# Patient Record
Sex: Male | Born: 1975 | Race: Black or African American | Hispanic: No | Marital: Single | Smoking: Never smoker
Health system: Southern US, Community
[De-identification: ages and names within clinical notes are randomized; demographics above are authoritative.]

## PROBLEM LIST (undated history)

## (undated) DIAGNOSIS — B20 Human immunodeficiency virus [HIV] disease: Secondary | ICD-10-CM

## (undated) DIAGNOSIS — F1910 Other psychoactive substance abuse, uncomplicated: Secondary | ICD-10-CM

## (undated) DIAGNOSIS — F32A Depression, unspecified: Secondary | ICD-10-CM

## (undated) DIAGNOSIS — F329 Major depressive disorder, single episode, unspecified: Secondary | ICD-10-CM

## (undated) HISTORY — DX: Other psychoactive substance abuse, uncomplicated: F19.10

## (undated) HISTORY — DX: Major depressive disorder, single episode, unspecified: F32.9

## (undated) HISTORY — DX: Depression, unspecified: F32.A

---

## 1997-12-03 ENCOUNTER — Encounter: Admission: RE | Admit: 1997-12-03 | Discharge: 1997-12-03 | Payer: Self-pay | Admitting: Internal Medicine

## 1997-12-23 ENCOUNTER — Encounter: Admission: RE | Admit: 1997-12-23 | Discharge: 1997-12-23 | Payer: Self-pay | Admitting: Internal Medicine

## 1998-01-25 ENCOUNTER — Emergency Department (HOSPITAL_COMMUNITY): Admission: EM | Admit: 1998-01-25 | Discharge: 1998-01-25 | Payer: Self-pay | Admitting: Emergency Medicine

## 1998-02-05 ENCOUNTER — Encounter: Admission: RE | Admit: 1998-02-05 | Discharge: 1998-02-05 | Payer: Self-pay | Admitting: Internal Medicine

## 1998-02-27 ENCOUNTER — Encounter (INDEPENDENT_AMBULATORY_CARE_PROVIDER_SITE_OTHER): Payer: Self-pay | Admitting: *Deleted

## 1998-02-27 LAB — CONVERTED CEMR LAB: CD4 Count: 20 microliters

## 1998-03-10 ENCOUNTER — Encounter: Admission: RE | Admit: 1998-03-10 | Discharge: 1998-03-10 | Payer: Self-pay | Admitting: Internal Medicine

## 1998-05-01 ENCOUNTER — Emergency Department (HOSPITAL_COMMUNITY): Admission: EM | Admit: 1998-05-01 | Discharge: 1998-05-01 | Payer: Self-pay | Admitting: Emergency Medicine

## 1998-06-03 ENCOUNTER — Ambulatory Visit (HOSPITAL_COMMUNITY): Admission: RE | Admit: 1998-06-03 | Discharge: 1998-06-03 | Payer: Self-pay | Admitting: Internal Medicine

## 1998-07-14 ENCOUNTER — Encounter: Admission: RE | Admit: 1998-07-14 | Discharge: 1998-07-14 | Payer: Self-pay | Admitting: Internal Medicine

## 1998-09-29 ENCOUNTER — Ambulatory Visit (HOSPITAL_COMMUNITY): Admission: RE | Admit: 1998-09-29 | Discharge: 1998-09-29 | Payer: Self-pay | Admitting: Internal Medicine

## 1998-09-29 ENCOUNTER — Encounter: Admission: RE | Admit: 1998-09-29 | Discharge: 1998-09-29 | Payer: Self-pay | Admitting: Internal Medicine

## 1998-10-21 ENCOUNTER — Encounter: Admission: RE | Admit: 1998-10-21 | Discharge: 1998-10-21 | Payer: Self-pay | Admitting: Internal Medicine

## 1998-12-29 ENCOUNTER — Ambulatory Visit (HOSPITAL_COMMUNITY): Admission: RE | Admit: 1998-12-29 | Discharge: 1998-12-29 | Payer: Self-pay | Admitting: Internal Medicine

## 1999-01-12 ENCOUNTER — Encounter: Admission: RE | Admit: 1999-01-12 | Discharge: 1999-01-12 | Payer: Self-pay | Admitting: Internal Medicine

## 1999-03-24 ENCOUNTER — Ambulatory Visit (HOSPITAL_COMMUNITY): Admission: RE | Admit: 1999-03-24 | Discharge: 1999-03-24 | Payer: Self-pay | Admitting: Internal Medicine

## 1999-03-24 ENCOUNTER — Encounter: Admission: RE | Admit: 1999-03-24 | Discharge: 1999-03-24 | Payer: Self-pay | Admitting: Internal Medicine

## 1999-04-07 ENCOUNTER — Encounter: Admission: RE | Admit: 1999-04-07 | Discharge: 1999-04-07 | Payer: Self-pay | Admitting: Internal Medicine

## 1999-08-22 ENCOUNTER — Emergency Department (HOSPITAL_COMMUNITY): Admission: EM | Admit: 1999-08-22 | Discharge: 1999-08-22 | Payer: Self-pay | Admitting: Emergency Medicine

## 1999-08-25 ENCOUNTER — Encounter: Admission: RE | Admit: 1999-08-25 | Discharge: 1999-08-25 | Payer: Self-pay | Admitting: Internal Medicine

## 1999-08-25 ENCOUNTER — Ambulatory Visit (HOSPITAL_COMMUNITY): Admission: RE | Admit: 1999-08-25 | Discharge: 1999-08-25 | Payer: Self-pay | Admitting: Internal Medicine

## 1999-09-08 ENCOUNTER — Encounter: Admission: RE | Admit: 1999-09-08 | Discharge: 1999-09-08 | Payer: Self-pay | Admitting: Internal Medicine

## 2000-03-01 ENCOUNTER — Encounter: Admission: RE | Admit: 2000-03-01 | Discharge: 2000-03-01 | Payer: Self-pay | Admitting: Internal Medicine

## 2000-03-01 ENCOUNTER — Ambulatory Visit (HOSPITAL_COMMUNITY): Admission: RE | Admit: 2000-03-01 | Discharge: 2000-03-01 | Payer: Self-pay | Admitting: Internal Medicine

## 2000-03-03 ENCOUNTER — Ambulatory Visit (HOSPITAL_COMMUNITY): Admission: RE | Admit: 2000-03-03 | Discharge: 2000-03-03 | Payer: Self-pay | Admitting: Internal Medicine

## 2000-03-03 ENCOUNTER — Encounter: Admission: RE | Admit: 2000-03-03 | Discharge: 2000-03-03 | Payer: Self-pay | Admitting: Internal Medicine

## 2000-03-21 ENCOUNTER — Encounter: Admission: RE | Admit: 2000-03-21 | Discharge: 2000-03-21 | Payer: Self-pay | Admitting: Internal Medicine

## 2001-03-06 ENCOUNTER — Ambulatory Visit (HOSPITAL_COMMUNITY): Admission: RE | Admit: 2001-03-06 | Discharge: 2001-03-06 | Payer: Self-pay | Admitting: Internal Medicine

## 2001-03-06 ENCOUNTER — Encounter: Admission: RE | Admit: 2001-03-06 | Discharge: 2001-03-06 | Payer: Self-pay | Admitting: Internal Medicine

## 2001-03-28 ENCOUNTER — Encounter: Admission: RE | Admit: 2001-03-28 | Discharge: 2001-03-28 | Payer: Self-pay | Admitting: Internal Medicine

## 2001-09-04 ENCOUNTER — Ambulatory Visit (HOSPITAL_COMMUNITY): Admission: RE | Admit: 2001-09-04 | Discharge: 2001-09-04 | Payer: Self-pay | Admitting: Internal Medicine

## 2001-09-04 ENCOUNTER — Encounter: Admission: RE | Admit: 2001-09-04 | Discharge: 2001-09-04 | Payer: Self-pay | Admitting: Internal Medicine

## 2001-10-16 ENCOUNTER — Emergency Department (HOSPITAL_COMMUNITY): Admission: EM | Admit: 2001-10-16 | Discharge: 2001-10-16 | Payer: Self-pay

## 2002-03-10 ENCOUNTER — Encounter: Payer: Self-pay | Admitting: Emergency Medicine

## 2002-03-10 ENCOUNTER — Emergency Department (HOSPITAL_COMMUNITY): Admission: EM | Admit: 2002-03-10 | Discharge: 2002-03-10 | Payer: Self-pay | Admitting: Emergency Medicine

## 2002-08-07 ENCOUNTER — Encounter: Admission: RE | Admit: 2002-08-07 | Discharge: 2002-08-07 | Payer: Self-pay | Admitting: Internal Medicine

## 2002-08-07 ENCOUNTER — Ambulatory Visit (HOSPITAL_COMMUNITY): Admission: RE | Admit: 2002-08-07 | Discharge: 2002-08-07 | Payer: Self-pay | Admitting: Internal Medicine

## 2002-08-30 ENCOUNTER — Encounter: Payer: Self-pay | Admitting: Emergency Medicine

## 2002-08-30 ENCOUNTER — Emergency Department (HOSPITAL_COMMUNITY): Admission: AC | Admit: 2002-08-30 | Discharge: 2002-08-30 | Payer: Self-pay

## 2002-12-29 ENCOUNTER — Encounter: Payer: Self-pay | Admitting: Emergency Medicine

## 2002-12-29 ENCOUNTER — Emergency Department (HOSPITAL_COMMUNITY): Admission: EM | Admit: 2002-12-29 | Discharge: 2002-12-29 | Payer: Self-pay | Admitting: Emergency Medicine

## 2003-09-12 ENCOUNTER — Encounter: Admission: RE | Admit: 2003-09-12 | Discharge: 2003-09-12 | Payer: Self-pay | Admitting: Internal Medicine

## 2003-09-24 ENCOUNTER — Encounter: Admission: RE | Admit: 2003-09-24 | Discharge: 2003-09-24 | Payer: Self-pay | Admitting: Internal Medicine

## 2003-10-22 ENCOUNTER — Encounter: Admission: RE | Admit: 2003-10-22 | Discharge: 2003-10-22 | Payer: Self-pay | Admitting: Internal Medicine

## 2004-01-07 ENCOUNTER — Encounter: Admission: RE | Admit: 2004-01-07 | Discharge: 2004-01-07 | Payer: Self-pay | Admitting: Internal Medicine

## 2004-01-21 ENCOUNTER — Encounter: Admission: RE | Admit: 2004-01-21 | Discharge: 2004-01-21 | Payer: Self-pay | Admitting: Internal Medicine

## 2004-09-14 ENCOUNTER — Ambulatory Visit (HOSPITAL_COMMUNITY): Admission: RE | Admit: 2004-09-14 | Discharge: 2004-09-14 | Payer: Self-pay | Admitting: Internal Medicine

## 2004-09-14 ENCOUNTER — Encounter (INDEPENDENT_AMBULATORY_CARE_PROVIDER_SITE_OTHER): Payer: Self-pay | Admitting: *Deleted

## 2004-09-14 ENCOUNTER — Ambulatory Visit: Payer: Self-pay | Admitting: Internal Medicine

## 2004-09-14 LAB — CONVERTED CEMR LAB: HIV 1 RNA Quant: 399 copies/mL

## 2004-09-28 ENCOUNTER — Ambulatory Visit: Payer: Self-pay | Admitting: Internal Medicine

## 2004-11-11 ENCOUNTER — Emergency Department (HOSPITAL_COMMUNITY): Admission: EM | Admit: 2004-11-11 | Discharge: 2004-11-11 | Payer: Self-pay | Admitting: General Surgery

## 2005-03-30 ENCOUNTER — Ambulatory Visit: Payer: Self-pay | Admitting: Internal Medicine

## 2005-03-30 ENCOUNTER — Ambulatory Visit (HOSPITAL_COMMUNITY): Admission: RE | Admit: 2005-03-30 | Discharge: 2005-03-30 | Payer: Self-pay | Admitting: Internal Medicine

## 2005-03-30 ENCOUNTER — Encounter (INDEPENDENT_AMBULATORY_CARE_PROVIDER_SITE_OTHER): Payer: Self-pay | Admitting: *Deleted

## 2005-04-13 ENCOUNTER — Ambulatory Visit: Payer: Self-pay | Admitting: Internal Medicine

## 2006-09-16 ENCOUNTER — Encounter (INDEPENDENT_AMBULATORY_CARE_PROVIDER_SITE_OTHER): Payer: Self-pay | Admitting: *Deleted

## 2006-09-16 ENCOUNTER — Encounter: Admission: RE | Admit: 2006-09-16 | Discharge: 2006-09-16 | Payer: Self-pay | Admitting: Internal Medicine

## 2006-09-16 ENCOUNTER — Ambulatory Visit: Payer: Self-pay | Admitting: Internal Medicine

## 2006-09-16 LAB — CONVERTED CEMR LAB
AST: 24 units/L (ref 0–37)
Albumin: 4.5 g/dL (ref 3.5–5.2)
Alkaline Phosphatase: 50 units/L (ref 39–117)
BUN: 14 mg/dL (ref 6–23)
Basophils Relative: 0 % (ref 0–1)
CD4 Count: 160 microliters
Creatinine, Ser: 0.9 mg/dL (ref 0.40–1.50)
Eosinophils Relative: 1 % (ref 0–5)
Glucose, Bld: 92 mg/dL (ref 70–99)
HCT: 42.1 % (ref 39.0–52.0)
HIV 1 RNA Quant: 135 copies/mL — ABNORMAL HIGH (ref <50)
HIV-1 RNA Quant, Log: 2.13 — ABNORMAL HIGH (ref <1.70)
Hemoglobin: 14.3 g/dL (ref 13.0–17.0)
Lymphocytes Relative: 65 % — ABNORMAL HIGH (ref 12–46)
Lymphs Abs: 2.2 10*3/uL (ref 0.7–3.3)
MCV: 107.9 fL — ABNORMAL HIGH (ref 78.0–100.0)
Monocytes Absolute: 0.4 10*3/uL (ref 0.2–0.7)
Monocytes Relative: 13 % — ABNORMAL HIGH (ref 3–11)
Platelets: 364 10*3/uL (ref 150–400)
Potassium: 4.6 meq/L (ref 3.5–5.3)
RBC: 3.9 M/uL — ABNORMAL LOW (ref 4.22–5.81)
WBC: 3.4 10*3/uL — ABNORMAL LOW (ref 4.0–10.5)

## 2006-10-17 DIAGNOSIS — F17200 Nicotine dependence, unspecified, uncomplicated: Secondary | ICD-10-CM | POA: Insufficient documentation

## 2006-10-17 DIAGNOSIS — F1010 Alcohol abuse, uncomplicated: Secondary | ICD-10-CM | POA: Insufficient documentation

## 2006-10-17 DIAGNOSIS — A150 Tuberculosis of lung: Secondary | ICD-10-CM | POA: Insufficient documentation

## 2006-10-17 DIAGNOSIS — A318 Other mycobacterial infections: Secondary | ICD-10-CM | POA: Insufficient documentation

## 2006-10-17 DIAGNOSIS — F411 Generalized anxiety disorder: Secondary | ICD-10-CM | POA: Insufficient documentation

## 2006-10-17 DIAGNOSIS — B20 Human immunodeficiency virus [HIV] disease: Secondary | ICD-10-CM | POA: Insufficient documentation

## 2006-10-17 DIAGNOSIS — F329 Major depressive disorder, single episode, unspecified: Secondary | ICD-10-CM

## 2006-10-24 ENCOUNTER — Encounter (INDEPENDENT_AMBULATORY_CARE_PROVIDER_SITE_OTHER): Payer: Self-pay | Admitting: *Deleted

## 2006-10-24 LAB — CONVERTED CEMR LAB

## 2006-10-25 ENCOUNTER — Ambulatory Visit: Payer: Self-pay | Admitting: Internal Medicine

## 2006-11-06 ENCOUNTER — Encounter (INDEPENDENT_AMBULATORY_CARE_PROVIDER_SITE_OTHER): Payer: Self-pay | Admitting: *Deleted

## 2006-11-07 ENCOUNTER — Telehealth (INDEPENDENT_AMBULATORY_CARE_PROVIDER_SITE_OTHER): Payer: Self-pay | Admitting: Infectious Diseases

## 2007-02-01 ENCOUNTER — Telehealth: Payer: Self-pay | Admitting: Internal Medicine

## 2007-03-23 ENCOUNTER — Telehealth: Payer: Self-pay | Admitting: Internal Medicine

## 2007-04-04 ENCOUNTER — Telehealth: Payer: Self-pay | Admitting: Internal Medicine

## 2007-05-08 ENCOUNTER — Encounter: Admission: RE | Admit: 2007-05-08 | Discharge: 2007-05-08 | Payer: Self-pay | Admitting: Internal Medicine

## 2007-05-08 ENCOUNTER — Ambulatory Visit: Payer: Self-pay | Admitting: Internal Medicine

## 2007-05-08 LAB — CONVERTED CEMR LAB
Cholesterol: 191 mg/dL (ref 0–200)
HDL: 91 mg/dL (ref >39)
HIV 1 RNA Quant: 313 copies/mL — ABNORMAL HIGH (ref <50)
Total CHOL/HDL Ratio: 2.1
VLDL: 24 mg/dL (ref 0–40)

## 2007-05-22 ENCOUNTER — Ambulatory Visit: Payer: Self-pay | Admitting: Internal Medicine

## 2007-08-29 ENCOUNTER — Encounter: Payer: Self-pay | Admitting: Internal Medicine

## 2007-10-24 ENCOUNTER — Ambulatory Visit (HOSPITAL_COMMUNITY): Admission: RE | Admit: 2007-10-24 | Discharge: 2007-10-24 | Payer: Self-pay | Admitting: *Deleted

## 2008-04-05 ENCOUNTER — Ambulatory Visit: Payer: Self-pay | Admitting: Internal Medicine

## 2008-04-05 ENCOUNTER — Encounter: Admission: RE | Admit: 2008-04-05 | Discharge: 2008-04-05 | Payer: Self-pay | Admitting: Internal Medicine

## 2008-04-05 LAB — CONVERTED CEMR LAB
AST: 26 units/L (ref 0–37)
Alkaline Phosphatase: 51 units/L (ref 39–117)
BUN: 15 mg/dL (ref 6–23)
Creatinine, Ser: 1.01 mg/dL (ref 0.40–1.50)
HCT: 42.6 % (ref 39.0–52.0)
HDL: 80 mg/dL (ref >39)
Hemoglobin: 14.2 g/dL (ref 13.0–17.0)
LDL Cholesterol: 79 mg/dL (ref 0–99)
MCHC: 33.3 g/dL (ref 30.0–36.0)
MCV: 112.7 fL — ABNORMAL HIGH (ref 78.0–100.0)
Potassium: 4.1 meq/L (ref 3.5–5.3)
RDW: 14.2 % (ref 11.5–15.5)
Total Bilirubin: 0.5 mg/dL (ref 0.3–1.2)
Total CHOL/HDL Ratio: 2.3
Triglycerides: 104 mg/dL (ref <150)
VLDL: 21 mg/dL (ref 0–40)

## 2008-04-23 ENCOUNTER — Ambulatory Visit: Payer: Self-pay | Admitting: Internal Medicine

## 2008-05-13 ENCOUNTER — Telehealth: Payer: Self-pay | Admitting: Internal Medicine

## 2009-06-20 ENCOUNTER — Ambulatory Visit: Payer: Self-pay | Admitting: Internal Medicine

## 2009-06-20 LAB — CONVERTED CEMR LAB
ALT: 15 units/L (ref 0–53)
BUN: 16 mg/dL (ref 6–23)
Basophils Relative: 1 % (ref 0–1)
CO2: 22 meq/L (ref 19–32)
Calcium: 9.5 mg/dL (ref 8.4–10.5)
Chloride: 102 meq/L (ref 96–112)
Creatinine, Ser: 1.1 mg/dL (ref 0.40–1.50)
Eosinophils Absolute: 0 10*3/uL (ref 0.0–0.7)
Eosinophils Relative: 1 % (ref 0–5)
GC Probe Amp, Urine: NEGATIVE
HCT: 40.4 % (ref 39.0–52.0)
HIV 1 RNA Quant: 314 copies/mL — ABNORMAL HIGH (ref <48)
Hemoglobin, Urine: NEGATIVE
Leukocytes, UA: NEGATIVE
MCHC: 33.4 g/dL (ref 30.0–36.0)
MCV: 112.8 fL — ABNORMAL HIGH (ref >=78.0)
Neutrophils Relative %: 27 % — ABNORMAL LOW (ref 43–77)
Nitrite: NEGATIVE
Platelets: 291 10*3/uL (ref 150–400)
Protein, ur: NEGATIVE mg/dL
Total Bilirubin: 0.4 mg/dL (ref 0.3–1.2)
Urine Glucose: NEGATIVE mg/dL
Urobilinogen, UA: 0.2 (ref 0.0–1.0)

## 2009-07-01 ENCOUNTER — Ambulatory Visit: Payer: Self-pay | Admitting: Internal Medicine

## 2009-07-01 DIAGNOSIS — I10 Essential (primary) hypertension: Secondary | ICD-10-CM

## 2009-07-01 LAB — CONVERTED CEMR LAB
LDL Cholesterol: 100 mg/dL — ABNORMAL HIGH (ref 0–99)
Triglycerides: 124 mg/dL (ref <150)
VLDL: 25 mg/dL (ref 0–40)

## 2009-12-24 ENCOUNTER — Telehealth (INDEPENDENT_AMBULATORY_CARE_PROVIDER_SITE_OTHER): Payer: Self-pay | Admitting: *Deleted

## 2010-01-06 ENCOUNTER — Telehealth: Payer: Self-pay | Admitting: Internal Medicine

## 2010-01-16 ENCOUNTER — Encounter (INDEPENDENT_AMBULATORY_CARE_PROVIDER_SITE_OTHER): Payer: Self-pay | Admitting: *Deleted

## 2010-03-10 ENCOUNTER — Telehealth (INDEPENDENT_AMBULATORY_CARE_PROVIDER_SITE_OTHER): Payer: Self-pay | Admitting: *Deleted

## 2010-04-24 ENCOUNTER — Ambulatory Visit: Payer: Self-pay | Admitting: Internal Medicine

## 2010-04-24 LAB — CONVERTED CEMR LAB
AST: 45 units/L — ABNORMAL HIGH (ref 0–37)
Albumin: 4.1 g/dL (ref 3.5–5.2)
BUN: 10 mg/dL (ref 6–23)
Basophils Relative: 0 % (ref 0–1)
CO2: 25 meq/L (ref 19–32)
Calcium: 9.1 mg/dL (ref 8.4–10.5)
Chloride: 99 meq/L (ref 96–112)
Cholesterol: 192 mg/dL (ref 0–200)
Creatinine, Ser: 0.98 mg/dL (ref 0.40–1.50)
HDL: 80 mg/dL (ref >39)
HIV 1 RNA Quant: 12800 copies/mL — ABNORMAL HIGH (ref <48)
Hemoglobin: 14.4 g/dL (ref 13.0–17.0)
Lymphocytes Relative: 54 % — ABNORMAL HIGH (ref 12–46)
Lymphs Abs: 1.9 10*3/uL (ref 0.7–4.0)
MCHC: 34.6 g/dL (ref 30.0–36.0)
Monocytes Absolute: 0.6 10*3/uL (ref 0.1–1.0)
Monocytes Relative: 16 % — ABNORMAL HIGH (ref 3–12)
Neutro Abs: 1 10*3/uL — ABNORMAL LOW (ref 1.7–7.7)
Neutrophils Relative %: 29 % — ABNORMAL LOW (ref 43–77)
Potassium: 3.8 meq/L (ref 3.5–5.3)
RBC: 3.91 M/uL — ABNORMAL LOW (ref 4.22–5.81)
WBC: 3.5 10*3/uL — ABNORMAL LOW (ref 4.0–10.5)

## 2010-05-06 ENCOUNTER — Ambulatory Visit: Payer: Self-pay | Admitting: Internal Medicine

## 2010-05-06 LAB — CONVERTED CEMR LAB

## 2010-05-19 ENCOUNTER — Encounter: Payer: Self-pay | Admitting: Internal Medicine

## 2010-05-25 ENCOUNTER — Ambulatory Visit: Payer: Self-pay | Admitting: Internal Medicine

## 2010-07-20 ENCOUNTER — Encounter (INDEPENDENT_AMBULATORY_CARE_PROVIDER_SITE_OTHER): Payer: Self-pay | Admitting: *Deleted

## 2010-07-31 ENCOUNTER — Ambulatory Visit: Payer: Self-pay | Admitting: Internal Medicine

## 2010-08-13 ENCOUNTER — Ambulatory Visit: Payer: Self-pay | Admitting: Internal Medicine

## 2010-09-29 NOTE — Progress Notes (Signed)
Summary: Harriette Ohara to call pt.  Phone Note Call from Patient   Caller: Patient Summary of Call: Patient feeling sick and wants Dr. Orvan Falconer to call him. Patient asked for samples of his medications until he can come in to complete ADAP application. patien was told that [facility] does not have any Viracept and that it is not good to take one without the other.  Patient wants to know what to do in the meantime.  He has been out-of-meds for 2 months.  He was told what documents to bring in order to reenroll into the program.  Waiting on documentation. Initial call taken by: Paulo Fruit  BS,CPht II,MPH,  Jan 06, 2010 4:45 PM  Follow-up for Phone Call        I went into the stock room and did find samples of the Viracept.  If you want him to continue taking the Viracept and Combivir; I do have samples to give him. Follow-up by: Paulo Fruit  BS,CPht II,MPH,  Jan 06, 2010 4:48 PM  Additional Follow-up for Phone Call Additional follow up Details #1::        Please ask if he wants to be seen before his 5/24 appointment with me. If so, try to work him in with Dr. Philipp Deputy. Additional Follow-up by: Cliffton Asters MD,  Jan 08, 2010 9:13 AM     Appended Document: Harriette Ohara to call pt. tried to contact patient. Had to leave a message for patient to call me back at [phone]

## 2010-09-29 NOTE — Miscellaneous (Signed)
Summary: clinical update/ryan white [facility] app completed  Clinical Lists Changes  Observations: Added new observation of RWTITLE: B (01/16/2010 16:17) Added new observation of PAYOR: No Insurance (01/16/2010 16:17) Added new observation of AIDSDAP: Pending (01/16/2010 16:17) Added new observation of INCOMESOURCE: NONE (01/16/2010 16:17) Added new observation of HOUSEINCOME: 0  (01/16/2010 16:17) Added new observation of FINASSESSDT: 01/16/2010  (01/16/2010 16:17) Added new observation of RW VITAL STA: Active  (01/16/2010 16:17)

## 2010-09-29 NOTE — Miscellaneous (Signed)
  Clinical Lists Changes  Observations: Added new observation of YEARAIDSPOS: 2003  (07/20/2010 11:08)

## 2010-09-29 NOTE — Miscellaneous (Signed)
Summary: RW Form Update  Clinical Lists Changes  Observations: Added new observation of LATINO/HISP: Yes (05/19/2010 9:54)

## 2010-09-29 NOTE — Assessment & Plan Note (Signed)
Summary: 3WKS F/U [MKJ]   CC:  follow-up visit.  History of Present Illness: Darryl Berg is in for his follow-up visit.  He says that he has been taking his Combivir, Viracept and hydrochlorothiazide since his last visit.  He denies missing any doses.  He tells me that his girlfriend wants to get pregnant.  He says that she has tested negative for HIV repeatedly when she has donated blood.  He says that he has had unprotected sex with her but would feel "like I committed murder" if she became infected.  Preventive Screening-Counseling & Management  Alcohol-Tobacco     Alcohol drinks/day: <1     Alcohol type: beer and hard liquer     Smoking Status: never     Passive Smoke Exposure: yes  Caffeine-Diet-Exercise     Caffeine use/day: yes     Does Patient Exercise: no     Type of exercise: tennis     Exercise (avg: min/session): >60     Times/week: 3  Hep-HIV-STD-Contraception     HIV Risk: no risk noted  Safety-Violence-Falls     Seat Belt Use: 100  Comments: declined condoms      Sexual History:  currently monogamous.        Drug Use:  never.     Updated Prior Medication List: has not received Septra yet Current Allergies (reviewed today): No known allergies  Vital Signs:  Patient profile:   35 year old male Height:      66 inches (167.64 cm) Weight:      159.75 pounds (72.61 kg) BMI:     25.88 Temp:     98.4 degrees F (36.89 degrees C) oral Pulse rate:   96 / minute BP sitting:   146 / 101  (left arm) Cuff size:   regular  Vitals Entered By: Jennet Maduro RN (May 25, 2010 3:08 PM) CC: follow-up visit Is Patient Diabetic? No Pain Assessment Patient in pain? no      Nutritional Status BMI of 19 -24 = normal Nutritional Status Detail appetite "too good"  Have you ever been in a relationship where you felt threatened, hurt or afraid?No   Does patient need assistance? Functional Status Self care Ambulation Normal Comments no missed doses of HIV  rxes   Physical Exam  General:  alert and well-nourished.   Mouth:  good dentition and pharynx pink and moist.   Lungs:  normal breath sounds.  no crackles and no wheezes.   Heart:  normal rate, regular rhythm, and no murmur.   Skin:  no rashes.   Axillary Nodes:  no R axillary adenopathy and no L axillary adenopathy.   Psych:  normally interactive, good eye contact, and not depressed appearing but slightly anxious and worried.          Medication Adherence: 05/25/2010   Adherence to medications reviewed with patient. Counseling to provide adequate adherence provided   Prevention For Positives: 05/25/2010   Safe sex practices discussed with patient. Condoms offered.                              Impression & Recommendations:  Problem # 1:  HIV DISEASE (ICD-042) I have reviewed with him again the need to switch regimens but due to his evolving resistance.  It is possible that he has NNRTI resistance that is not currently been manifested by his virus but I will try a regimen of Atripla and Retrovir. I  discussed the risk of HIV transmission to his girlfriend unprotected sex.  That is a particular concern now that his viral load has increased.  If she does become infected it will be with his resistant virus.  I have suggested that he not have unprotected sex now and talk to his girlfriend about this.  Have also suggested that she be tested regularly at a doctor's office and not to donate blood. His updated medication list for this problem includes:    Septra Ds 800-160 Mg Tabs (Sulfamethoxazole-trimethoprim) .Marland Kitchen... 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  I will add diltiazem. His updated medication list for this problem includes:    Hydrochlorothiazide 50 Mg Tabs (Hydrochlorothiazide) .Marland Kitchen... Take 1 tablet by mouth once a day    Diltiazem Hcl 60 Mg Tabs (Diltiazem hcl) .Marland Kitchen... Take 1 tablet by mouth two times a day  Orders: Est. Patient Level IV (45409)  Medications Added to Medication List This Visit: 1)  Atripla 600-200-300 Mg Tabs (Efavirenz-emtricitab-tenofovir) .... Take 1 tablet by mouth once a day 2)  Retrovir 300 Mg Tabs (Zidovudine) .... Take 1 tablet by mouth twice a day 3)  Diltiazem Hcl 60 Mg Tabs (Diltiazem hcl) .... Take 1 tablet by mouth two times a day  Other Orders: Future Orders: T-CD4SP ([facility]) (CD4SP) ... 07/06/2010 T-HIV Viral Load [phone]) ... 07/06/2010  Patient Instructions: 1)  Please schedule a follow-up appointment in 2 months. Prescriptions: DILTIAZEM HCL 60 MG TABS (DILTIAZEM HCL) Take 1 tablet by mouth two times a day  #60 x 11   Entered and Authorized by:   Cliffton Asters MD   Signed by:   Cliffton Asters MD on 05/25/2010   Method used:   Print then Give to Patient   RxID:   [id number] RETROVIR 300 MG TABS (ZIDOVUDINE) Take 1 tablet by mouth twice a day  #60 x 11   Entered and Authorized by:   Cliffton Asters MD   Signed by:   Cliffton Asters MD on 05/25/2010   Method used:   Print then Give to Patient   RxID:   [id number] ATRIPLA 600-200-300 MG TABS (EFAVIRENZ-EMTRICITAB-TENOFOVIR) Take 1 tablet by mouth once a day  #30 x 11   Entered and Authorized by:   Cliffton Asters MD   Signed by:   Cliffton Asters MD on 05/25/2010   Method used:   Print then Give to Patient   RxID:   [id number]

## 2010-09-29 NOTE — Miscellaneous (Signed)
Summary: Orders Update  Clinical Lists Changes  Orders: Added new Test order of T-Comprehensive Metabolic Panel [phone]) - Signed Added new Test order of T-CD4 [phone]) - Signed Added new Test order of T-CBC w/Diff [phone]) - Signed Added new Test order of T-Lipid Profile ([id number]) - Signed Added new Test order of T-HIV Viral Load [phone]) - Signed Added new Test order of T-Syphilis Test (RPR) ([id number]) - Signed

## 2010-09-29 NOTE — Progress Notes (Signed)
Summary: new scripts--[facility]  Phone Note From Pharmacy   Caller: [facility] [address] [facility] Details for Reason: Need new scripts Summary of Call: Received a phone call from [facility] requesting new prescriptions for patients 042 meds be called into the [facility] store.  His prescriptions were not transfered from [facility]. Initial call taken by: Paulo Fruit  BS,CPht II,MPH,  March 10, 2010 4:13 PM    Prescriptions: VIRACEPT 625 MG TABS (NELFINAVIR MESYLATE) Take 2 tablets by mouth two times a day  #120 Tablet x 2   Entered by:   Paulo Fruit  BS,CPht II,MPH   Authorized by:   Cliffton Asters MD   Signed by:   Paulo Fruit  BS,CPht II,MPH on 03/10/2010   Method used:   Telephoned to ...       [facility] [phone]* (retail)       [address]       [place], [place]  [address]       Ph: [phone]       Fax:    RxID:   [id number] COMBIVIR 150-300 MG TABS (LAMIVUDINE-ZIDOVUDINE) Take 1 tablet by mouth two times a day  #60 Tablet x 2   Entered by:   Paulo Fruit  BS,CPht II,MPH   Authorized by:   Cliffton Asters MD   Signed by:   Paulo Fruit  BS,CPht II,MPH on 03/10/2010   Method used:   Telephoned to ...       [facility] [phone]* (retail)       [address]       [place], [place]  [address]       Ph: [phone]       Fax:    RxID:   [id number]  Left message on physician pharmacy line. Paulo Fruit  BS,CPht II,MPH  March 10, 2010 4:17 PM

## 2010-09-29 NOTE — Progress Notes (Signed)
Summary: Assistance w/medication  Phone Note Call from Patient   Caller: Patient Details for Reason: lost insurance Summary of Call: Patient c/o lost insurance December 06, 2009.  Need to come in and reapply for NCADAP.  An appt has been scheduled for Tuesday, Dec 30, 2009 at 2pm. Initial call taken by: Paulo Fruit  BS,CPht II,MPH,  December 24, 2009 4:44 PM

## 2010-09-29 NOTE — Assessment & Plan Note (Signed)
Summary: reshfrom 7/12 f/u [mkj]   CC:  follow-up visit.  History of Present Illness: Darryl Berg is in for his first visit since last November.  He lost his job and her medical insurance in April and recalls being off of his HIV medicines for at least one month.  It appears he was able to obtain some samples from [facility] in May and new ADAP prescriptions in July. He  initially told me the he did not recall missing any doses but then says that yes on weekends he will frequently missed when he is out playing or gets up late.  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  He is sexually active with monogamous partner but says that he always uses condoms.  He is given more condoms today.  Preventive Screening-Counseling & Management  Alcohol-Tobacco     Alcohol drinks/day: <1     Alcohol type: beer and hard liquer     Smoking Status: never     Passive Smoke Exposure: yes  Caffeine-Diet-Exercise     Caffeine use/day: yes     Does Patient Exercise: no     Type of exercise: tennis     Exercise (avg: min/session): >60     Times/week: 3  Hep-HIV-STD-Contraception     HIV Risk: no risk noted  Safety-Violence-Falls     Seat Belt Use: 100      Sexual History:  currently monogamous.        Drug Use:  never.     Prior Medication List:  COMBIVIR 150-300 MG TABS (LAMIVUDINE-ZIDOVUDINE) Take 1 tablet by mouth two times a day VIRACEPT 625 MG TABS (NELFINAVIR MESYLATE) Take 2 tablets by mouth two times a day   Current Allergies (reviewed today): No known allergies  Social History: Sexual History:  currently monogamous  Vital Signs:  Patient profile:   35 year old male Height:      66 inches (167.64 cm) Weight:      160.5 pounds (72.95 kg) BMI:     26.00 Temp:     98.7 degrees F (37.06 degrees C) oral Pulse rate:   92 / minute BP sitting:   154 / 104  (left arm) Cuff  size:   regular  Vitals Entered By: Jennet Maduro RN (May 06, 2010 2:41 PM) CC: follow-up visit Is Patient Diabetic? No Pain Assessment Patient in pain? no      Nutritional Status BMI of 19 -24 = normal Nutritional Status Detail appetite "GOOD"  Have you ever been in a relationship where you felt threatened, hurt or afraid?No   Does patient need assistance? Functional Status Self care Ambulation Normal Comments MAY HAVE MISSED A FEW DOSES, PROBLEM AFTER BEIGN LAID OFF FROM WORK AND LOOSING INSURANCE   Physical Exam  General:  alert and well-nourished.   Mouth:  good dentition and pharynx pink and moist.   Lungs:  normal breath sounds.  no crackles and no wheezes.   Heart:  normal rate, regular rhythm, and no murmur.   Abdomen:  soft, non-tender, normal bowel sounds, and no masses.   Skin:  no rashes.   Axillary Nodes:  no R axillary adenopathy and no L axillary adenopathy.   Psych:  normally interactive, good eye contact, not anxious appearing, and not depressed appearing.      Impression & Recommendations:  Problem #  1:  HIV DISEASE (ICD-042) His load is now elevated and his CD4 count dropped below 200.  I suspect that he has been missing doses and has developed resistance.  I have ordered a genotype and will have him return soon to see if he needs a new salvage regimen.  He is to continue his current regimen with no misses until that time. I will restart PCP prophylaxis and he will get influenza vaccination today. 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 Take 1 tablet by mouth once a day  Problem # 2:  HYPERTENSION (ICD-401.9) His blood pressure  continues to rise.  I talked to him about trying to cut sodium out of his diet.  I will start him on hydrochlorothiazide today. His updated medication list for this problem includes:    Hydrochlorothiazide 50 Mg Tabs (Hydrochlorothiazide) .Marland Kitchen... Take 1 tablet by mouth once a day  Orders: T-HIV Genotype ([id number])  BP today: 154/104 Prior BP: 152/94 (07/01/2009)  Labs Reviewed: K+: 3.8 (04/24/2010) Creat: : 0.98 (04/24/2010)   Chol: 192 (04/24/2010)   HDL: 80 (04/24/2010)   LDL: 84 (04/24/2010)   TG: 141 (04/24/2010)  Medications Added to Medication List This Visit: 1)  Septra Ds 800-160 Mg Tabs (Sulfamethoxazole-trimethoprim) .... Take 1 tablet by mouth once a day 2)  Hydrochlorothiazide 50 Mg Tabs (Hydrochlorothiazide) .... Take 1 tablet by mouth once a day  Other Orders: Est. Patient Level IV (14782)  Patient Instructions: 1)  Please schedule a follow-up appointment in 3 weeks.  Prescriptions: HYDROCHLOROTHIAZIDE 50 MG TABS (HYDROCHLOROTHIAZIDE) Take 1 tablet by mouth once a day  #30 x 11   Entered and Authorized by:   Cliffton Asters MD   Signed by:   Cliffton Asters MD on 05/06/2010   Method used:   Print then Give to Patient   RxID:   [id number] SEPTRA DS 800-160 MG TABS (SULFAMETHOXAZOLE-TRIMETHOPRIM) Take 1 tablet by mouth once a day  #30 x 11   Entered and Authorized by:   Cliffton Asters MD   Signed by:   Cliffton Asters MD on 05/06/2010   Method used:   Print then Give to Patient   RxID:   [id number]   Appended Document: reshfrom 7/12 f/u - Flu vaccine   Influenza Vaccine    Vaccine Type: Fluvax Non-MCR    Site: left deltoid    Mfr: novartis    Dose: 0.5 ml    Route: IM    Given by: Jennet Maduro RN    Exp. Date: 11/29/2010    Lot #: [id number]  Flu Vaccine Consent Questions    Do you have a history of severe allergic reactions to this vaccine? no    Any prior history of allergic reactions to egg and/or gelatin? no    Do you have a sensitivity to the  preservative Thimersol? no    Do you have a past history of Guillan-Barre Syndrome? no    Do you currently have an acute febrile illness? no    Have you ever had a severe reaction to latex? no    Vaccine information given and explained to patient? yes

## 2010-10-23 ENCOUNTER — Other Ambulatory Visit (INDEPENDENT_AMBULATORY_CARE_PROVIDER_SITE_OTHER): Payer: Self-pay

## 2010-10-23 ENCOUNTER — Encounter: Payer: Self-pay | Admitting: Internal Medicine

## 2010-10-23 ENCOUNTER — Other Ambulatory Visit: Payer: Self-pay | Admitting: Internal Medicine

## 2010-10-23 DIAGNOSIS — B20 Human immunodeficiency virus [HIV] disease: Secondary | ICD-10-CM

## 2010-10-23 LAB — T-HELPER CELL (CD4) - (RCID CLINIC ONLY)
CD4 % Helper T Cell: 9 % — ABNORMAL LOW (ref 33–55)
CD4 T Cell Abs: 180 uL — ABNORMAL LOW (ref 400–2700)

## 2010-10-23 LAB — CONVERTED CEMR LAB
HIV 1 RNA Quant: 153 copies/mL — ABNORMAL HIGH (ref <20)
HIV-1 RNA Quant, Log: 2.18 — ABNORMAL HIGH (ref <1.30)

## 2010-11-10 ENCOUNTER — Ambulatory Visit (INDEPENDENT_AMBULATORY_CARE_PROVIDER_SITE_OTHER): Payer: Self-pay | Admitting: Internal Medicine

## 2010-11-10 ENCOUNTER — Encounter: Payer: Self-pay | Admitting: Internal Medicine

## 2010-11-10 DIAGNOSIS — B20 Human immunodeficiency virus [HIV] disease: Secondary | ICD-10-CM

## 2010-11-10 DIAGNOSIS — I10 Essential (primary) hypertension: Secondary | ICD-10-CM

## 2010-11-17 NOTE — Assessment & Plan Note (Addendum)
Summary: F/U   Vital Signs:  Patient profile:   35 year old male Height:      66 inches (167.64 cm) Weight:      151.75 pounds (68.98 kg) BMI:     24.58 Temp:     98.0 degrees F (36.67 degrees C) oral Resp:     90 per minute BP sitting:   143 / 86  (right arm) Cuff size:   regular  Vitals Entered By: Jennet Maduro RN (November 10, 2010 9:07 AM) CC: "cold" last week, no fever.  Has not taken his B/P rx this morning.  Admits to eatting a lot of "hot sauce."  Has lost 8 pounds since his last visit Is Patient Diabetic? No Pain Assessment Patient in pain? no      Nutritional Status BMI of 19 -24 = normal Nutritional Status Detail appetite better this week than last when he had a cold  Have you ever been in a relationship where you felt threatened, hurt or afraid?No   Does patient need assistance? Functional Status Self care Ambulation Normal Comments no missed doses of HIV rxes   CC:  "cold" last week and no fever.  Has not taken his B/P rx this morning.  Admits to eatting a lot of "hot sauce."  Has lost 8 pounds since his last visit.  History of Present Illness: Darryl Berg is in for his routine visit.  He states that he has been missing his Atripla frequently. For some reason he believed that he needed to take about 5 hours after eating so he would try to remember to wake himself up about 5 a.m. to take it.  He has not been using a pill box.  He and his girlfriend have still been sexually active without consistent use of condoms and she is now pregnant.  He thinks she was tested for HIV recently and was negative but is not certain.  Preventive Screening-Counseling & Management  Alcohol-Tobacco     Alcohol drinks/day: <1     Alcohol type: beer and hard liquer     Smoking Status: never     Passive Smoke Exposure: yes  Caffeine-Diet-Exercise     Caffeine use/day: yes     Does Patient Exercise: no     Type of exercise: tennis     Exercise (avg: min/session): >60  Times/week: 3  Hep-HIV-STD-Contraception     HIV Risk: no risk noted     HIV Risk Counseling: not indicated-no HIV risk noted  Safety-Violence-Falls     Seat Belt Use: 100  Comments: given condoms      Sexual History:  currently monogamous.        Drug Use:  never.    Current Medications (verified): 1)  Atripla 600-200-300 Mg Tabs (Efavirenz-Emtricitab-Tenofovir) .... Take 1 Tablet By Mouth Once A Day 2)  Retrovir 300 Mg Tabs (Zidovudine) .... Take 1 Tablet By Mouth Twice A Day 3)  Septra Ds 800-160 Mg Tabs (Sulfamethoxazole-Trimethoprim) .... Take 1 Tablet By Mouth Once A Day 4)  Hydrochlorothiazide 50 Mg Tabs (Hydrochlorothiazide) .... Take 1 Tablet By Mouth Once A Day 5)  Diltiazem Hcl 60 Mg Tabs (Diltiazem Hcl) .... Take 1 Tablet By Mouth Two Times A Day  Allergies: No Known Drug Allergies  Physical Exam  General:  alert and well-nourished.   Mouth:  good dentition and pharynx pink and moist.   Lungs:  normal breath sounds.  no crackles and no wheezes.   Heart:  normal rate, regular rhythm, and  no murmur.          Medication Adherence: 11/10/2010   Adherence to medications reviewed with patient. Counseling to provide adequate adherence provided   Prevention For Positives: 11/10/2010   Safe sex practices discussed with patient. Condoms offered.                             Impression & Recommendations:  Problem # 1:  HIV DISEASE (ICD-042) His infection is under better control but his adherence to his poor.  I will set him up to see the medication assistant coordinator.  I strongly encouraged him to use condoms consistently and to make sure that his girlfriend is tested for HIV regularly. His updated medication list for this problem includes:    Septra Ds 800-160 Mg Tabs (Sulfamethoxazole-trimethoprim) .Marland Kitchen... Take 1 tablet by mouth once a day  Other Orders: Est. Patient Level III ([address]) Future Orders: T-CD4SP ([facility]) (CD4SP) ... 02/08/2011 T-HIV Viral  Load [phone]) ... 02/08/2011 T-CBC w/Diff ([id number]) ... 02/08/2011 T-RPR (Syphilis) ([phone]) ... 02/08/2011 T-Lipid Profile [phone]) ... 02/08/2011  Patient Instructions: 1)  Please schedule a follow-up appointment in 3 months.    Orders Added: 1)  T-CD4SP [facility]) [CD4SP] 2)  T-HIV Viral Load [phone] 3)  T-CBC w/Diff [[id number]] 4)  T-RPR (Syphilis) [[id number]] 5)  T-Lipid Profile [[id number]] 6)  Est. Patient Level III [43329]          Medication Adherence: 11/10/2010   Adherence to medications reviewed with patient. Counseling to provide adequate adherence provided    Prevention For Positives: 11/10/2010   Safe sex practices discussed with patient. Condoms offered.                            Appended Document: F/U, [facility] referral

## 2010-12-03 LAB — T-HELPER CELL (CD4) - (RCID CLINIC ONLY): CD4 % Helper T Cell: 10 % — ABNORMAL LOW (ref 33–55)

## 2010-12-14 ENCOUNTER — Ambulatory Visit: Payer: Self-pay

## 2010-12-14 ENCOUNTER — Telehealth: Payer: Self-pay | Admitting: Internal Medicine

## 2010-12-14 NOTE — Telephone Encounter (Signed)
See my visit note from August of 2009. Unfortunately, Darryl Berg has not always been forthcoming about his insurance and financial status. Several years ago he was getting medications through ADAP while he had [employer]. If he has unable or unwilling to consciously disclose his financial status to [place] we will not be able to help him with his medications.

## 2010-12-14 NOTE — Telephone Encounter (Signed)
Message copied by Cliffton Asters on Mon Dec 14, 2010  4:26 PM ------      Message from: POFF, KIM      Created: Mon Dec 14, 2010  2:20 PM      Regarding: Patient       We are having issues getting Darryl Berg's ADAP renewed. He met with Darryl Berg and told her he is not working and when asked for his wife's income, he told her he was separated and he could not get that info. Then he stated that he was moving out next week anyway?? He told me he is working 2nd shift. And isn't his girlfriend pregnant? Just wondering if you had any insight on this patient. Is there a Hx of manipulation? Anything that would cause him to tell different stories? He is almost out of meds and we cannot get him back on ADAP without honest financial information. Suggestions?            Thanks,      [employer]

## 2011-01-27 ENCOUNTER — Other Ambulatory Visit: Payer: Self-pay | Admitting: *Deleted

## 2011-01-27 DIAGNOSIS — B20 Human immunodeficiency virus [HIV] disease: Secondary | ICD-10-CM

## 2011-01-27 MED ORDER — SULFAMETHOXAZOLE-TRIMETHOPRIM 800-160 MG PO TABS: 1.0000 | ORAL_TABLET | Freq: Every day | ORAL | Status: DC

## 2011-01-27 MED ORDER — ZIDOVUDINE 300 MG PO TABS: 300.0000 mg | ORAL_TABLET | Freq: Two times a day (BID) | ORAL | Status: DC

## 2011-01-27 MED ORDER — EFAVIRENZ-EMTRICITAB-TENOFOVIR 600-200-300 MG PO TABS: 1.0000 | ORAL_TABLET | Freq: Every day | ORAL | Status: DC

## 2011-02-01 ENCOUNTER — Emergency Department (HOSPITAL_COMMUNITY)
Admission: EM | Admit: 2011-02-01 | Discharge: 2011-02-02 | Discharge status: Against Medical Advice | Payer: Self-pay | Attending: Emergency Medicine | Admitting: Emergency Medicine

## 2011-02-01 DIAGNOSIS — R112 Nausea with vomiting, unspecified: Secondary | ICD-10-CM | POA: Insufficient documentation

## 2011-02-09 ENCOUNTER — Other Ambulatory Visit: Payer: Self-pay

## 2011-02-11 ENCOUNTER — Other Ambulatory Visit (INDEPENDENT_AMBULATORY_CARE_PROVIDER_SITE_OTHER): Payer: Self-pay

## 2011-02-11 DIAGNOSIS — Z79899 Other long term (current) drug therapy: Secondary | ICD-10-CM

## 2011-02-11 DIAGNOSIS — B20 Human immunodeficiency virus [HIV] disease: Secondary | ICD-10-CM

## 2011-02-11 DIAGNOSIS — Z113 Encounter for screening for infections with a predominantly sexual mode of transmission: Secondary | ICD-10-CM

## 2011-02-11 LAB — RPR

## 2011-02-12 LAB — CBC WITH DIFFERENTIAL/PLATELET
Hemoglobin: 13.8 g/dL (ref 13.0–17.0)
Lymphocytes Relative: 56 % — ABNORMAL HIGH (ref 12–46)
Lymphs Abs: 2.1 10*3/uL (ref 0.7–4.0)
Neutro Abs: 1 10*3/uL — ABNORMAL LOW (ref 1.7–7.7)
Neutrophils Relative %: 27 % — ABNORMAL LOW (ref 43–77)
Platelets: 265 10*3/uL (ref 150–400)
RBC: 3.66 MIL/uL — ABNORMAL LOW (ref 4.22–5.81)
WBC: 3.7 10*3/uL — ABNORMAL LOW (ref 4.0–10.5)

## 2011-02-12 LAB — LIPID PANEL
Cholesterol: 193 mg/dL (ref 0–200)
HDL: 77 mg/dL (ref >39)
Triglycerides: 106 mg/dL (ref <150)

## 2011-02-12 LAB — T-HELPER CELL (CD4) - (RCID CLINIC ONLY): CD4 T Cell Abs: 230 uL — ABNORMAL LOW (ref 400–2700)

## 2011-02-13 LAB — HIV-1 RNA QUANT-NO REFLEX-BLD: HIV 1 RNA Quant: 77 copies/mL — ABNORMAL HIGH (ref <20)

## 2011-02-23 ENCOUNTER — Ambulatory Visit (INDEPENDENT_AMBULATORY_CARE_PROVIDER_SITE_OTHER): Payer: Self-pay | Admitting: Internal Medicine

## 2011-02-23 ENCOUNTER — Encounter: Payer: Self-pay | Admitting: Internal Medicine

## 2011-02-23 VITALS — BP 142/83 | HR 98 | Temp 98.1°F | Ht 66.0 in | Wt 144.8 lb

## 2011-02-23 DIAGNOSIS — B20 Human immunodeficiency virus [HIV] disease: Secondary | ICD-10-CM

## 2011-02-23 NOTE — Assessment & Plan Note (Signed)
His adherence is better since starting to work with the Paramedic. His infection is under better control. I will continue his current regimen and see him back after lab work in 4 months.

## 2011-02-23 NOTE — Progress Notes (Signed)
  Subjective:    Patient ID: Darryl Berg, male    DOB: 02-Sep-1975, 35 y.o.   MRN: [id number]  HPI JP is in for his routine visit. He lost his ADAP coverage several months ago and says that he was out of all his medications for the month of April. Fortunately it was reinstated and he is now back on everything. He has been working with the Paramedic. He is using a pillbox and says he has not missed any of his medications since they were refilled. He states that he is now officially divorced from his first wife and remarried to his pregnant girlfriend. He states that she has been tested and she is HIV negative. She is receiving regular obstetrical care. He states that they have decided to not be sexually active until after she delivers in October.    Review of Systems     Objective:   Physical Exam  Constitutional: He appears well-developed and well-nourished. No distress.  HENT:  Mouth/Throat: Oropharynx is clear and moist. No oropharyngeal exudate.  Cardiovascular: Normal rate, regular rhythm and normal heart sounds.   Pulmonary/Chest: Breath sounds normal. He has no wheezes. He has no rales.  Psychiatric: He has a normal mood and affect.          Assessment & Plan:

## 2011-03-08 ENCOUNTER — Encounter: Payer: Self-pay | Admitting: Internal Medicine

## 2011-03-08 NOTE — Progress Notes (Signed)
BC closed pt's file for bridge counseling today. Pt has contact information for [facility] should he require additional case management services.

## 2011-04-06 ENCOUNTER — Ambulatory Visit: Payer: Self-pay

## 2011-04-12 ENCOUNTER — Other Ambulatory Visit: Payer: Self-pay | Admitting: *Deleted

## 2011-04-12 DIAGNOSIS — B20 Human immunodeficiency virus [HIV] disease: Secondary | ICD-10-CM

## 2011-04-12 MED ORDER — ZIDOVUDINE 300 MG PO TABS: 300.0000 mg | ORAL_TABLET | Freq: Two times a day (BID) | ORAL | Status: DC

## 2011-04-12 MED ORDER — EFAVIRENZ-EMTRICITAB-TENOFOVIR 600-200-300 MG PO TABS: 1.0000 | ORAL_TABLET | Freq: Every day | ORAL | Status: DC

## 2011-04-12 MED ORDER — SULFAMETHOXAZOLE-TMP DS 800-160 MG PO TABS: 1.0000 | ORAL_TABLET | Freq: Every day | ORAL | Status: DC

## 2011-05-12 ENCOUNTER — Ambulatory Visit: Payer: Self-pay

## 2011-05-19 ENCOUNTER — Ambulatory Visit: Payer: Self-pay

## 2011-05-21 ENCOUNTER — Telehealth: Payer: Self-pay | Admitting: Internal Medicine

## 2011-05-24 ENCOUNTER — Telehealth: Payer: Self-pay | Admitting: Internal Medicine

## 2011-05-24 NOTE — Telephone Encounter (Signed)
Have tried to renew Darryl Berg's adap since 05/19/11 when he came in - cannot send his application as he has not sent a current paystub. He faxed over one today that was from May 2012 - same one he gave me before from the first ADAP go-around. 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 I explained I needed last paystub he received in August. This same runaround happened last time and he has been repeatedly told he needs to be straightforward and forthright with information, or his application will not be sent. Explained to him time was running out and there would be a gap in getting his medications. Deadline is September 28th and applications have to be mailed to ADAP to be received by that date. This should not have to go on every time he renews his ADAP.  Kandice Robinsons

## 2011-05-25 ENCOUNTER — Other Ambulatory Visit: Payer: Self-pay | Admitting: *Deleted

## 2011-05-25 DIAGNOSIS — B20 Human immunodeficiency virus [HIV] disease: Secondary | ICD-10-CM

## 2011-05-25 MED ORDER — SULFAMETHOXAZOLE-TMP DS 800-160 MG PO TABS: 1.0000 | ORAL_TABLET | Freq: Every day | ORAL | Status: DC

## 2011-05-25 MED ORDER — ZIDOVUDINE 300 MG PO TABS: 300.0000 mg | ORAL_TABLET | Freq: Two times a day (BID) | ORAL | Status: DC

## 2011-05-25 MED ORDER — EFAVIRENZ-EMTRICITAB-TENOFOVIR 600-200-300 MG PO TABS: 1.0000 | ORAL_TABLET | Freq: Every day | ORAL | Status: DC

## 2011-05-28 LAB — T-HELPER CELL (CD4) - (RCID CLINIC ONLY)
CD4 % Helper T Cell: 9 — ABNORMAL LOW
CD4 T Cell Abs: 210 — ABNORMAL LOW

## 2011-06-11 LAB — T-HELPER CELL (CD4) - (RCID CLINIC ONLY)
CD4 % Helper T Cell: 10 — ABNORMAL LOW
CD4 T Cell Abs: 250 — ABNORMAL LOW

## 2011-06-14 NOTE — Telephone Encounter (Signed)
PATIENT'S ADAP IS APPROVED - 06/14/11

## 2011-06-14 NOTE — Telephone Encounter (Signed)
PATIENT DID ADAP AND IS APPROVED - 06/14/11

## 2011-07-19 ENCOUNTER — Ambulatory Visit (INDEPENDENT_AMBULATORY_CARE_PROVIDER_SITE_OTHER): Payer: Self-pay | Admitting: Licensed Clinical Social Worker

## 2011-07-19 ENCOUNTER — Other Ambulatory Visit: Payer: Self-pay | Admitting: Internal Medicine

## 2011-07-19 ENCOUNTER — Other Ambulatory Visit (INDEPENDENT_AMBULATORY_CARE_PROVIDER_SITE_OTHER): Payer: Self-pay

## 2011-07-19 DIAGNOSIS — B20 Human immunodeficiency virus [HIV] disease: Secondary | ICD-10-CM

## 2011-07-19 DIAGNOSIS — Z23 Encounter for immunization: Secondary | ICD-10-CM

## 2011-07-20 LAB — T-HELPER CELL (CD4) - (RCID CLINIC ONLY): CD4 T Cell Abs: 200 uL — ABNORMAL LOW (ref 400–2700)

## 2011-07-27 ENCOUNTER — Telehealth: Payer: Self-pay | Admitting: *Deleted

## 2011-07-27 NOTE — Telephone Encounter (Signed)
Pharmacist called to get contact information for the patient.

## 2011-08-05 ENCOUNTER — Ambulatory Visit: Payer: Self-pay | Admitting: Internal Medicine

## 2011-08-19 ENCOUNTER — Telehealth: Payer: Self-pay | Admitting: *Deleted

## 2011-08-19 ENCOUNTER — Ambulatory Visit: Payer: Self-pay | Admitting: Internal Medicine

## 2011-08-19 NOTE — Telephone Encounter (Signed)
Left message for patient stating he had missed his appointment with Dr. Orvan Falconer and for him to call us back to reschedule.

## 2011-11-17 ENCOUNTER — Emergency Department (HOSPITAL_COMMUNITY)
Admission: EM | Admit: 2011-11-17 | Discharge: 2011-11-17 | Disposition: A | Discharge status: Home / Self Care | Payer: Self-pay | Attending: Emergency Medicine | Admitting: Emergency Medicine

## 2011-11-17 ENCOUNTER — Encounter (HOSPITAL_COMMUNITY): Payer: Self-pay | Admitting: Anesthesiology

## 2011-11-17 ENCOUNTER — Encounter (HOSPITAL_COMMUNITY): Payer: Self-pay

## 2011-11-17 ENCOUNTER — Emergency Department (HOSPITAL_COMMUNITY): Payer: Self-pay | Admitting: Anesthesiology

## 2011-11-17 ENCOUNTER — Encounter (HOSPITAL_COMMUNITY)
Admission: EM | Disposition: A | Discharge status: Home / Self Care | Payer: Self-pay | Source: Home / Self Care | Attending: Emergency Medicine

## 2011-11-17 DIAGNOSIS — R040 Epistaxis: Secondary | ICD-10-CM | POA: Insufficient documentation

## 2011-11-17 DIAGNOSIS — Z79899 Other long term (current) drug therapy: Secondary | ICD-10-CM | POA: Insufficient documentation

## 2011-11-17 DIAGNOSIS — Z21 Asymptomatic human immunodeficiency virus [HIV] infection status: Secondary | ICD-10-CM | POA: Insufficient documentation

## 2011-11-17 HISTORY — DX: Human immunodeficiency virus (HIV) disease: B20

## 2011-11-17 HISTORY — PX: NASAL HEMORRHAGE CONTROL: SHX287

## 2011-11-17 LAB — COMPREHENSIVE METABOLIC PANEL
ALT: 58 U/L — ABNORMAL HIGH (ref 0–53)
Albumin: 3.8 g/dL (ref 3.5–5.2)
Alkaline Phosphatase: 70 U/L (ref 39–117)
Calcium: 9.3 mg/dL (ref 8.4–10.5)
Potassium: 4.2 mEq/L (ref 3.5–5.1)
Sodium: 136 mEq/L (ref 135–145)
Total Protein: 8.7 g/dL — ABNORMAL HIGH (ref 6.0–8.3)

## 2011-11-17 LAB — DIFFERENTIAL
Basophils Relative: 0 % (ref 0–1)
Eosinophils Absolute: 0 10*3/uL (ref 0.0–0.7)
Eosinophils Relative: 1 % (ref 0–5)
Neutrophils Relative %: 44 % (ref 43–77)

## 2011-11-17 LAB — CBC
MCH: 35.7 pg — ABNORMAL HIGH (ref 26.0–34.0)
MCHC: 35.2 g/dL (ref 30.0–36.0)
Platelets: 256 10*3/uL (ref 150–400)
RDW: 14.2 % (ref 11.5–15.5)

## 2011-11-17 LAB — APTT: aPTT: 26 seconds (ref 24–37)

## 2011-11-17 SURGERY — CONTROL OF EPISTAXIS
Anesthesia: General | Site: Nose | Laterality: Bilateral | Wound class: Clean Contaminated

## 2011-11-17 MED ORDER — HEMOSTATIC AGENTS (NO CHARGE) OPTIME
TOPICAL | Status: DC | PRN
  Administered 2011-11-17: 1 via TOPICAL

## 2011-11-17 MED ORDER — SUCCINYLCHOLINE CHLORIDE 20 MG/ML IJ SOLN
INTRAMUSCULAR | Status: DC | PRN
  Administered 2011-11-17: 100 mg via INTRAVENOUS

## 2011-11-17 MED ORDER — PROPOFOL 10 MG/ML IV EMUL
INTRAVENOUS | Status: DC | PRN
  Administered 2011-11-17: 120 mg via INTRAVENOUS

## 2011-11-17 MED ORDER — OXYMETAZOLINE HCL 0.05 % NA SOLN
NASAL | Status: DC | PRN
  Administered 2011-11-17: 1 via NASAL

## 2011-11-17 MED ORDER — ONDANSETRON HCL 4 MG/2ML IJ SOLN
INTRAMUSCULAR | Status: DC | PRN
  Administered 2011-11-17: 4 mg via INTRAVENOUS

## 2011-11-17 MED ORDER — LIDOCAINE-EPINEPHRINE 1 %-1:100000 IJ SOLN
INTRAMUSCULAR | Status: DC | PRN
  Administered 2011-11-17: 30 mL

## 2011-11-17 MED ORDER — HYDROCODONE-ACETAMINOPHEN 5-500 MG PO TABS: 1.0000 | ORAL_TABLET | ORAL | Status: DC | PRN

## 2011-11-17 MED ORDER — HYDROMORPHONE HCL PF 1 MG/ML IJ SOLN: 0.2500 mg | INTRAMUSCULAR | Status: DC | PRN

## 2011-11-17 MED ORDER — 0.9 % SODIUM CHLORIDE (POUR BTL) OPTIME
TOPICAL | Status: DC | PRN
  Administered 2011-11-17: 1000 mL

## 2011-11-17 MED ORDER — LACTATED RINGERS IV SOLN
INTRAVENOUS | Status: DC | PRN
  Administered 2011-11-17 (×2): via INTRAVENOUS

## 2011-11-17 MED ORDER — SUFENTANIL CITRATE 50 MCG/ML IV SOLN
INTRAVENOUS | Status: DC | PRN
  Administered 2011-11-17 (×2): 10 ug via INTRAVENOUS

## 2011-11-17 MED ORDER — BACITRACIN ZINC 500 UNIT/GM EX OINT
TOPICAL_OINTMENT | CUTANEOUS | Status: DC | PRN
  Administered 2011-11-17: 1 via TOPICAL

## 2011-11-17 MED ORDER — LIDOCAINE HCL (CARDIAC) 20 MG/ML IV SOLN
INTRAVENOUS | Status: DC | PRN
  Administered 2011-11-17: 50 mg via INTRAVENOUS

## 2011-11-17 MED ORDER — OXYMETAZOLINE HCL 0.05 % NA SOLN
NASAL | Status: AC
  Administered 2011-11-17: 2 via NASAL
  Filled 2011-11-17: qty 15

## 2011-11-17 MED ORDER — OXYMETAZOLINE HCL 0.05 % NA SOLN
NASAL | Status: AC
  Filled 2011-11-17: qty 15

## 2011-11-17 MED ORDER — DEXAMETHASONE SODIUM PHOSPHATE 4 MG/ML IJ SOLN
INTRAMUSCULAR | Status: DC | PRN
  Administered 2011-11-17: 4 mg via INTRAVENOUS

## 2011-11-17 MED ORDER — CEPHALEXIN 500 MG PO CAPS: 500.0000 mg | ORAL_CAPSULE | Freq: Three times a day (TID) | ORAL | Status: AC

## 2011-11-17 SURGICAL SUPPLY — 32 items
CANISTER SUCTION 2500CC (MISCELLANEOUS) ×2 IMPLANT
CLOTH BEACON ORANGE TIMEOUT ST (SAFETY) ×2 IMPLANT
COAGULATOR SUCT SWTCH 10FR 6 (ELECTROSURGICAL) ×2 IMPLANT
COVER MAYO STAND STRL (DRAPES) ×2 IMPLANT
COVER TABLE BACK 60X90 (DRAPES) ×2 IMPLANT
DRAPE PROXIMA HALF (DRAPES) ×2 IMPLANT
DRESSING NASAL POPE 10X1.5X2.5 (GAUZE/BANDAGES/DRESSINGS) ×2 IMPLANT
DRSG NASAL POPE 10X1.5X2.5 (GAUZE/BANDAGES/DRESSINGS) ×4
ELECT REM PT RETURN 9FT ADLT (ELECTROSURGICAL) ×2
ELECTRODE REM PT RTRN 9FT ADLT (ELECTROSURGICAL) ×1 IMPLANT
FLOSEAL 10ML (HEMOSTASIS) ×2 IMPLANT
GAUZE SPONGE 2X2 8PLY STRL LF (GAUZE/BANDAGES/DRESSINGS) ×1 IMPLANT
GAUZE SPONGE 4X4 16PLY XRAY LF (GAUZE/BANDAGES/DRESSINGS) ×2 IMPLANT
GAUZE VASELINE FOILPK 1/2 X 72 (GAUZE/BANDAGES/DRESSINGS) IMPLANT
GLOVE BIO SURGEON STRL SZ 6.5 (GLOVE) ×2 IMPLANT
GLOVE BIOGEL PI IND STRL 6.5 (GLOVE) ×1 IMPLANT
GLOVE BIOGEL PI INDICATOR 6.5 (GLOVE) ×1
GLOVE ECLIPSE 7.5 STRL STRAW (GLOVE) ×2 IMPLANT
GOWN STRL NON-REIN LRG LVL3 (GOWN DISPOSABLE) ×4 IMPLANT
HEMOSTAT SURGICEL 2X14 (HEMOSTASIS) IMPLANT
KIT BASIN OR (CUSTOM PROCEDURE TRAY) ×2 IMPLANT
KIT ROOM TURNOVER OR (KITS) ×2 IMPLANT
MATRIX HEMOSTAT SURGIFLO (HEMOSTASIS) ×2 IMPLANT
NEEDLE HYPO 25GX1X1/2 BEV (NEEDLE) ×4 IMPLANT
NS IRRIG 1000ML POUR BTL (IV SOLUTION) ×2 IMPLANT
PAD ARMBOARD 7.5X6 YLW CONV (MISCELLANEOUS) ×2 IMPLANT
SPONGE GAUZE 2X2 STER 10/PKG (GAUZE/BANDAGES/DRESSINGS) ×1
SPONGE NEURO XRAY DETECT 1X3 (DISPOSABLE) ×2 IMPLANT
SYR CONTROL 10ML LL (SYRINGE) ×2 IMPLANT
TOWEL OR 17X24 6PK STRL BLUE (TOWEL DISPOSABLE) ×4 IMPLANT
TUBE CONNECTING 12X1/4 (SUCTIONS) ×2 IMPLANT
WATER STERILE IRR 1000ML POUR (IV SOLUTION) ×2 IMPLANT

## 2011-11-17 NOTE — Brief Op Note (Signed)
11/17/2011  7:40 AM  PATIENT:  Darryl Berg  36 y.o. male  PRE-OPERATIVE DIAGNOSIS:  Bilateral nose bleed  POST-OPERATIVE DIAGNOSIS:  Bilateral nose bleed  PROCEDURE:  Procedure(s) (LRB): Endoscopic EPISTAXIS CONTROL (Bilateral)  SURGEON:  Surgeon(s) and Role:    * Darletta Moll, MD - Primary  PHYSICIAN ASSISTANT:   ASSISTANTS: none   ANESTHESIA:   general  EBL:  Total I/O In: -  Out: 350 [Other:350]  BLOOD ADMINISTERED:none  DRAINS: none   LOCAL MEDICATIONS USED:  NONE  SPECIMEN:  No Specimen  DISPOSITION OF SPECIMEN:  N/A  COUNTS:  YES  TOURNIQUET:  * No tourniquets in log *  DICTATION: .Note written in EPIC  PLAN OF CARE: Discharge to home after PACU  PATIENT DISPOSITION:  PACU - hemodynamically stable.   Delay start of Pharmacological VTE agent (>24hrs) due to surgical blood loss or risk of bleeding: not applicable

## 2011-11-17 NOTE — ED Notes (Signed)
Dr.Delo placed another rhino to right nare.

## 2011-11-17 NOTE — Anesthesia Postprocedure Evaluation (Signed)
  Anesthesia Post-op Note  Patient: Darryl Berg  Procedure(s) Performed: Procedure(s) (LRB): EPISTAXIS CONTROL (Bilateral)  Patient Location: PACU  Anesthesia Type: General  Level of Consciousness: awake and alert   Airway and Oxygen Therapy: Patient Spontanous Breathing  Post-op Pain: none  Post-op Assessment: Post-op Vital signs reviewed, Patient's Cardiovascular Status Stable, Respiratory Function Stable, Patent Airway and No signs of Nausea or vomiting  Post-op Vital Signs: Reviewed and stable  Complications: No apparent anesthesia complications

## 2011-11-17 NOTE — Discharge Instructions (Signed)

## 2011-11-17 NOTE — Anesthesia Procedure Notes (Signed)
Procedure Name: Intubation Date/Time: 11/17/2011 7:13 AM Performed by: Glendora Score A Pre-anesthesia Checklist: Patient identified, Emergency Drugs available, Suction available and Patient being monitored Patient Re-evaluated:Patient Re-evaluated prior to inductionOxygen Delivery Method: Circle system utilized Preoxygenation: Pre-oxygenation with 100% oxygen Intubation Type: Rapid sequence and Cricoid Pressure applied Tube type: Oral Tube size: 7.5 mm Number of attempts: 1 Airway Equipment and Method: Stylet and Video-laryngoscopy Placement Confirmation: ETT inserted through vocal cords under direct vision,  positive ETCO2 and breath sounds checked- equal and bilateral Tube secured with: Tape Dental Injury: Teeth and Oropharynx as per pre-operative assessment

## 2011-11-17 NOTE — ED Provider Notes (Signed)
History     CSN: [id number]  Arrival date & time 11/17/11  0139   First MD Initiated Contact with Patient 11/17/11 0154      Chief Complaint  Patient presents with  . Epistaxis    active nosebleed from bilat nares - onset 2000 this PM; states has been taking ASA for last three days for diffuse h/a; estimated total ASA 1300 mg per day x3 days per pt      (Consider location/radiation/quality/duration/timing/severity/associated sxs/prior treatment) HPI Comments: History of HIV disease.  Patient is a 36 y.o. male presenting with nosebleeds. The history is provided by the patient.  Epistaxis  This is a new problem. The current episode started 1 to 2 hours ago. The problem occurs constantly. The problem has not changed since onset.Associated with: blowing nose. The bleeding has been from the right nare. He has tried applying pressure for the symptoms.    Past Medical History  Diagnosis Date  . HIV disease     History reviewed. No pertinent past surgical history.  History reviewed. No pertinent family history.  History  Substance Use Topics  . Smoking status: Never Smoker   . Smokeless tobacco: Never Used  . Alcohol Use: 2.0 oz/week    4 drink(s) per week      Review of Systems  HENT: Positive for nosebleeds.   All other systems reviewed and are negative.    Allergies  Review of patient's allergies indicates no known allergies.  Home Medications   Current Outpatient Rx  Name Route Sig Dispense Refill  . DILTIAZEM HCL 60 MG PO TABS Oral Take 60 mg by mouth 2 (two) times daily.      . EFAVIRENZ-EMTRICITAB-TENOFOVIR 600-200-300 MG PO TABS Oral Take 1 tablet by mouth at bedtime. 30 tablet 11  . EFAVIRENZ-EMTRICITAB-TENOFOVIR 600-200-300 MG PO TABS Oral Take 1 tablet by mouth at bedtime. 30 tablet 11  . HYDROCHLOROTHIAZIDE 50 MG PO TABS Oral Take 50 mg by mouth daily.      . SULFAMETHOXAZOLE-TMP DS 800-160 MG PO TABS Oral Take 1 tablet by mouth daily. 30 tablet 11  .  SULFAMETHOXAZOLE-TRIMETHOPRIM 800-160 MG PO TABS Oral Take 1 tablet by mouth daily. 30 tablet 11  . ZIDOVUDINE 300 MG PO TABS Oral Take 1 tablet (300 mg total) by mouth 2 (two) times daily. 60 tablet 11  . ZIDOVUDINE 300 MG PO TABS Oral Take 1 tablet (300 mg total) by mouth 2 (two) times daily. 60 tablet 11    BP 153/111  Temp(Src) 98 F (36.7 C) (Oral)  Resp 20  SpO2 100%  Physical Exam  Nursing note and vitals reviewed. Constitutional: He is oriented to person, place, and time. He appears well-developed and well-nourished.  HENT:  Head: Normocephalic and atraumatic.       The nose has bleeding from the right nare.    Neck: Normal range of motion.  Abdominal: Soft. Bowel sounds are normal.  Neurological: He is alert and oriented to person, place, and time.  Skin: Skin is warm and dry.    ED Course  Procedures (including critical care time)   Labs Reviewed  CBC  DIFFERENTIAL  COMPREHENSIVE METABOLIC PANEL  PROTIME-INR  APTT   No results found.   No diagnosis found.    MDM  I made multiple attempts at packing with rhinorockets and inflatable gauze packings.  All were unsuccessful.  His labs are unremarkable.  I spoke with Dr. Suszanne Conners from ENT who agrees to see the patient in the ED.  Geoffery Lyons, MD 11/17/11 [phone]

## 2011-11-17 NOTE — ED Notes (Signed)
Blood for labs drawn per phleb.

## 2011-11-17 NOTE — Progress Notes (Signed)
Dr. Suszanne Conners at bedside, talked to pt. Advised to put drip pad, still will have some bleeding.

## 2011-11-17 NOTE — ED Notes (Signed)
Instructed patient to not aggravate the nose with his finger. Patient voiced understanding

## 2011-11-17 NOTE — ED Notes (Signed)
Pt transported to surgery, transported by [employer]

## 2011-11-17 NOTE — ED Notes (Signed)
MD at bedside. Dr.Teoh ENT with patient

## 2011-11-17 NOTE — Progress Notes (Signed)
Report given to maria rn as caregiver 

## 2011-11-17 NOTE — Op Note (Signed)
DATE OF PROCEDURE:  11/17/2011                              OPERATIVE REPORT  SURGEON:  Newman Pies, MD  PREOPERATIVE DIAGNOSES: 1. Bilateral epistaxis  POSTOPERATIVE DIAGNOSES: 1. Bilateral epistaxis  PROCEDURE PERFORMED:  Bilateral endoscopic control of nasal hemorrhage.   ANESTHESIA:  General endotracheal tube anesthesia.  COMPLICATIONS:  None.  ESTIMATED BLOOD LOSS:  50 ml  INDICATION FOR PROCEDURE:  Darryl Berg is a 36 y.o. male who presented to the [name] emergency room last night, complaining of bilateral epistaxis. Multiple attempts in the emergency room to control the nosebleed were unsuccessful. He was packed with both Rhino Rocket and Merocel nasal packings. However, he continues to have bleeding bilaterally. It should be noted that the patient has been taking high-dose aspirin for the last 3 days due to his headache. As a result, the decision was made for patient to undergo control of bilateral nasal hemorrhage in the operating room. The risks, benefits, alternatives, and details of the procedures were discussed with the patient. Questions were invited and answered. Informed consent was obtained.  DESCRIPTION:  The patient was taken to the operating room and placed supine on the operating table.  General endotracheal tube anesthesia was administered by the anesthesiologist.  The patient was positioned and prepped and draped in a standard fashion for nasal surgery. Merocel packings were removed. Attention was first focused on the right side. A 0 endoscope was used to evaluate the right nasal cavity. A large amount of blood clots were noted within the right nasal cavity and the nasopharynx. The blood clots were carefully removed. Inspection of the nasal cavity revealed multiple diffuse oozing sites at the superior nasal septum as well as the middle turbinate. The bleeding sources were cauterized with a suction electrocautery device. Flowseal was applied to the nasal cavity. A  Merocel packing was placed. The same procedure was repeated on the left side. Similar findings will also noted on the left side. Multiple bleeding sources were cauterized at the superior nasal septum and the middle and inferior turbinates. Another  Merocel packing was placed.  OPERATIVE FINDINGS:  Bilateral diffuse epistaxis.  SPECIMEN:  None.  FOLLOWUP CARE:  The patient will be discharged home once awake and alert.  The patient will be placed on Keflex 500mg  po TID for 5 days.  Vicodin will be given for postop pain control.  The patient will follow up in my office in 5 days for packing removal.  [name],SUI W 11/17/2011 7:43 AM

## 2011-11-17 NOTE — Anesthesia Preprocedure Evaluation (Signed)
Anesthesia Evaluation  Patient identified by MRN, date of birth, ID band Patient awake    Reviewed: Allergy & Precautions, H&P , NPO status , Patient's Chart, lab work & pertinent test results  Airway Mallampati: III TM Distance: >3 FB Neck ROM: Full    Dental No notable dental hx. (+) Chipped and Teeth Intact   Pulmonary neg pulmonary ROS,  breath sounds clear to auscultation  Pulmonary exam normal       Cardiovascular hypertension, On Medications Rhythm:Regular Rate:Normal     Neuro/Psych PSYCHIATRIC DISORDERS negative neurological ROS     GI/Hepatic negative GI ROS, Neg liver ROS,   Endo/Other  negative endocrine ROS  Renal/GU negative Renal ROS  negative genitourinary   Musculoskeletal   Abdominal   Peds  Hematology  (+) HIV,   Anesthesia Other Findings   Reproductive/Obstetrics negative OB ROS                           Anesthesia Physical Anesthesia Plan  ASA: III and Emergent  Anesthesia Plan: General   Post-op Pain Management:    Induction: Intravenous, Rapid sequence and Cricoid pressure planned  Airway Management Planned: Oral ETT  Additional Equipment:   Intra-op Plan:   Post-operative Plan: Extubation in OR  Informed Consent: I have reviewed the patients History and Physical, chart, labs and discussed the procedure including the risks, benefits and alternatives for the proposed anesthesia with the patient or authorized representative who has indicated his/her understanding and acceptance.   Dental advisory given  Plan Discussed with: CRNA  Anesthesia Plan Comments:         Anesthesia Quick Evaluation

## 2011-11-17 NOTE — ED Notes (Addendum)
Unable to control the bleeding in ER. Decision to take patient to OR was done by doctor Teoh. Consent for procedure signed

## 2011-11-17 NOTE — ED Notes (Signed)
Active nosebleed from bilat nares; onset 2000 this PM; states has been taking ASA daily for last three days as he has had a diffuse h/a; reports taking approx 1300 mg ASA daily x3 days for same

## 2011-11-17 NOTE — ED Notes (Signed)
Pt actively bleeding again through Rhino Rocket to rgt nare; active bleeding also noted from left nare as well; Rhino Rocket partially removed on own; remaining amount pulled at this time; MD in to reassess

## 2011-11-17 NOTE — Transfer of Care (Signed)
Immediate Anesthesia Transfer of Care Note  Patient: Darryl Berg  Procedure(s) Performed: Procedure(s) (LRB): EPISTAXIS CONTROL (Bilateral)  Patient Location: PACU  Anesthesia Type: General  Level of Consciousness: awake, alert  and patient cooperative  Airway & Oxygen Therapy: Patient Spontanous Breathing and Patient connected to face mask oxygen  Post-op Assessment: Report given to PACU RN  Post vital signs: Reviewed and stable  Complications: No apparent anesthesia complications

## 2011-11-17 NOTE — Consult Note (Signed)
Reason for Consult: Bilateral epistaxis Referring Physician: Geoffery Lyons, MD  HPI:  Darryl Berg is an 36 y.o. male who presents to the [facility] emergency room last night complaining of bilateral epistaxis. The patient has no previous history of epistaxis. According to the patient, he had bouts of sneezing spell last night, resulting in significant right-sided nosebleeds. While he was in the emergency room, his right nasal cavity was packed with Rhino Rocket. He began experiencing significant left-sided bleeding. Attempts to pack both nasal cavities will unsuccessful. He continued to have bilateral epistaxis. The Rhino Rocket packing were replaced with Merocel bilaterally. However he continued to bleed through both Merocel packings. Pt states has been taking ASA for last three days for diffuse h/a; estimated total ASA 1300 mg per day x3 days per pt. He denies any previous history of nasal or facial trauma. No previous history of ENT surgery. No known coagulation disorder.   Past Medical History  Diagnosis Date  . HIV disease     History reviewed. No pertinent past surgical history.  History reviewed. No pertinent family history.  Social History:  reports that he has never smoked. He has never used smokeless tobacco. He reports that he drinks about 2 ounces of alcohol per week. He reports that he does not use illicit drugs.  Allergies: No Known Allergies  Medications:  I have reviewed the patient's current medications. Prior to Admission:  Prescriptions prior to admission  Medication Sig Dispense Refill  . aspirin 325 MG tablet Take 325 mg by mouth every 6 (six) hours as needed. For pain      . efavirenz-emtrictabine-tenofovir (ATRIPLA) 600-200-300 MG per tablet Take 1 tablet by mouth at bedtime.  30 tablet  11  . hydrochlorothiazide 50 MG tablet Take 50 mg by mouth daily.        Marland Kitchen sulfamethoxazole-trimethoprim (BACTRIM DS) 800-160 MG per tablet Take 1 tablet by mouth daily.  30  tablet  11    Results for orders placed during the hospital encounter of 11/17/11 (from the past 48 hour(s))  CBC     Status: Abnormal   Collection Time   11/17/11  2:04 AM      Component Value Range Comment   WBC 3.2 (*) 4.0 - 10.5 (K/uL)    RBC 3.84 (*) 4.22 - 5.81 (MIL/uL)    Hemoglobin 13.7  13.0 - 17.0 (g/dL)    HCT 09.8 (*) 11.9 - 52.0 (%)    MCV 101.3 (*) 78.0 - 100.0 (fL)    MCH 35.7 (*) 26.0 - 34.0 (pg)    MCHC 35.2  30.0 - 36.0 (g/dL)    RDW 14.7  82.9 - 56.2 (%)    Platelets 256  150 - 400 (K/uL)   DIFFERENTIAL     Status: Abnormal   Collection Time   11/17/11  2:04 AM      Component Value Range Comment   Neutrophils Relative 44  43 - 77 (%)    Neutro Abs 1.4 (*) 1.7 - 7.7 (K/uL)    Lymphocytes Relative 43  12 - 46 (%)    Lymphs Abs 1.4  0.7 - 4.0 (K/uL)    Monocytes Relative 12  3 - 12 (%)    Monocytes Absolute 0.4  0.1 - 1.0 (K/uL)    Eosinophils Relative 1  0 - 5 (%)    Eosinophils Absolute 0.0  0.0 - 0.7 (K/uL)    Basophils Relative 0  0 - 1 (%)    Basophils Absolute  0.0  0.0 - 0.1 (K/uL)   COMPREHENSIVE METABOLIC PANEL     Status: Abnormal   Collection Time   11/17/11  2:04 AM      Component Value Range Comment   Sodium 136  135 - 145 (mEq/L)    Potassium 4.2  3.5 - 5.1 (mEq/L) HEMOLYSIS AT THIS LEVEL MAY AFFECT RESULT   Chloride 95 (*) 96 - 112 (mEq/L)    CO2 29  19 - 32 (mEq/L)    Glucose, Bld 121 (*) 70 - 99 (mg/dL)    BUN 9  6 - 23 (mg/dL)    Creatinine, Ser 1.61  0.50 - 1.35 (mg/dL)    Calcium 9.3  8.4 - 10.5 (mg/dL)    Total Protein 8.7 (*) 6.0 - 8.3 (g/dL)    Albumin 3.8  3.5 - 5.2 (g/dL)    AST 096 (*) 0 - 37 (U/L) HEMOLYSIS AT THIS LEVEL MAY AFFECT RESULT   ALT 58 (*) 0 - 53 (U/L)    Alkaline Phosphatase 70  39 - 117 (U/L)    Total Bilirubin 0.7  0.3 - 1.2 (mg/dL)    GFR calc non Af Amer >90  >90 (mL/min)    GFR calc Af Amer >90  >90 (mL/min)   PROTIME-INR     Status: Normal   Collection Time   11/17/11  2:04 AM      Component Value Range  Comment   Prothrombin Time 12.5  11.6 - 15.2 (seconds)    INR 0.91  0.00 - 1.49    APTT     Status: Normal   Collection Time   11/17/11  2:04 AM      Component Value Range Comment   aPTT 26  24 - 37 (seconds)     No results found.  Review of Systems  HENT: Positive for nosebleeds.  All other systems reviewed and are negative.  Blood pressure 146/76, pulse 122, temperature 98 F (36.7 C), temperature source Oral, resp. rate 20, SpO2 100.00%.  Physical Exam  Nursing note and vitals reviewed.  Constitutional: He is oriented to person, place, and time. He appears well-developed and well-nourished.  HENT:  Head: Normocephalic and atraumatic.  The nose has bleeding from bilateral nasal cavity. OC: wnl AU: Auricle and EAC wnl. Neck: Normal range of motion. No LAD/mass Abdominal: Soft. Bowel sounds are normal.  Neurological: He is alert and oriented to person, place, and time.  Skin: Skin is warm and dry.    Assessment/Plan: Persistent bilateral epistaxis, not responding to bilateral Merocel packing. In light of the above findings, the decision was made for patient to undergo control of nasal hemorrhage in the operating room. The plan is to perform endoscopic nasal cautery of the bleeding sources. The risks, benefits, and details of the procedure were discussed with the patient. Informed consent was obtained.  Darryl Berg,SUI W 11/17/2011, 6:47 AM

## 2011-11-17 NOTE — ED Notes (Signed)
Patient pulled rhino out. He stated he sneezed and came out. Pressure applied to his nose. Dr.Delo notified and checked on the patient.

## 2011-11-19 ENCOUNTER — Encounter (HOSPITAL_COMMUNITY): Payer: Self-pay | Admitting: Otolaryngology

## 2011-11-30 ENCOUNTER — Telehealth: Payer: Self-pay

## 2011-11-30 ENCOUNTER — Ambulatory Visit: Payer: Self-pay

## 2011-11-30 NOTE — Telephone Encounter (Signed)
Patient did come in to renew adap/rw, but brought none of his financial info with him, stated his employer was going to fax to [place] - asked what number he had given them and he said, "I don't know". Gave him a copy of my bus card that has fax # on it and told him to have them fax to that number. Advised could not process his application without W2 and paystubs. Had him sign the documents, and told him to get these as soon as he could in order to complete application.

## 2011-12-02 ENCOUNTER — Telehealth: Payer: Self-pay

## 2011-12-02 NOTE — Telephone Encounter (Signed)
Called patient and left messages on home and cell phone to call - have not received fax yet from employer for W2 and paystubs - wanted to let him know needed in order to process application.

## 2011-12-03 ENCOUNTER — Telehealth: Payer: Self-pay

## 2011-12-03 NOTE — Telephone Encounter (Signed)
Called patient again about financial documentation from employer to complete ADAP application  - still not received a fax from them. His home phone is not taking calls at this time and the mobile phone belongs to a friend who has tried to contact him as well. Will try again next week if it does not come in. Called patient and left messages on home and cell phone to call - have not received fax yet from employer for W2 and paystubs - wanted to let him know needed in order to process application.

## 2011-12-06 ENCOUNTER — Telehealth: Payer: Self-pay

## 2011-12-06 NOTE — Telephone Encounter (Signed)
See previous phone notes. Patient came in to renew ADAP on 4/2 - I had initiated the contact as had not heard from him.  He said he would have his employer fax over his W2 and 2 current paystubs, so far they have not done so. This seems to happen every ADAP reauthorization period and not sure why that is. He was approved through 11/28/11, but it took a lot to get him to send anything then. This is just an [place]. I won't keep calling and leaving messages as he should know what to do at this point.

## 2011-12-09 ENCOUNTER — Telehealth: Payer: Self-pay

## 2011-12-09 NOTE — Telephone Encounter (Signed)
Called Maryellen Pile at [employer] who would be patient's processor - she remembered him from before - told her the issues with financial documentation - explained he worked for [employer] and income was pretty much the same amt each time he works, which is pretty sporadic - said to send the 2 paystubs from Feb 2013, advised she will review it - will mail today - hopefully will work for them.

## 2011-12-09 NOTE — Telephone Encounter (Signed)
FYI, Called patient again today and left message about needing W2 for ADAP or the last paystub from 2012 showing YTD income.Even if he worked every week, he is not going to be over income and stressed that to him, in case he is concerned about it.  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 If [employer]AP pends it, he will have to send in documentation to stay on program.

## 2012-06-20 ENCOUNTER — Ambulatory Visit: Payer: Self-pay

## 2012-06-27 ENCOUNTER — Ambulatory Visit: Payer: Self-pay

## 2012-06-27 ENCOUNTER — Ambulatory Visit (INDEPENDENT_AMBULATORY_CARE_PROVIDER_SITE_OTHER): Payer: Self-pay

## 2012-06-27 DIAGNOSIS — Z23 Encounter for immunization: Secondary | ICD-10-CM

## 2012-07-03 ENCOUNTER — Other Ambulatory Visit: Payer: Self-pay | Admitting: *Deleted

## 2012-07-03 DIAGNOSIS — B20 Human immunodeficiency virus [HIV] disease: Secondary | ICD-10-CM

## 2012-07-04 ENCOUNTER — Other Ambulatory Visit (INDEPENDENT_AMBULATORY_CARE_PROVIDER_SITE_OTHER): Payer: Self-pay

## 2012-07-04 ENCOUNTER — Other Ambulatory Visit: Payer: Self-pay | Admitting: Internal Medicine

## 2012-07-04 DIAGNOSIS — Z113 Encounter for screening for infections with a predominantly sexual mode of transmission: Secondary | ICD-10-CM

## 2012-07-04 DIAGNOSIS — B20 Human immunodeficiency virus [HIV] disease: Secondary | ICD-10-CM

## 2012-07-04 LAB — CBC WITH DIFFERENTIAL/PLATELET
Basophils Absolute: 0.1 10*3/uL (ref 0.0–0.1)
Basophils Relative: 2 % — ABNORMAL HIGH (ref 0–1)
MCHC: 33.8 g/dL (ref 30.0–36.0)
Neutro Abs: 1.2 10*3/uL — ABNORMAL LOW (ref 1.7–7.7)
Neutrophils Relative %: 26 % — ABNORMAL LOW (ref 43–77)
Platelets: 322 10*3/uL (ref 150–400)
RDW: 14.3 % (ref 11.5–15.5)

## 2012-07-04 LAB — COMPLETE METABOLIC PANEL WITH GFR
AST: 76 U/L — ABNORMAL HIGH (ref 0–37)
Albumin: 4.4 g/dL (ref 3.5–5.2)
Alkaline Phosphatase: 61 U/L (ref 39–117)
Potassium: 4.4 mEq/L (ref 3.5–5.3)
Sodium: 140 mEq/L (ref 135–145)
Total Protein: 8.9 g/dL — ABNORMAL HIGH (ref 6.0–8.3)

## 2012-07-04 LAB — RPR

## 2012-07-04 NOTE — Addendum Note (Signed)
Addended by: Emilio Aspen on: 07/04/2012 10:20 AM   Modules accepted: Orders

## 2012-07-05 LAB — HIV-1 RNA QUANT-NO REFLEX-BLD
HIV 1 RNA Quant: 46143 copies/mL — ABNORMAL HIGH (ref <20)
HIV-1 RNA Quant, Log: 4.66 {Log} — ABNORMAL HIGH (ref <1.30)

## 2012-07-05 LAB — T-HELPER CELL (CD4) - (RCID CLINIC ONLY): CD4 % Helper T Cell: 4 % — ABNORMAL LOW (ref 33–55)

## 2012-07-07 ENCOUNTER — Other Ambulatory Visit: Payer: Self-pay | Admitting: *Deleted

## 2012-07-07 DIAGNOSIS — B20 Human immunodeficiency virus [HIV] disease: Secondary | ICD-10-CM

## 2012-07-07 MED ORDER — EFAVIRENZ-EMTRICITAB-TENOFOVIR 600-200-300 MG PO TABS: 1.0000 | ORAL_TABLET | Freq: Every day | ORAL | Status: DC

## 2012-07-07 MED ORDER — SULFAMETHOXAZOLE-TMP DS 800-160 MG PO TABS: 1.0000 | ORAL_TABLET | Freq: Every day | ORAL | Status: DC

## 2012-07-11 ENCOUNTER — Other Ambulatory Visit: Payer: Self-pay | Admitting: *Deleted

## 2012-07-11 DIAGNOSIS — B20 Human immunodeficiency virus [HIV] disease: Secondary | ICD-10-CM

## 2012-07-11 MED ORDER — ZIDOVUDINE 300 MG PO TABS: 300.0000 mg | ORAL_TABLET | Freq: Two times a day (BID) | ORAL | Status: DC

## 2012-07-11 MED ORDER — EFAVIRENZ-EMTRICITAB-TENOFOVIR 600-200-300 MG PO TABS: 1.0000 | ORAL_TABLET | Freq: Every day | ORAL | Status: DC

## 2012-07-18 ENCOUNTER — Encounter: Payer: Self-pay | Admitting: Internal Medicine

## 2012-07-18 ENCOUNTER — Ambulatory Visit (INDEPENDENT_AMBULATORY_CARE_PROVIDER_SITE_OTHER): Payer: Self-pay | Admitting: Internal Medicine

## 2012-07-18 VITALS — BP 149/105 | HR 103 | Temp 98.3°F | Ht 66.0 in | Wt 142.5 lb

## 2012-07-18 DIAGNOSIS — B20 Human immunodeficiency virus [HIV] disease: Secondary | ICD-10-CM

## 2012-07-18 MED ORDER — HYDROCHLOROTHIAZIDE 50 MG PO TABS: 50.0000 mg | ORAL_TABLET | Freq: Every day | ORAL | Status: DC

## 2012-07-18 NOTE — Progress Notes (Signed)
Patient ID: Darryl Berg, male   DOB: Nov 21, 1975, 36 y.o.   MRN: [id number]     [facility] for Infectious Disease  Patient Active Problem List  Diagnosis  . TB, PULMONARY NOS, UNSPECIFIED  . DISEASE, MYCOBACTERIAL NEC  . HIV DISEASE  . ANXIETY  . ABUSE, ALCOHOL, CONTINUOUS  . DISORDER, TOBACCO USE  . DEPRESSION  . HYPERTENSION    Patient's Medications  New Prescriptions   No medications on file  Previous Medications   EFAVIRENZ-EMTRICITABINE-TENOFOVIR (ATRIPLA) 600-200-300 MG PER TABLET    Take 1 tablet by mouth at bedtime.   SULFAMETHOXAZOLE-TRIMETHOPRIM (BACTRIM DS) 800-160 MG PER TABLET    Take 1 tablet by mouth daily.   ZIDOVUDINE (RETROVIR) 300 MG TABLET    Take 1 tablet (300 mg total) by mouth 2 (two) times daily.  Modified Medications   Modified Medication Previous Medication   HYDROCHLOROTHIAZIDE (HYDRODIURIL) 50 MG TABLET hydrochlorothiazide 50 MG tablet      Take 1 tablet (50 mg total) by mouth daily.    Take 50 mg by mouth daily.    Discontinued Medications   HYDROCODONE-ACETAMINOPHEN (VICODIN) 5-500 MG PER TABLET    Take 1 tablet by mouth every 4 (four) hours as needed for pain.    Subjective: Darryl Berg is in for his first visit since June of last year. He lost his job and had been off of his medications but states that he has been taking them faithfully since September of this year. He states that there was a period of about 2 months where he was out of all medicines. He has not had his hydrochlorothiazide in a little over one year. His girlfriend did deliver a healthy baby girl a little over one year ago. He states that his girlfriend and daughter are HIV negative. He states that he has been worried about his HIV since that been so long since he has been in to see me.  Objective: Temp: 98.3 F (36.8 C) (11/19 1025) Temp src: Oral (11/19 1025) BP: 149/105 mmHg (11/19 1025) Pulse Rate: 103  (11/19 1025)  General: He is in good spirits Skin: No rash Lungs:  Clear Cor: Regular S1 and S2 and no murmurs Abdomen: Soft and nontender  Lab Results HIV 1 RNA Quant (copies/mL)  Date Value  07/04/2012 46143*  07/19/2011 63100*  02/11/2011 77*     CD4 T Cell Abs (cmm)  Date Value  07/04/2012 110*  07/19/2011 200*  02/11/2011 230*     Assessment: His HIV infection is not well controlled over the past year. I suspect that he has been off of this medication much more than he is willing to tell me. I am not sure that he actually is taking his medications currently. If he is he has probably developed a resistant virus.  Plan: 1. Continue current medications for now 2. Refill hydrochlorothiazide 3. Check genotype resistance test 4. Followup in 3-4 weeks   Cliffton Asters, MD [facility] for Infectious Disease [facility] [phone] pager   [phone] cell 07/18/2012, 10:56 AM

## 2012-07-19 ENCOUNTER — Other Ambulatory Visit (INDEPENDENT_AMBULATORY_CARE_PROVIDER_SITE_OTHER): Payer: Self-pay

## 2012-07-20 LAB — HIV-1 GENOTYPR PLUS

## 2012-07-31 ENCOUNTER — Other Ambulatory Visit: Payer: Self-pay | Admitting: Internal Medicine

## 2012-07-31 ENCOUNTER — Other Ambulatory Visit (INDEPENDENT_AMBULATORY_CARE_PROVIDER_SITE_OTHER): Payer: Self-pay

## 2012-07-31 DIAGNOSIS — Z79899 Other long term (current) drug therapy: Secondary | ICD-10-CM

## 2012-07-31 DIAGNOSIS — B20 Human immunodeficiency virus [HIV] disease: Secondary | ICD-10-CM

## 2012-08-01 LAB — LIPID PANEL
LDL Cholesterol: 86 mg/dL (ref 0–99)
Total CHOL/HDL Ratio: 2.3 Ratio

## 2012-08-01 LAB — T-HELPER CELL (CD4) - (RCID CLINIC ONLY)
CD4 % Helper T Cell: 4 % — ABNORMAL LOW (ref 33–55)
CD4 T Cell Abs: 90 uL — ABNORMAL LOW (ref 400–2700)

## 2012-08-02 LAB — HIV-1 RNA QUANT-NO REFLEX-BLD
HIV 1 RNA Quant: 12800 copies/mL — ABNORMAL HIGH (ref <20)
HIV-1 RNA Quant, Log: 4.11 {Log} — ABNORMAL HIGH (ref <1.30)

## 2012-08-03 ENCOUNTER — Other Ambulatory Visit: Payer: Self-pay | Admitting: *Deleted

## 2012-08-03 DIAGNOSIS — B20 Human immunodeficiency virus [HIV] disease: Secondary | ICD-10-CM

## 2012-08-03 MED ORDER — ZIDOVUDINE 300 MG PO TABS: 300.0000 mg | ORAL_TABLET | Freq: Two times a day (BID) | ORAL | Status: DC

## 2012-08-03 MED ORDER — SULFAMETHOXAZOLE-TMP DS 800-160 MG PO TABS: 1.0000 | ORAL_TABLET | Freq: Every day | ORAL | Status: DC

## 2012-08-17 ENCOUNTER — Ambulatory Visit (INDEPENDENT_AMBULATORY_CARE_PROVIDER_SITE_OTHER): Payer: Self-pay | Admitting: Internal Medicine

## 2012-08-17 ENCOUNTER — Encounter: Payer: Self-pay | Admitting: Internal Medicine

## 2012-08-17 VITALS — BP 134/99 | HR 137 | Temp 98.1°F | Ht 66.0 in | Wt 136.8 lb

## 2012-08-17 DIAGNOSIS — B20 Human immunodeficiency virus [HIV] disease: Secondary | ICD-10-CM

## 2012-08-17 LAB — COMPREHENSIVE METABOLIC PANEL
ALT: 34 U/L (ref 0–53)
BUN: 9 mg/dL (ref 6–23)
CO2: 28 mEq/L (ref 19–32)
Calcium: 9.2 mg/dL (ref 8.4–10.5)
Creat: 1.11 mg/dL (ref 0.50–1.35)
Glucose, Bld: 135 mg/dL — ABNORMAL HIGH (ref 70–99)
Total Bilirubin: 0.9 mg/dL (ref 0.3–1.2)

## 2012-08-17 NOTE — Progress Notes (Signed)
Patient ID: Darryl Berg, male   DOB: [birth year], 36 y.o.   MRN: [id number]     [facility] for Infectious Disease  Patient Active Problem List  Diagnosis  . TB, PULMONARY NOS, UNSPECIFIED  . DISEASE, MYCOBACTERIAL NEC  . HIV DISEASE  . ANXIETY  . ABUSE, ALCOHOL, CONTINUOUS  . DISORDER, TOBACCO USE  . DEPRESSION  . HYPERTENSION    Patient's Medications  New Prescriptions   No medications on file  Previous Medications   EFAVIRENZ-EMTRICITABINE-TENOFOVIR (ATRIPLA) 600-200-300 MG PER TABLET    Take 1 tablet by mouth at bedtime.   HYDROCHLOROTHIAZIDE (HYDRODIURIL) 50 MG TABLET    Take 1 tablet (50 mg total) by mouth daily.   SULFAMETHOXAZOLE-TRIMETHOPRIM (BACTRIM DS) 800-160 MG PER TABLET    Take 1 tablet by mouth daily.   ZIDOVUDINE (RETROVIR) 300 MG TABLET    Take 1 tablet (300 mg total) by mouth 2 (two) times daily.  Modified Medications   No medications on file  Discontinued Medications   No medications on file    Subjective: JP this in for his routine visit. He denies missing any doses of his Atripla or Retrovir since his last visit. He states that he is getting lots of help from his church recently. He has been drinking more alcohol on the weekends because he is bored. He states that he occasionally has problems with itching it keeps him awake at night. This is most noticeable when he stops drinking alcohol.  Objective: Temp: 98.1 F (36.7 C) (12/19 1006) Temp src: Oral (12/19 1006) BP: 134/99 mmHg (12/19 1006) Pulse Rate: 137  (12/19 1006)  General: His weight is down slightly Skin: No rash Lungs: Clear Cor: Regular S1-S2 no murmurs Abdomen: Soft and nontender  Lab Results HIV 1 RNA Quant (copies/mL)  Date Value  07/31/2012 12800*  07/04/2012 46143*  07/19/2011 63100*     CD4 T Cell Abs (cmm)  Date Value  07/31/2012 90*  07/04/2012 110*  07/19/2011 200*     Assessment: His HIV infection is poorly controlled and I suspect he may have developed resistance  to his current regimen.  Plan: 1. Continue current regimen for now 2. Check genotype resistance test, HLA [id number] and a complete metabolic panel 3. Followup in 3-4 weeks   Cliffton Asters, MD [facility] for Infectious Disease [facility] [phone] pager   [phone] cell 08/17/2012, 10:20 AM

## 2012-08-22 LAB — HIV-1 GENOTYPR PLUS

## 2012-08-25 LAB — HLA B*5701: HLA-B*5701: NEGATIVE

## 2012-09-11 ENCOUNTER — Other Ambulatory Visit: Payer: Self-pay | Admitting: *Deleted

## 2012-09-11 ENCOUNTER — Other Ambulatory Visit: Payer: Self-pay | Admitting: Internal Medicine

## 2012-09-11 DIAGNOSIS — B20 Human immunodeficiency virus [HIV] disease: Secondary | ICD-10-CM

## 2012-09-11 MED ORDER — SULFAMETHOXAZOLE-TMP DS 800-160 MG PO TABS: 1.0000 | ORAL_TABLET | Freq: Every day | ORAL | Status: DC

## 2012-09-11 MED ORDER — ZIDOVUDINE 300 MG PO TABS: 300.0000 mg | ORAL_TABLET | Freq: Two times a day (BID) | ORAL | Status: DC

## 2012-09-11 MED ORDER — EFAVIRENZ-EMTRICITAB-TENOFOVIR 600-200-300 MG PO TABS: 1.0000 | ORAL_TABLET | Freq: Every day | ORAL | Status: DC

## 2012-09-18 ENCOUNTER — Telehealth: Payer: Self-pay | Admitting: *Deleted

## 2012-09-18 ENCOUNTER — Ambulatory Visit: Payer: Self-pay | Admitting: Internal Medicine

## 2012-09-18 NOTE — Telephone Encounter (Signed)
Called and left patient a voice mail to call the clinic to reschedule, he no showed today. Darryl Berg

## 2012-10-10 ENCOUNTER — Ambulatory Visit: Payer: Self-pay | Admitting: Internal Medicine

## 2012-10-10 ENCOUNTER — Telehealth: Payer: Self-pay | Admitting: *Deleted

## 2012-10-10 NOTE — Telephone Encounter (Signed)
Made new appt for 11/01/12.

## 2012-11-01 ENCOUNTER — Encounter: Payer: Self-pay | Admitting: Internal Medicine

## 2012-11-01 ENCOUNTER — Ambulatory Visit (INDEPENDENT_AMBULATORY_CARE_PROVIDER_SITE_OTHER): Payer: Self-pay | Admitting: Internal Medicine

## 2012-11-01 VITALS — BP 131/89 | HR 93 | Temp 98.2°F | Ht 66.0 in | Wt 141.5 lb

## 2012-11-01 DIAGNOSIS — B20 Human immunodeficiency virus [HIV] disease: Secondary | ICD-10-CM

## 2012-11-01 NOTE — Progress Notes (Signed)
Patient ID: Darryl Berg, male   DOB: [birth year], 37 y.o.   MRN: [id number]          [facility] for Infectious Disease  Patient Active Problem List  Diagnosis  . TB, PULMONARY NOS, UNSPECIFIED  . DISEASE, MYCOBACTERIAL NEC  . HIV DISEASE  . ANXIETY  . ABUSE, ALCOHOL, CONTINUOUS  . DISORDER, TOBACCO USE  . DEPRESSION  . HYPERTENSION    Patient's Medications  New Prescriptions   No medications on file  Previous Medications   EFAVIRENZ-EMTRICITABINE-TENOFOVIR (ATRIPLA) 600-200-300 MG PER TABLET    Take 1 tablet by mouth at bedtime.   HYDROCHLOROTHIAZIDE (HYDRODIURIL) 50 MG TABLET    Take 1 tablet (50 mg total) by mouth daily.   SULFAMETHOXAZOLE-TRIMETHOPRIM (BACTRIM DS) 800-160 MG PER TABLET    Take 1 tablet by mouth daily.   ZIDOVUDINE (RETROVIR) 300 MG TABLET    Take 1 tablet (300 mg total) by mouth 2 (two) times daily.  Modified Medications   No medications on file  Discontinued Medications   ATRIPLA 600-200-300 MG PER TABLET    TAKE 1 TABLET BY MOUTH AT BEDTIME   SULFAMETHOXAZOLE-TRIMETHOPRIM (BACTRIM DS) 800-160 MG PER TABLET    TAKE 1 TABLET BY MOUTH EVERY DAY    Subjective: Darryl Berg is in for his routine visit. He states that his done much better taking his medications since his last visit. However, he states that he stopped using his pillbox. He is married and has a 58-month-old child.  Objective: Temp: 98.2 F (36.8 C) (03/05 1512) Temp src: Oral (03/05 1512) BP: 131/89 mmHg (03/05 1512) Pulse Rate: 93 (03/05 1512)  General: alert and in good spirits Skin: no rash Lungs: clear Cor: regular S1 and S2 no murmurs  Lab Results HIV 1 RNA Quant (copies/mL)  Date Value  07/31/2012 12800*  07/04/2012 46143*  07/19/2011 63100*     CD4 T Cell Abs (cmm)  Date Value  07/31/2012 90*  07/04/2012 110*  07/19/2011 200*     Assessment: Darryl Berg this had chronic problems with adherence to his medications and regular visits. He has developed more resistance and is now on a  suboptimal regimen. I've asked him to make a list of popsicles he has had over the years taking various HIV regimens and return next week with all of his bottles. I will have him meet with our pharmacist and help control a new salvage regimen.  Plan: 1. Return next Tuesday, March 11 2 construct a salvage regimen   Cliffton Asters, MD [facility] for Infectious Disease [facility] [phone] pager   [phone] cell 11/01/2012, 3:44 PM

## 2012-11-07 ENCOUNTER — Ambulatory Visit: Payer: Self-pay | Admitting: Internal Medicine

## 2012-11-07 ENCOUNTER — Encounter: Payer: Self-pay | Admitting: Internal Medicine

## 2012-11-07 ENCOUNTER — Ambulatory Visit (INDEPENDENT_AMBULATORY_CARE_PROVIDER_SITE_OTHER): Payer: Self-pay | Admitting: Internal Medicine

## 2012-11-07 VITALS — BP 131/89 | HR 98 | Temp 98.3°F | Ht 66.0 in | Wt 137.0 lb

## 2012-11-07 DIAGNOSIS — B20 Human immunodeficiency virus [HIV] disease: Secondary | ICD-10-CM

## 2012-11-07 MED ORDER — RITONAVIR 100 MG PO TABS: 100.0000 mg | ORAL_TABLET | Freq: Every day | ORAL | Status: DC

## 2012-11-07 MED ORDER — DARUNAVIR ETHANOLATE 800 MG PO TABS: 800.0000 mg | ORAL_TABLET | Freq: Every day | ORAL | Status: DC

## 2012-11-07 MED ORDER — TENOFOVIR DISOPROXIL FUMARATE 300 MG PO TABS: 300.0000 mg | ORAL_TABLET | Freq: Every day | ORAL | Status: DC

## 2012-11-07 MED ORDER — DOLUTEGRAVIR SODIUM 50 MG PO TABS: 50.0000 mg | ORAL_TABLET | Freq: Every day | ORAL | Status: DC

## 2012-11-07 NOTE — Progress Notes (Signed)
Patient ID: Darryl Berg, male   DOB: 1976-03-01, 37 y.o.   MRN: [id number]          [facility] for Infectious Disease  Patient Active Problem List  Diagnosis  . TB, PULMONARY NOS, UNSPECIFIED  . DISEASE, MYCOBACTERIAL NEC  . HIV DISEASE  . ANXIETY  . ABUSE, ALCOHOL, CONTINUOUS  . DISORDER, TOBACCO USE  . DEPRESSION  . HYPERTENSION    Patient's Medications  New Prescriptions   DARUNAVIR ETHANOLATE (PREZISTA) 800 MG TABLET    Take 1 tablet (800 mg total) by mouth daily with breakfast.   DOLUTEGRAVIR SODIUM (TIVICAY) 50 MG TABS    Take 1 tablet (50 mg total) by mouth daily.   RITONAVIR (NORVIR) 100 MG TABS    Take 1 tablet (100 mg total) by mouth daily.   TENOFOVIR (VIREAD) 300 MG TABLET    Take 1 tablet (300 mg total) by mouth daily.  Previous Medications   HYDROCHLOROTHIAZIDE (HYDRODIURIL) 50 MG TABLET    Take 1 tablet (50 mg total) by mouth daily.   SULFAMETHOXAZOLE-TRIMETHOPRIM (BACTRIM DS) 800-160 MG PER TABLET    Take 1 tablet by mouth daily.   ZIDOVUDINE (RETROVIR) 300 MG TABLET    Take 1 tablet (300 mg total) by mouth 2 (two) times daily.  Modified Medications   No medications on file  Discontinued Medications   EFAVIRENZ-EMTRICITABINE-TENOFOVIR (ATRIPLA) 600-200-300 MG PER TABLET    Take 1 tablet by mouth at bedtime.    Subjective: Darryl Berg is in for his followup visit. He now tells me that he has been having trouble tolerating Atripla for a long time.he says that it makes him excessively sleepy and gives him bad dreams. He has not been taking his medications correctly but says our discussion about his ability to care for his young son at the time of his last visit has made him really think his commitment to taking care of himself.  Objective: Temp: 98.3 F (36.8 C) (03/11 1147) Temp src: Oral (03/11 1147) BP: 131/89 mmHg (03/11 1147) Pulse Rate: 98 (03/11 1147)  General: he is healthy in good spirits Skin: no rash Lungs: clear Cor: regular S1 and S2 no  murmurs  Lab Results HIV 1 RNA Quant (copies/mL)  Date Value  07/31/2012 12800*  07/04/2012 46143*  07/19/2011 63100*     CD4 T Cell Abs (cmm)  Date Value  07/31/2012 90*  07/04/2012 110*  07/19/2011 200*     Assessment: Darryl Berg is ready to start a new once daily salvage regimen. He prefers to take medication in the morning, with food before going to work. I will start him on Viread, Prezista, Norvir, and Tivicay.  Plan: 1. Start new salvage regimen 2. Education and new pillbox provided 3. Return in one week with pillbox and medications   Cliffton Asters, MD [facility] for Infectious Disease [facility] [phone] pager   [phone] cell 11/07/2012, 12:31 PM

## 2012-11-07 NOTE — Progress Notes (Signed)
HPI: Darryl Berg is a 37 y.o. male for follow-up of lab results.  Allergies: No Known Allergies  Vitals: Temp: 98.3 F (36.8 C) (03/11 1147) Temp src: Oral (03/11 1147) BP: 131/89 mmHg (03/11 1147) Pulse Rate: 98 (03/11 1147)  Past Medical History: Past Medical History  Diagnosis Date  . HIV disease     Social History: History   Social History  . Marital Status: Single    Spouse Name: N/A    Number of Children: N/A  . Years of Education: N/A   Social History Main Topics  . Smoking status: Never Smoker   . Smokeless tobacco: Never Used  . Alcohol Use: 6.0 oz/week    12 drink(s) per week  . Drug Use: No  . Sexually Active: Yes     Comment: accepted condoms   Other Topics Concern  . None   Social History Narrative  . None    Current Regimen: Atripla + Zidovudine  Labs: HIV 1 RNA Quant (copies/mL)  Date Value  07/31/2012 12800*  07/04/2012 46143*  07/19/2011 63100*     CD4 T Cell Abs (cmm)  Date Value  07/31/2012 90*  07/04/2012 110*  07/19/2011 200*     Hep B S Ab (no units)  Date Value  10/24/2006 Yes      Hepatitis B Surface Ag (no units)  Date Value  10/24/2006 No      HCV Ab (no units)  Date Value  10/24/2006 No     CrCl: Estimated Creatinine Clearance: 80 ml/min (by C-G formula based on Cr of 1.11).  Lipids:    Component Value Date/Time   CHOL 182 07/31/2012 1531   TRIG 82 07/31/2012 1531   HDL 80 07/31/2012 1531   CHOLHDL 2.3 07/31/2012 1531   VLDL 16 07/31/2012 1531   LDLCALC 86 07/31/2012 1531    Assessment: His viral load remains uncontrolled, and his CD4 count is declining.  He reports intolerance to his current regimen including fatigue, extreme sleepiness, and subsequent weight loss.  He misses at least 2 doses per week of his Atripla.  Resistance testing reveals resistance to Efavirenz, 3TC/FTC, abacavir,   He would like a regimen that he can take with food.  He is OK with taking multiple tablets per  day.  Recommendations: Based on my discussion with him I think it is important to start a regimen that can be taken once-daily.  Continue Tenofovir Start Darunavir/ritonavir and Dolutegravir.  Madolyn Frieze, PharmD Clinical Infectious Disease Pharmacist [facility] 11/07/2012, 12:09 PM

## 2012-11-13 ENCOUNTER — Ambulatory Visit: Payer: Self-pay | Admitting: Internal Medicine

## 2012-11-20 ENCOUNTER — Telehealth: Payer: Self-pay

## 2012-11-20 ENCOUNTER — Ambulatory Visit (INDEPENDENT_AMBULATORY_CARE_PROVIDER_SITE_OTHER): Payer: Self-pay | Admitting: Internal Medicine

## 2012-11-20 ENCOUNTER — Other Ambulatory Visit: Payer: Self-pay | Admitting: *Deleted

## 2012-11-20 ENCOUNTER — Encounter: Payer: Self-pay | Admitting: Internal Medicine

## 2012-11-20 ENCOUNTER — Ambulatory Visit: Payer: Self-pay

## 2012-11-20 VITALS — BP 167/80 | HR 89 | Temp 98.4°F | Ht 66.0 in | Wt 139.8 lb

## 2012-11-20 DIAGNOSIS — B20 Human immunodeficiency virus [HIV] disease: Secondary | ICD-10-CM

## 2012-11-20 NOTE — Telephone Encounter (Signed)
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

## 2012-11-20 NOTE — Progress Notes (Signed)
Patient ID: Darryl Berg, male   DOB: 17-Dec-1975, 37 y.o.   MRN: [id number]          [facility] for Infectious Disease  Patient Active Problem List  Diagnosis  . TB, PULMONARY NOS, UNSPECIFIED  . DISEASE, MYCOBACTERIAL NEC  . HIV DISEASE  . ANXIETY  . ABUSE, ALCOHOL, CONTINUOUS  . DISORDER, TOBACCO USE  . DEPRESSION  . HYPERTENSION    Patient's Medications  New Prescriptions   No medications on file  Previous Medications   DARUNAVIR ETHANOLATE (PREZISTA) 800 MG TABLET    Take 1 tablet (800 mg total) by mouth daily with breakfast.   DOLUTEGRAVIR SODIUM (TIVICAY) 50 MG TABS    Take 1 tablet (50 mg total) by mouth daily.   HYDROCHLOROTHIAZIDE (HYDRODIURIL) 50 MG TABLET    Take 1 tablet (50 mg total) by mouth daily.   RITONAVIR (NORVIR) 100 MG TABS    Take 1 tablet (100 mg total) by mouth daily.   SULFAMETHOXAZOLE-TRIMETHOPRIM (BACTRIM DS) 800-160 MG PER TABLET    Take 1 tablet by mouth daily.   TENOFOVIR (VIREAD) 300 MG TABLET    Take 1 tablet (300 mg total) by mouth daily.  Modified Medications   No medications on file  Discontinued Medications   No medications on file    Subjective: JP is in for his routine visit. He has his pillbox with him today. He can list all of his new HIV medications and his pillboxes filled out correctly. Unfortunately he is not taking his hydrochlorothiazide or trimethoprim sulfamethoxazole. He initially says that the pharmacy did not send her medications but later in exam he tells me that he does have some of both at home.  Objective: Temp: 98.4 F (36.9 C) (03/24 1558) Temp src: Oral (03/24 1558) BP: 167/80 mmHg (03/24 1558) Pulse Rate: 89 (03/24 1558)  General: he is talkative and in good spirits Skin: no rash Lungs: clear Cor: regular S1 and S2 no murmurs  Lab Results HIV 1 RNA Quant (copies/mL)  Date Value  07/31/2012 12800*  07/04/2012 46143*  07/19/2011 63100*     CD4 T Cell Abs (cmm)  Date Value  07/31/2012 90*    07/04/2012 110*  07/19/2011 200*     Assessment: He seems to be off to a good start with his new antiretroviral salvage regimen but is still having difficulty understanding how to take his complete list of medications including his PCP prophylaxis and antihypertensive therapy. We will make sure that he has refills on those medications that have instructed him how to fill out his pillbox so he has 6 tablets daily.  Plan: 1. Continue current antiretroviral regimen 2. Restart hydrochlorothiazide and trimethoprim sulfamethoxazole 3. Followup after lab work in 6 weeks   Cliffton Asters, MD [facility] for Infectious Disease [facility] [phone] pager   [phone] cell 11/20/2012, 4:18 PM

## 2012-11-20 NOTE — Telephone Encounter (Signed)
Phone call to [facility] to discuss refills and delivery to the pt.  [phone])  Delivery set-up for 11/22/12 for the pt for all HIV and other medications.

## 2012-11-27 ENCOUNTER — Ambulatory Visit: Payer: Self-pay

## 2012-12-01 ENCOUNTER — Ambulatory Visit: Payer: Self-pay

## 2012-12-08 ENCOUNTER — Ambulatory Visit: Payer: Self-pay

## 2013-01-16 ENCOUNTER — Other Ambulatory Visit: Payer: Self-pay

## 2013-01-30 ENCOUNTER — Ambulatory Visit: Payer: Self-pay | Admitting: Internal Medicine

## 2013-02-15 ENCOUNTER — Other Ambulatory Visit: Payer: Self-pay

## 2013-03-01 ENCOUNTER — Other Ambulatory Visit (INDEPENDENT_AMBULATORY_CARE_PROVIDER_SITE_OTHER): Payer: Self-pay

## 2013-03-01 DIAGNOSIS — B20 Human immunodeficiency virus [HIV] disease: Secondary | ICD-10-CM

## 2013-03-01 LAB — CBC
HCT: 41.1 % (ref 39.0–52.0)
MCH: 32.7 pg (ref 26.0–34.0)
MCV: 96.7 fL (ref 78.0–100.0)
Platelets: 259 10*3/uL (ref 150–400)
RDW: 14.2 % (ref 11.5–15.5)

## 2013-03-01 LAB — COMPREHENSIVE METABOLIC PANEL
ALT: 22 U/L (ref 0–53)
AST: 34 U/L (ref 0–37)
BUN: 8 mg/dL (ref 6–23)
CO2: 27 mEq/L (ref 19–32)
Calcium: 8.5 mg/dL (ref 8.4–10.5)
Creat: 1.09 mg/dL (ref 0.50–1.35)
Total Bilirubin: 0.6 mg/dL (ref 0.3–1.2)

## 2013-03-05 LAB — HIV-1 RNA QUANT-NO REFLEX-BLD: HIV 1 RNA Quant: 30 copies/mL — ABNORMAL HIGH (ref <20)

## 2013-03-05 LAB — T-HELPER CELL (CD4) - (RCID CLINIC ONLY)
CD4 % Helper T Cell: 5 % — ABNORMAL LOW (ref 33–55)
CD4 T Cell Abs: 130 uL — ABNORMAL LOW (ref 400–2700)

## 2013-04-03 ENCOUNTER — Ambulatory Visit (INDEPENDENT_AMBULATORY_CARE_PROVIDER_SITE_OTHER): Payer: Self-pay | Admitting: Internal Medicine

## 2013-04-03 ENCOUNTER — Encounter: Payer: Self-pay | Admitting: Internal Medicine

## 2013-04-03 VITALS — BP 140/92 | HR 98 | Temp 98.4°F | Ht 66.0 in | Wt 148.0 lb

## 2013-04-03 DIAGNOSIS — F329 Major depressive disorder, single episode, unspecified: Secondary | ICD-10-CM

## 2013-04-03 MED ORDER — FLUOXETINE HCL 20 MG PO CAPS: 20.0000 mg | ORAL_CAPSULE | Freq: Every day | ORAL | Status: DC

## 2013-04-03 NOTE — Progress Notes (Signed)
Patient ID: Darryl Berg, male   DOB: 03/06/1976, 37 y.o.   MRN: [id number]          [facility] for Infectious Disease  Patient Active Problem List   Diagnosis Date Noted  . HYPERTENSION 07/01/2009    Priority: High  . HIV DISEASE 10/17/2006    Priority: High  . 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 ANXIETY 10/17/2006  . ABUSE, ALCOHOL, CONTINUOUS 10/17/2006  . DISORDER, TOBACCO USE 10/17/2006  . DEPRESSION 10/17/2006    Patient's Medications  New Prescriptions   FLUOXETINE (PROZAC) 20 MG CAPSULE    Take 1 capsule (20 mg total) by mouth daily.  Previous Medications   DARUNAVIR ETHANOLATE (PREZISTA) 800 MG TABLET    Take 1 tablet (800 mg total) by mouth daily with breakfast.   DOLUTEGRAVIR SODIUM (TIVICAY) 50 MG TABS    Take 1 tablet (50 mg total) by mouth daily.   HYDROCHLOROTHIAZIDE (HYDRODIURIL) 50 MG TABLET    Take 1 tablet (50 mg total) by mouth daily.   RITONAVIR (NORVIR) 100 MG TABS    Take 1 tablet (100 mg total) by mouth daily.   SULFAMETHOXAZOLE-TRIMETHOPRIM (BACTRIM DS) 800-160 MG PER TABLET    Take 1 tablet by mouth daily.   TENOFOVIR (VIREAD) 300 MG TABLET    Take 1 tablet (300 mg total) by mouth daily.  Modified Medications   No medications on file  Discontinued Medications   No medications on file    Subjective: Darryl Berg is in for his routine visit. He says that he is feeling much better. He does not feel as tired and run down as he did when he was on Atripla. He has been tolerating his new salvage regimen and Viread, Tivicay, Prezista and Norvir. He states that when [facility] she can his refills last week they sent him a zidovudine instead of Tivicay. Unfortunately, he did not call his pharmacy or [place]. He did not take the zidovudine. He's been out of Tivicay for the past week.  He recently changed to the day shift at work. He finds that he is drinking much less alcohol and feels much more clearheaded. However he  has found himself thinking more about his brother who died of liver problems last year. He has felt a little depressed like he did in the late 13s while living in [place]. He recalls taking Prozac for a few years at that time. He is requesting to restart Prozac now.   Review of Systems: Constitutional: negative Eyes: negative Ears, nose, mouth, throat, and face: negative Respiratory: negative Cardiovascular: negative Gastrointestinal: negative Genitourinary:negative  Past Medical History  Diagnosis Date  . HIV disease   . Depression   . Substance abuse     History  Substance Use Topics  . Smoking status: Never Smoker   . Smokeless tobacco: Never Used  . Alcohol Use: 6.0 oz/week    12 drink(s) per week    Family History  Problem Relation Age of Onset  . Hypertension Father     No Known Allergies  Objective: Temp: 98.4 F (36.9 C) (08/05 1538) Temp src: Oral (08/05 1538) BP: 140/92 mmHg (08/05 1538) Pulse Rate: 98 (08/05 1538)  General: He is in good spirits and in no distress. His adorable young daughter is with him. Oral: No oropharyngeal lesions Skin: No rash Lungs: Clear Cor: Regular S1 and S2 and no murmurs Abdomen: Soft and nontender Joints and extremities: No acute abnormality Neuro: Alert with normal  speech and conversation Mood and affect: Normal  Lab Results HIV 1 RNA Quant (copies/mL)  Date Value  03/01/2013 30*  07/31/2012 12800*  07/04/2012 54098*     CD4 T Cell Abs (cmm)  Date Value  03/01/2013 130*  07/31/2012 90*  07/04/2012 110*     Assessment: His HIV infection is under much better control with his salvage regimen. I asked him to always call us right away if there is any mixup or problems with his medications. Darryl Berg, our ID pharmacist, has spoken with him today and provided adherence counseling. We have called his pharmacy twice today to try to clarify the mixup and correct his medications.  He seems to have mild relapse of his  depression. I will restart Prozac and also will try to set him up to see our mental health counselor.  Plan: 1. We've requested that a new supply of Tivicay be shipped him overnight 2. Continue other antiretroviral medications 3. I asked him to bring in the bottle a zidovudine that was sent him 4. Start Prozac 20 mg daily 5. Mental health counseling 6. Followup in one month   Cliffton Asters, MD [facility] for Infectious Disease [facility] [phone] pager   [phone] cell 04/03/2013, 5:28 PM

## 2013-04-03 NOTE — Progress Notes (Signed)
HPI: Darryl Berg is a 38 y.o. male here for follow-up of a medication change.  He also has questions about receiving Zidovudine with his prescriptions.  Allergies: No Known Allergies  Vitals: Temp: 98.4 F (36.9 C) (08/05 1538) Temp src: Oral (08/05 1538) BP: 140/92 mmHg (08/05 1538) Pulse Rate: 98 (08/05 1538)  Past Medical History: Past Medical History  Diagnosis Date  . HIV disease     Social History: History   Social History  . Marital Status: Single    Spouse Name: N/A    Number of Children: N/A  . Years of Education: N/A   Social History Main Topics  . Smoking status: Never Smoker   . Smokeless tobacco: Never Used  . Alcohol Use: 6.0 oz/week    12 drink(s) per week  . Drug Use: No  . Sexually Active: Yes     Comment: accepted condoms   Other Topics Concern  . None   Social History Narrative  . None    Previous Regimen: Atripla  Current Regimen: Darunavir/ritonavir, Tenofovir, Dolutegravir  Labs: HIV 1 RNA Quant (copies/mL)  Date Value  03/01/2013 30*  07/31/2012 12800*  07/04/2012 16109*     CD4 T Cell Abs (cmm)  Date Value  03/01/2013 130*  07/31/2012 90*  07/04/2012 110*     Hep B S Ab (no units)  Date Value  10/24/2006 Yes      Hepatitis B Surface Ag (no units)  Date Value  10/24/2006 No      HCV Ab (no units)  Date Value  10/24/2006 No     CrCl: Estimated Creatinine Clearance: 83.7 ml/min (by C-G formula based on Cr of 1.09).  Lipids:    Component Value Date/Time   CHOL 182 07/31/2012 1531   TRIG 82 07/31/2012 1531   HDL 80 07/31/2012 1531   CHOLHDL 2.3 07/31/2012 1531   VLDL 16 07/31/2012 1531   LDLCALC 86 07/31/2012 1531    Assessment: HIV - his viral load and CD4 count have improved with his regimen change.  He identified 3 of his HIV medications correctly on a chart, with the exception of Viread (he selected another blue tablet) and endorses that he takes them daily with breakfast.  He does not report any missed doses in  the past 2 weeks.  He does sometimes take his medications late when he is at work as he does not want to be seen taking medications.    He also reports that he received a prescription for Zidovudine BID with his prescriptions this month.  He recognized this did not match his list from the [facility] nor did the directions match what we had told him. Darryl Berg called [facility] who reports they did not fill or send Zidovudine to Darryl Berg.  I instructed him to dispose of the Zidovudine.  Recommendations: Adherence counseling performed.   Continue Dolutegravir, Tenofovir, Darunavir/ritonavir Follow-up at next visit for adherence and virologic control.  Darryl Berg, PharmD Clinical Infectious Disease Pharmacist [facility] for Infectious Disease 04/03/2013, 3:57 PM

## 2013-05-01 ENCOUNTER — Ambulatory Visit: Payer: Self-pay | Admitting: Internal Medicine

## 2013-06-12 ENCOUNTER — Ambulatory Visit: Payer: Self-pay | Admitting: Internal Medicine

## 2013-06-13 ENCOUNTER — Telehealth: Payer: Self-pay | Admitting: *Deleted

## 2013-06-13 ENCOUNTER — Other Ambulatory Visit: Payer: Self-pay | Admitting: Licensed Clinical Social Worker

## 2013-06-13 DIAGNOSIS — B20 Human immunodeficiency virus [HIV] disease: Secondary | ICD-10-CM

## 2013-06-13 DIAGNOSIS — F329 Major depressive disorder, single episode, unspecified: Secondary | ICD-10-CM

## 2013-06-13 MED ORDER — FLUOXETINE HCL 20 MG PO CAPS: 20.0000 mg | ORAL_CAPSULE | Freq: Every day | ORAL | Status: DC

## 2013-06-13 MED ORDER — SULFAMETHOXAZOLE-TMP DS 800-160 MG PO TABS: 1.0000 | ORAL_TABLET | Freq: Every day | ORAL | Status: DC

## 2013-06-13 MED ORDER — HYDROCHLOROTHIAZIDE 50 MG PO TABS: 50.0000 mg | ORAL_TABLET | Freq: Every day | ORAL | Status: DC

## 2013-06-13 NOTE — Telephone Encounter (Signed)
Called and left a V/M for Darryl Berg to return my call concerning help with his medications.

## 2013-06-13 NOTE — Telephone Encounter (Signed)
I spoke with Mr. Atiyeh today.  He needs his HCTZ, Bactrim DS and Prozac filled.  He is waiting for ADAP approval.  I checked the Patient Assistance Programs for these medications.  It was going to cost him $20 for each of the prescriptions.  I checked with the Outpatient pharmacy to see what the cost would be.  Crystal told me it would be cheaper at [facility] or [facility].  I told Mr. Palka. He wanted them called into [facility] pharmacy. I transferred him back to Triage to get them called in.

## 2013-06-19 ENCOUNTER — Other Ambulatory Visit: Payer: Self-pay | Admitting: *Deleted

## 2013-06-19 DIAGNOSIS — B20 Human immunodeficiency virus [HIV] disease: Secondary | ICD-10-CM

## 2013-06-19 MED ORDER — TENOFOVIR DISOPROXIL FUMARATE 300 MG PO TABS: 300.0000 mg | ORAL_TABLET | Freq: Every day | ORAL | Status: DC

## 2013-06-19 MED ORDER — DARUNAVIR ETHANOLATE 800 MG PO TABS: 800.0000 mg | ORAL_TABLET | Freq: Every day | ORAL | Status: DC

## 2013-06-19 MED ORDER — DOLUTEGRAVIR SODIUM 50 MG PO TABS: 50.0000 mg | ORAL_TABLET | Freq: Every day | ORAL | Status: DC

## 2013-06-19 MED ORDER — RITONAVIR 100 MG PO TABS: 100.0000 mg | ORAL_TABLET | Freq: Every day | ORAL | Status: DC

## 2013-06-25 ENCOUNTER — Ambulatory Visit (INDEPENDENT_AMBULATORY_CARE_PROVIDER_SITE_OTHER): Payer: Self-pay | Admitting: Internal Medicine

## 2013-06-25 ENCOUNTER — Encounter: Payer: Self-pay | Admitting: Internal Medicine

## 2013-06-25 VITALS — BP 148/93 | HR 93 | Temp 97.9°F | Ht 66.0 in | Wt 148.5 lb

## 2013-06-25 DIAGNOSIS — Z23 Encounter for immunization: Secondary | ICD-10-CM

## 2013-06-25 DIAGNOSIS — B20 Human immunodeficiency virus [HIV] disease: Secondary | ICD-10-CM

## 2013-06-25 LAB — CBC
HCT: 40.5 % (ref 39.0–52.0)
Hemoglobin: 14.1 g/dL (ref 13.0–17.0)
MCH: 34.6 pg — ABNORMAL HIGH (ref 26.0–34.0)
MCV: 99.3 fL (ref 78.0–100.0)
RBC: 4.08 MIL/uL — ABNORMAL LOW (ref 4.22–5.81)
WBC: 5.2 10*3/uL (ref 4.0–10.5)

## 2013-06-25 NOTE — Progress Notes (Signed)
Patient ID: Darryl Berg, male   DOB: [birth year], 37 y.o.   MRN: [id number]          [facility] for Infectious Disease  Patient Active Problem List   Diagnosis Date Noted  . HYPERTENSION 07/01/2009    Priority: High  . HIV DISEASE 10/17/2006    Priority: High  . 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 ANXIETY 10/17/2006  . ABUSE, ALCOHOL, CONTINUOUS 10/17/2006  . DISORDER, TOBACCO USE 10/17/2006  . DEPRESSION 10/17/2006    Patient's Medications  New Prescriptions   No medications on file  Previous Medications   DARUNAVIR ETHANOLATE (PREZISTA) 800 MG TABLET    Take 1 tablet (800 mg total) by mouth daily with breakfast.   DOLUTEGRAVIR (TIVICAY) 50 MG TABLET    Take 1 tablet (50 mg total) by mouth daily.   FLUOXETINE (PROZAC) 20 MG CAPSULE    Take 1 capsule (20 mg total) by mouth daily.   HYDROCHLOROTHIAZIDE (HYDRODIURIL) 50 MG TABLET    Take 1 tablet (50 mg total) by mouth daily.   RITONAVIR (NORVIR) 100 MG TABS TABLET    Take 1 tablet (100 mg total) by mouth daily.   SULFAMETHOXAZOLE-TRIMETHOPRIM (BACTRIM DS) 800-160 MG PER TABLET    Take 1 tablet by mouth daily.   TENOFOVIR (VIREAD) 300 MG TABLET    Take 1 tablet (300 mg total) by mouth daily.  Modified Medications   No medications on file  Discontinued Medications   No medications on file    Subjective: Darryl Berg is in for his routine visit. His ADAP recertification was late but he was able to get a short supply of medication from the [facility] pharmacy so he states that he did not miss any doses of his HIV medications. He can name his medications and take them off of the chart. He says he is feeling much better since restarting his medications. Review of Systems: Pertinent items are noted in HPI.  Past Medical History  Diagnosis Date  . HIV disease   . Depression   . Substance abuse     History  Substance Use Topics  . Smoking status: Never Smoker   . Smokeless  tobacco: Never Used  . Alcohol Use: 6.0 oz/week    12 drink(s) per week    Family History  Problem Relation Age of Onset  . Hypertension Father     No Known Allergies  Objective: Temp: 97.9 F (36.6 C) (10/27 1622) Temp src: Oral (10/27 1622) BP: 148/93 mmHg (10/27 1622) Pulse Rate: 93 (10/27 1622)  General: He is in very good spirits. His daughter is with him today Oral: No oropharyngeal lesions Skin: No rash Lungs: Clear Cor: Regular S1 and S2 with no murmurs Mood and affect: Normal  Lab Results HIV 1 RNA Quant (copies/mL)  Date Value  03/01/2013 30*  07/31/2012 12800*  07/04/2012 40981*     CD4 T Cell Abs (cmm)  Date Value  03/01/2013 130*  07/31/2012 90*  07/04/2012 110*     Assessment: His adherence has improved significantly over the past several months and his HIV infection is coming under much better control.  Plan: 1. Continue current antiretroviral regimen 2. Repeat lab work today 3. Influenza vaccination today 4. Followup in 3 months   Cliffton Asters, MD [facility] for Infectious Disease [facility] [phone] pager   [phone] cell 06/25/2013, 4:43 PM

## 2013-06-26 LAB — COMPREHENSIVE METABOLIC PANEL
AST: 39 U/L — ABNORMAL HIGH (ref 0–37)
Albumin: 4.4 g/dL (ref 3.5–5.2)
CO2: 26 mEq/L (ref 19–32)
Glucose, Bld: 75 mg/dL (ref 70–99)
Sodium: 135 mEq/L (ref 135–145)
Total Bilirubin: 0.4 mg/dL (ref 0.3–1.2)
Total Protein: 8.3 g/dL (ref 6.0–8.3)

## 2013-06-26 LAB — HIV-1 RNA QUANT-NO REFLEX-BLD: HIV 1 RNA Quant: <20 copies/mL (ref <20)

## 2013-06-27 LAB — T-HELPER CELL (CD4) - (RCID CLINIC ONLY)
CD4 % Helper T Cell: 6 % — ABNORMAL LOW (ref 33–55)
CD4 T Cell Abs: 210 /uL — ABNORMAL LOW (ref 400–2700)

## 2013-09-26 ENCOUNTER — Ambulatory Visit: Payer: Self-pay | Admitting: Internal Medicine

## 2013-10-31 ENCOUNTER — Ambulatory Visit: Payer: Self-pay | Admitting: Internal Medicine

## 2013-11-12 ENCOUNTER — Ambulatory Visit: Payer: Self-pay

## 2013-11-12 ENCOUNTER — Encounter: Payer: Self-pay | Admitting: *Deleted

## 2013-11-13 ENCOUNTER — Other Ambulatory Visit: Payer: Self-pay | Admitting: *Deleted

## 2013-11-13 DIAGNOSIS — B20 Human immunodeficiency virus [HIV] disease: Secondary | ICD-10-CM

## 2013-11-13 MED ORDER — DARUNAVIR ETHANOLATE 800 MG PO TABS: 800.0000 mg | ORAL_TABLET | Freq: Every day | ORAL | Status: DC

## 2013-11-13 MED ORDER — TENOFOVIR DISOPROXIL FUMARATE 300 MG PO TABS
300.0000 mg | ORAL_TABLET | Freq: Every day | ORAL | Status: DC
Start: 2013-11-13 — End: 2013-11-26

## 2013-11-13 MED ORDER — DOLUTEGRAVIR SODIUM 50 MG PO TABS: 50.0000 mg | ORAL_TABLET | Freq: Every day | ORAL | Status: DC

## 2013-11-13 MED ORDER — RITONAVIR 100 MG PO TABS
100.0000 mg | ORAL_TABLET | Freq: Every day | ORAL | Status: DC
Start: 2013-11-13 — End: 2013-11-26

## 2013-11-26 ENCOUNTER — Other Ambulatory Visit: Payer: Self-pay | Admitting: *Deleted

## 2013-11-26 ENCOUNTER — Telehealth: Payer: Self-pay | Admitting: *Deleted

## 2013-11-26 DIAGNOSIS — B20 Human immunodeficiency virus [HIV] disease: Secondary | ICD-10-CM

## 2013-11-26 MED ORDER — DARUNAVIR ETHANOLATE 800 MG PO TABS: 800.0000 mg | ORAL_TABLET | Freq: Every day | ORAL | Status: DC

## 2013-11-26 MED ORDER — RITONAVIR 100 MG PO TABS: 100.0000 mg | ORAL_TABLET | Freq: Every day | ORAL | Status: DC

## 2013-11-26 MED ORDER — TENOFOVIR DISOPROXIL FUMARATE 300 MG PO TABS: 300.0000 mg | ORAL_TABLET | Freq: Every day | ORAL | Status: DC

## 2013-11-26 MED ORDER — DOLUTEGRAVIR SODIUM 50 MG PO TABS: 50.0000 mg | ORAL_TABLET | Freq: Every day | ORAL | Status: DC

## 2013-11-26 NOTE — Telephone Encounter (Signed)
Left message requesting pt call for appt w/ Dr. Orvan Falconerampbell.

## 2014-01-08 ENCOUNTER — Telehealth: Payer: Self-pay | Admitting: *Deleted

## 2014-01-08 NOTE — Telephone Encounter (Signed)
[facility], that they are unable to reach the patient to coordinate delivery, and will not attempt further deliveries until they hear from the patient.  Patient has missed the reapplication period for ADAP the last 2 times, has no-showed appointments - [facility]. Andree CossMichelle M Calen Posch, RN

## 2014-01-16 ENCOUNTER — Other Ambulatory Visit: Payer: Self-pay

## 2014-01-17 ENCOUNTER — Other Ambulatory Visit (INDEPENDENT_AMBULATORY_CARE_PROVIDER_SITE_OTHER): Payer: Self-pay

## 2014-01-17 DIAGNOSIS — Z79899 Other long term (current) drug therapy: Secondary | ICD-10-CM

## 2014-01-17 DIAGNOSIS — B20 Human immunodeficiency virus [HIV] disease: Secondary | ICD-10-CM

## 2014-01-17 DIAGNOSIS — Z113 Encounter for screening for infections with a predominantly sexual mode of transmission: Secondary | ICD-10-CM

## 2014-01-17 LAB — LIPID PANEL
CHOL/HDL RATIO: 2.3 ratio
CHOLESTEROL: 162 mg/dL (ref 0–200)
HDL: 72 mg/dL (ref >39)
LDL Cholesterol: 66 mg/dL (ref 0–99)
TRIGLYCERIDES: 119 mg/dL (ref <150)
VLDL: 24 mg/dL (ref 0–40)

## 2014-01-18 LAB — T-HELPER CELL (CD4) - (RCID CLINIC ONLY)
CD4 % Helper T Cell: 8 % — ABNORMAL LOW (ref 33–55)
CD4 T Cell Abs: 180 /uL — ABNORMAL LOW (ref 400–2700)

## 2014-01-18 LAB — RPR

## 2014-01-19 LAB — HIV-1 RNA QUANT-NO REFLEX-BLD: HIV 1 RNA Quant: <20 copies/mL (ref <20)

## 2014-01-31 ENCOUNTER — Ambulatory Visit (INDEPENDENT_AMBULATORY_CARE_PROVIDER_SITE_OTHER): Payer: Self-pay | Admitting: Internal Medicine

## 2014-01-31 ENCOUNTER — Encounter: Payer: Self-pay | Admitting: Internal Medicine

## 2014-01-31 VITALS — BP 116/88 | HR 80 | Temp 98.3°F | Ht 66.0 in | Wt 153.0 lb

## 2014-01-31 DIAGNOSIS — B20 Human immunodeficiency virus [HIV] disease: Secondary | ICD-10-CM

## 2014-01-31 NOTE — Progress Notes (Signed)
Patient ID: Darryl Berg, male   DOB: 06/24/1976, 38 y.o.   MRN: [id number]          Patient Active Problem List   Diagnosis Date Noted  . HYPERTENSION 07/01/2009    Priority: High  . HIV DISEASE 10/17/2006    Priority: High  . 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 ANXIETY 10/17/2006  . ABUSE, ALCOHOL, CONTINUOUS 10/17/2006  . DISORDER, TOBACCO USE 10/17/2006  . DEPRESSION 10/17/2006    Patient's Medications  New Prescriptions   No medications on file  Previous Medications   DARUNAVIR ETHANOLATE (PREZISTA) 800 MG TABLET    Take 1 tablet (800 mg total) by mouth daily with breakfast.   DOLUTEGRAVIR (TIVICAY) 50 MG TABLET    Take 1 tablet (50 mg total) by mouth daily.   HYDROCHLOROTHIAZIDE (HYDRODIURIL) 50 MG TABLET    Take 1 tablet (50 mg total) by mouth daily.   RITONAVIR (NORVIR) 100 MG TABS TABLET    Take 1 tablet (100 mg total) by mouth daily.   SULFAMETHOXAZOLE-TRIMETHOPRIM (BACTRIM DS) 800-160 MG PER TABLET    Take 1 tablet by mouth daily.   TENOFOVIR (VIREAD) 300 MG TABLET    Take 1 tablet (300 mg total) by mouth daily.  Modified Medications   No medications on file  Discontinued Medications   FLUOXETINE (PROZAC) 20 MG CAPSULE    Take 1 capsule (20 mg total) by mouth daily.    Subjective: Darryl Berg is in for his routine visit. He thinks his only missed one to 2 doses of his medications since his last visit. He is feeling well. He never started Prozac because he no longer felt depressed and did not feel like he needed it. Review of Systems: Pertinent items are noted in HPI.  Past Medical History  Diagnosis Date  . HIV disease   . Depression   . Substance abuse     History  Substance Use Topics  . Smoking status: Never Smoker   . Smokeless tobacco: Never Used  . Alcohol Use: 6.0 oz/week    12 drink(s) per week     Comment: weekends    Family History  Problem Relation Age of Onset  . Hypertension Father     No  Known Allergies  Objective: Temp: 98.3 F (36.8 C) (06/04 1608) Temp src: Oral (06/04 1608) BP: 116/88 mmHg (06/04 1608) Pulse Rate: 80 (06/04 1608) Body mass index is 24.71 kg/(m^2).  General: He is smiling and in good spirits Oral: No oropharyngeal lesions Skin: No rash Lungs: Clear Cor: Regular S1-S2 no murmurs   Lab Results Lab Results  Component Value Date   WBC 5.2 06/25/2013   HGB 14.1 06/25/2013   HCT 40.5 06/25/2013   MCV 99.3 06/25/2013   PLT 284 06/25/2013    Lab Results  Component Value Date   CREATININE 0.89 06/25/2013   BUN 12 06/25/2013   NA 135 06/25/2013   K 4.2 06/25/2013   CL 98 06/25/2013   CO2 26 06/25/2013    Lab Results  Component Value Date   ALT 25 06/25/2013   AST 39* 06/25/2013   ALKPHOS 54 06/25/2013   BILITOT 0.4 06/25/2013    Lab Results  Component Value Date   CHOL 162 01/17/2014   HDL 72 01/17/2014   LDLCALC 66 01/17/2014   TRIG 119 01/17/2014   CHOLHDL 2.3 01/17/2014    Lab Results HIV 1 RNA Quant (copies/mL)  Date Value  01/17/2014 <20  06/25/2013 <20   03/01/2013 30*     CD4 T Cell Abs (/uL)  Date Value  01/17/2014 180*  06/25/2013 210*  03/01/2013 130*     Assessment: His infection has come under much better control with better adherence over the past year.  Plan: 1. Continue current antiretroviral regimen 2. Followup after lab work in 6 months   Cliffton Asters, MD [facility] for Infectious Disease [facility] [phone] pager   [phone] cell 01/31/2014, 5:11 PM

## 2014-04-17 ENCOUNTER — Encounter: Payer: Self-pay | Admitting: *Deleted

## 2014-05-23 ENCOUNTER — Other Ambulatory Visit: Payer: Self-pay | Admitting: *Deleted

## 2014-05-23 DIAGNOSIS — B20 Human immunodeficiency virus [HIV] disease: Secondary | ICD-10-CM

## 2014-05-23 MED ORDER — RITONAVIR 100 MG PO TABS: 100.0000 mg | ORAL_TABLET | Freq: Every day | ORAL | Status: DC

## 2014-05-23 MED ORDER — TENOFOVIR DISOPROXIL FUMARATE 300 MG PO TABS: 300.0000 mg | ORAL_TABLET | Freq: Every day | ORAL | Status: DC

## 2014-05-23 MED ORDER — DARUNAVIR ETHANOLATE 800 MG PO TABS: 800.0000 mg | ORAL_TABLET | Freq: Every day | ORAL | Status: DC

## 2014-05-23 MED ORDER — DOLUTEGRAVIR SODIUM 50 MG PO TABS: 50.0000 mg | ORAL_TABLET | Freq: Every day | ORAL | Status: DC

## 2014-05-23 NOTE — Telephone Encounter (Signed)
ADAP Application 

## 2014-06-14 ENCOUNTER — Other Ambulatory Visit: Payer: Self-pay | Admitting: Internal Medicine

## 2014-06-14 DIAGNOSIS — F329 Major depressive disorder, single episode, unspecified: Secondary | ICD-10-CM

## 2014-06-14 DIAGNOSIS — F32A Depression, unspecified: Secondary | ICD-10-CM

## 2014-06-14 DIAGNOSIS — B20 Human immunodeficiency virus [HIV] disease: Secondary | ICD-10-CM

## 2014-07-31 ENCOUNTER — Other Ambulatory Visit (INDEPENDENT_AMBULATORY_CARE_PROVIDER_SITE_OTHER): Payer: Self-pay

## 2014-07-31 DIAGNOSIS — B20 Human immunodeficiency virus [HIV] disease: Secondary | ICD-10-CM

## 2014-08-01 ENCOUNTER — Other Ambulatory Visit: Payer: Self-pay

## 2014-08-01 LAB — T-HELPER CELL (CD4) - (RCID CLINIC ONLY)
CD4 % Helper T Cell: 10 % — ABNORMAL LOW (ref 33–55)
CD4 T CELL ABS: 250 /uL — AB (ref 400–2700)

## 2014-08-02 LAB — HIV-1 RNA QUANT-NO REFLEX-BLD

## 2014-08-15 ENCOUNTER — Ambulatory Visit (INDEPENDENT_AMBULATORY_CARE_PROVIDER_SITE_OTHER): Payer: Self-pay | Admitting: Internal Medicine

## 2014-08-15 ENCOUNTER — Ambulatory Visit: Payer: Self-pay | Admitting: Internal Medicine

## 2014-08-15 ENCOUNTER — Encounter: Payer: Self-pay | Admitting: Internal Medicine

## 2014-08-15 VITALS — BP 148/97 | HR 80 | Temp 98.2°F | Wt 169.2 lb

## 2014-08-15 DIAGNOSIS — B20 Human immunodeficiency virus [HIV] disease: Secondary | ICD-10-CM

## 2014-08-15 DIAGNOSIS — Z23 Encounter for immunization: Secondary | ICD-10-CM

## 2014-08-15 MED ORDER — DARUNAVIR-COBICISTAT 800-150 MG PO TABS: 1.0000 | ORAL_TABLET | Freq: Every day | ORAL | Status: DC

## 2014-08-15 NOTE — Progress Notes (Signed)
Patient ID: Darryl Berg, male   DOB: 04/09/1976, 38 y.o.   MRN: [id number]          Patient Active Problem List   Diagnosis Date Noted  . HYPERTENSION 07/01/2009    Priority: High  . Human immunodeficiency virus (HIV) disease 10/17/2006    Priority: High  . 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 ANXIETY 10/17/2006  . ABUSE, ALCOHOL, CONTINUOUS 10/17/2006  . DISORDER, TOBACCO USE 10/17/2006  . DEPRESSION 10/17/2006    Patient's Medications  New Prescriptions   DARUNAVIR-COBICISTAT (PREZCOBIX) 800-150 MG PER TABLET    Take 1 tablet by mouth daily. Swallow whole. Do NOT crush, break or chew tablets. Take with food.  Previous Medications   DOLUTEGRAVIR (TIVICAY) 50 MG TABLET    Take 1 tablet (50 mg total) by mouth daily.   HYDROCHLOROTHIAZIDE (HYDRODIURIL) 50 MG TABLET    TAKE 1 TABLET BY MOUTH EVERY DAY   TENOFOVIR (VIREAD) 300 MG TABLET    Take 1 tablet (300 mg total) by mouth daily.  Modified Medications   No medications on file  Discontinued Medications   DARUNAVIR ETHANOLATE (PREZISTA) 800 MG TABLET    Take 1 tablet (800 mg total) by mouth daily with breakfast.   FLUOXETINE (PROZAC) 20 MG CAPSULE    TAKE ONE CAPSULE BY MOUTH EVERY DAY   RITONAVIR (NORVIR) 100 MG TABS TABLET    Take 1 tablet (100 mg total) by mouth daily.   SULFAMETHOXAZOLE-TRIMETHOPRIM (BACTRIM DS) 800-160 MG PER TABLET    TAKE 1 TABLET BY MOUTH DAILY    Subjective: JP is in for his routine visit. He states that he is feeling much better over the past year. He has stopped drinking alcohol and he is eating better. His family life is wonderful. He states he has not missed doses of his medications. He pulls his pillbox out of his pocket to proudly showed me how he stays organized. He can name all of his medications and described taking them correctly. He has not been feeling depressed. Review of Systems: Constitutional: negative Eyes: negative Ears, nose, mouth,  throat, and face: negative Respiratory: negative Cardiovascular: negative Gastrointestinal: negative Genitourinary:negative  Past Medical History  Diagnosis Date  . HIV disease   . Depression   . Substance abuse     History  Substance Use Topics  . Smoking status: Never Smoker   . Smokeless tobacco: Never Used  . Alcohol Use: 6.0 oz/week    12 drink(s) per week     Comment: weekends    Family History  Problem Relation Age of Onset  . Hypertension Father     No Known Allergies  Objective: Temp: 98.2 F (36.8 C) (12/17 1618) Temp Source: Oral (12/17 1618) BP: 148/97 mmHg (12/17 1621) Pulse Rate: 80 (12/17 1621) Body mass index is 27.33 kg/(m^2).  General: He is smiling and in very good spirits. He is more talkative than usual Oral: No oropharyngeal lesions Skin: No rash. He does have a 10 mm hyperpigmented nodule on his right forearm that has been there several months. It is not been changing in size. It is nontender Lungs: Clear Cor: Regular S1 and S2 with no murmurs Abdomen: Soft and nontender Joints and extremities: Normal Neuro: Alert with normal speech and conversation Mood: Much better eye contact and bright affect  Lab Results Lab Results  Component Value Date   WBC 5.2 06/25/2013   HGB 14.1 06/25/2013   HCT 40.5 06/25/2013  MCV 99.3 06/25/2013   PLT 284 06/25/2013    Lab Results  Component Value Date   CREATININE 0.89 06/25/2013   BUN 12 06/25/2013   NA 135 06/25/2013   K 4.2 06/25/2013   CL 98 06/25/2013   CO2 26 06/25/2013    Lab Results  Component Value Date   ALT 25 06/25/2013   AST 39* 06/25/2013   ALKPHOS 54 06/25/2013   BILITOT 0.4 06/25/2013    Lab Results  Component Value Date   CHOL 162 01/17/2014   HDL 72 01/17/2014   LDLCALC 66 01/17/2014   TRIG 119 01/17/2014   CHOLHDL 2.3 01/17/2014    Lab Results HIV 1 RNA QUANT (copies/mL)  Date Value  07/31/2014 <20  01/17/2014 <20  06/25/2013 <20   CD4 T CELL ABS (/uL)    Date Value  07/31/2014 250*  01/17/2014 180*  06/25/2013 210*     Assessment: JP is doing remarkably better over the past year and one half. His adherence has improved and his HIV infection is coming under much better control. He can stop pneumocystis prophylaxis now and I will simplify his antiretroviral regimen by consolidating Prezista and Norvir to Prezcobix. He will continue Viread and Tivicay.  I congratulated him on his ability to stop drinking alcohol. That is certainly helped him and he is no longer feeling depressed.  His blood pressure remained slightly up but I will continue hydrochlorothiazide for now and see him back in 3 months.   Plan: Continue Viread and Tivicay; start Prezcobix Discontinue trimethoprim sulfamethoxazole Continue hydrochlorothiazide Follow-up after lab work in 3 months  Cliffton AstersJohn Aquilla Shambley, MD [facility] [phone] pager   [phone] cell 08/15/2014, 5:14 PM

## 2014-08-15 NOTE — Progress Notes (Signed)
Patient ID: Darryl Berg, male   DOB: 01/15/1976, 38 y.o.   MRN: [id number] HPI: Darryl Berg is a 38 y.o. male who is here for his HIV f/u.   Allergies: No Known Allergies  Vitals: Temp: 98.2 F (36.8 C) (12/17 1618) Temp Source: Oral (12/17 1618) BP: 148/97 mmHg (12/17 1621) Pulse Rate: 80 (12/17 1621)  Past Medical History: Past Medical History  Diagnosis Date  . HIV disease   . Depression   . Substance abuse     Social History: History   Social History  . Marital Status: Single    Spouse Name: N/A    Number of Children: N/A  . Years of Education: N/A   Social History Main Topics  . Smoking status: Never Smoker   . Smokeless tobacco: Never Used  . Alcohol Use: 6.0 oz/week    12 drink(s) per week     Comment: weekends  . Drug Use: No  . Sexual Activity: Yes     Comment: accepted condoms   Other Topics Concern  . None   Social History Narrative    HIV Genotype Composite Data Genotype Dates:   Mutations in PinalBold impact drug susceptibility RT Mutations M184V, V90I, K103N, P225H  PI Mutations L90M, K20I, A71IT  Integrase Mutations    Interpretation of Genotype Data per [employer] HIV Database Nucleoside RTIs  lamivudine (3TC) High-level resistance abacavir (ABC) Low-level resistance zidovudine (AZT) Susceptible stavudine (D4T) Susceptible didanosine (DDI) Potential low-level resistance emtricitabine (FTC) High-level resistance tenofovir (TDF) Susceptible   Non-Nucleoside RTIs  efavirenz (EFV) High-level resistance etravirine (ETR) Susceptible nevirapine (NVP) High-level resistance rilpivirine (RPV) Susceptible   Protease Inhibitors  atazanavir/r (ATV/r) Intermediate resistance darunavir/r (DRV/r) Susceptible fosamprenavir/r (FPV/r) Low-level resistance indinavir/r (IDV/r) Intermediate resistance lopinavir/r (LPV/r) Low-level resistance nelfinavir (NFV) High-level resistance saquinavir/r (SQV/r) Intermediate resistance tipranavir/r  (TPV/r) Susceptible   Integrase Inhibitors     Current Regimen: DTG+TDF+DRV/r  Labs: HIV 1 RNA QUANT (copies/mL)  Date Value  07/31/2014 <20  01/17/2014 <20  06/25/2013 <20   CD4 T CELL ABS (/uL)  Date Value  07/31/2014 250*  01/17/2014 180*  06/25/2013 210*   HEP B S AB (no units)  Date Value  10/24/2006 Yes   HEPATITIS B SURFACE AG (no units)  Date Value  10/24/2006 No   HCV AB (no units)  Date Value  10/24/2006 No    CrCl: CrCl cannot be calculated (Unknown ideal weight.).  Lipids:    Component Value Date/Time   CHOL 162 01/17/2014 1632   TRIG 119 01/17/2014 1632   HDL 72 01/17/2014 1632   CHOLHDL 2.3 01/17/2014 1632   VLDL 24 01/17/2014 1632   LDLCALC 66 01/17/2014 1632    Assessment: Darryl Berg is her for his f/u of his HIV. After reviewing his profile, he has a tremendous amount of resistance. He has very few option left for this therapy. I really stressed this to him during our conversation. Since he is doing well on this regimen and we don't have a lot of options left, we are just going to simplify DRV/r to Prezcobix and keep the others the same.   Recommendations: Change DRV/r to Prezcobix Cont DTG and TDF  Darryl Berg, Darryl Berg, PharmD Clinical Infectious Disease Pharmacist [facility] 08/15/2014, 4:45 PM

## 2014-11-19 ENCOUNTER — Other Ambulatory Visit: Payer: Self-pay | Admitting: Internal Medicine

## 2014-12-31 ENCOUNTER — Other Ambulatory Visit: Payer: Self-pay | Admitting: *Deleted

## 2014-12-31 DIAGNOSIS — B20 Human immunodeficiency virus [HIV] disease: Secondary | ICD-10-CM

## 2014-12-31 MED ORDER — TENOFOVIR DISOPROXIL FUMARATE 300 MG PO TABS: 300.0000 mg | ORAL_TABLET | Freq: Every day | ORAL | Status: DC

## 2014-12-31 MED ORDER — DARUNAVIR-COBICISTAT 800-150 MG PO TABS: 1.0000 | ORAL_TABLET | Freq: Every day | ORAL | Status: DC

## 2014-12-31 MED ORDER — DOLUTEGRAVIR SODIUM 50 MG PO TABS: 50.0000 mg | ORAL_TABLET | Freq: Every day | ORAL | Status: DC

## 2015-02-10 ENCOUNTER — Other Ambulatory Visit: Payer: Self-pay | Admitting: Internal Medicine

## 2015-02-10 DIAGNOSIS — B20 Human immunodeficiency virus [HIV] disease: Secondary | ICD-10-CM

## 2015-02-11 MED ORDER — DARUNAVIR-COBICISTAT 800-150 MG PO TABS: 1.0000 | ORAL_TABLET | Freq: Every day | ORAL | Status: DC

## 2015-03-10 ENCOUNTER — Other Ambulatory Visit: Payer: Self-pay

## 2015-03-24 ENCOUNTER — Ambulatory Visit: Payer: Self-pay | Admitting: Internal Medicine

## 2015-03-27 ENCOUNTER — Other Ambulatory Visit (INDEPENDENT_AMBULATORY_CARE_PROVIDER_SITE_OTHER): Payer: Self-pay

## 2015-03-27 DIAGNOSIS — Z79899 Other long term (current) drug therapy: Secondary | ICD-10-CM

## 2015-03-27 DIAGNOSIS — Z113 Encounter for screening for infections with a predominantly sexual mode of transmission: Secondary | ICD-10-CM

## 2015-03-27 DIAGNOSIS — B20 Human immunodeficiency virus [HIV] disease: Secondary | ICD-10-CM

## 2015-03-27 LAB — COMPREHENSIVE METABOLIC PANEL
ALBUMIN: 4.3 g/dL (ref 3.6–5.1)
ALT: 26 U/L (ref 9–46)
AST: 29 U/L (ref 10–40)
Alkaline Phosphatase: 44 U/L (ref 40–115)
BILIRUBIN TOTAL: 0.4 mg/dL (ref 0.2–1.2)
BUN: 14 mg/dL (ref 7–25)
CHLORIDE: 97 meq/L — AB (ref 98–110)
CO2: 26 meq/L (ref 20–31)
Calcium: 9.1 mg/dL (ref 8.6–10.3)
Creat: 1.19 mg/dL (ref 0.60–1.35)
Glucose, Bld: 84 mg/dL (ref 65–99)
POTASSIUM: 4.1 meq/L (ref 3.5–5.3)
Sodium: 137 mEq/L (ref 135–146)
Total Protein: 7.6 g/dL (ref 6.1–8.1)

## 2015-03-27 LAB — CBC
HCT: 41.5 % (ref 39.0–52.0)
HEMOGLOBIN: 13.8 g/dL (ref 13.0–17.0)
MCH: 31.3 pg (ref 26.0–34.0)
MCHC: 33.3 g/dL (ref 30.0–36.0)
MCV: 94.1 fL (ref 78.0–100.0)
MPV: 8.8 fL (ref 8.6–12.4)
PLATELETS: 303 10*3/uL (ref 150–400)
RBC: 4.41 MIL/uL (ref 4.22–5.81)
RDW: 14.5 % (ref 11.5–15.5)
WBC: 4.8 10*3/uL (ref 4.0–10.5)

## 2015-03-27 LAB — LIPID PANEL
Cholesterol: 170 mg/dL (ref 125–200)
HDL: 50 mg/dL (ref >=40)
LDL Cholesterol: 68 mg/dL (ref <130)
Total CHOL/HDL Ratio: 3.4 Ratio (ref <=5.0)
Triglycerides: 261 mg/dL — ABNORMAL HIGH (ref <150)
VLDL: 52 mg/dL — ABNORMAL HIGH (ref <30)

## 2015-03-28 LAB — RPR

## 2015-03-28 LAB — T-HELPER CELL (CD4) - (RCID CLINIC ONLY)
CD4 T CELL HELPER: 10 % — AB (ref 33–55)
CD4 T Cell Abs: 300 /uL — ABNORMAL LOW (ref 400–2700)

## 2015-03-31 LAB — HIV-1 RNA QUANT-NO REFLEX-BLD: HIV 1 RNA Quant: <20 copies/mL (ref <20)

## 2015-04-07 ENCOUNTER — Encounter: Payer: Self-pay | Admitting: *Deleted

## 2015-04-09 ENCOUNTER — Ambulatory Visit: Payer: Self-pay

## 2015-04-10 ENCOUNTER — Encounter: Payer: Self-pay | Admitting: Internal Medicine

## 2015-04-10 ENCOUNTER — Ambulatory Visit: Payer: Self-pay

## 2015-04-10 ENCOUNTER — Ambulatory Visit (INDEPENDENT_AMBULATORY_CARE_PROVIDER_SITE_OTHER): Payer: Self-pay | Admitting: Internal Medicine

## 2015-04-10 VITALS — BP 139/94 | HR 75 | Temp 97.6°F | Ht 67.0 in | Wt 173.0 lb

## 2015-04-10 DIAGNOSIS — F329 Major depressive disorder, single episode, unspecified: Secondary | ICD-10-CM

## 2015-04-10 DIAGNOSIS — F1010 Alcohol abuse, uncomplicated: Secondary | ICD-10-CM

## 2015-04-10 DIAGNOSIS — I10 Essential (primary) hypertension: Secondary | ICD-10-CM

## 2015-04-10 DIAGNOSIS — F32A Depression, unspecified: Secondary | ICD-10-CM

## 2015-04-10 DIAGNOSIS — B20 Human immunodeficiency virus [HIV] disease: Secondary | ICD-10-CM

## 2015-04-10 NOTE — Progress Notes (Signed)
Patient Active Problem List   Diagnosis Date Noted  . Essential hypertension 07/01/2009    Priority: High  . Human immunodeficiency virus (HIV) disease 10/17/2006    Priority: High  . 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 ANXIETY 10/17/2006  . ABUSE, ALCOHOL, CONTINUOUS 10/17/2006  . DISORDER, TOBACCO USE 10/17/2006  . Depression 10/17/2006    Patient's Medications  New Prescriptions   No medications on file  Previous Medications   DARUNAVIR-COBICISTAT (PREZCOBIX) 800-150 MG PER TABLET    Take 1 tablet by mouth daily. Swallow whole. Do NOT crush, break or chew tablets. Take with food.   HYDROCHLOROTHIAZIDE (HYDRODIURIL) 50 MG TABLET    TAKE 1 TABLET BY MOUTH DAILY   TIVICAY 50 MG TABLET    TAKE 1 TABLET BY MOUTH EVERY DAY   VIREAD 300 MG TABLET    TAKE 1 TABLET BY MOUTH EVERY DAY  Modified Medications   No medications on file  Discontinued Medications   FLUOXETINE (PROZAC) 20 MG CAPSULE    TAKE ONE CAPSULE BY MOUTH DAILY   SULFAMETHOXAZOLE-TRIMETHOPRIM (BACTRIM DS,SEPTRA DS) 800-160 MG PER TABLET    TAKE 1 TABLET BY MOUTH DAILY    Subjective: Darryl Berg is in for his routine HIV follow-up visit. He has had no problems obtaining, tolerating or taking his HIV medications. He takes Viread, Tivicay and Prezcobix every morning along with his blood pressure medication and trimethoprim sulfamethoxazole. He has not missed any doses. He stopped taking his fluoxetine about 4 months ago because he was no longer depressed and did not feel like he needs it. He has stopped drinking excessively and feels much better. He now only drinks alcohol on weekends and never to excess. He states that he feels much better and his wife is much happier with him.  Review of Systems: Constitutional: positive for weight gain Eyes: negative Ears, nose, mouth, throat, and face: negative Respiratory: negative Cardiovascular: negative Gastrointestinal:  negative Genitourinary:negative  Past Medical History  Diagnosis Date  . HIV disease   . Depression   . Substance abuse     Social History  Substance Use Topics  . Smoking status: Never Smoker   . Smokeless tobacco: Never Used  . Alcohol Use: 7.2 oz/week    12 Standard drinks or equivalent per week     Comment: weekends    Family History  Problem Relation Age of Onset  . Hypertension Father     No Known Allergies  Objective:  Filed Vitals:   04/10/15 1618  BP: 139/94  Pulse: 75  Temp: 97.6 F (36.4 C)  TempSrc: Oral  Height:  (1.702 m)  Weight: 173 lb (78.472 kg)   Body mass index is 27.09 kg/(m^2).  General: His weight is up 43 pounds over the last 2 years Oral: No oropharyngeal lesions Skin: No rash Lungs: Clear Cor: Regular S1 and S2 with no murmurs Abdomen: Soft and nontender with no palpable masses Joints and extremities: Normal Neuro: Alert with normal speech and conversation Mood: Normal. He does not appear anxious or depressed  Lab Results Lab Results  Component Value Date   WBC 4.8 03/27/2015   HGB 13.8 03/27/2015   HCT 41.5 03/27/2015   MCV 94.1 03/27/2015   PLT 303 03/27/2015    Lab Results  Component Value Date   CREATININE 1.19 03/27/2015   BUN 14 03/27/2015   NA 137 03/27/2015   K 4.1 03/27/2015  CL 97* 03/27/2015   CO2 26 03/27/2015    Lab Results  Component Value Date   ALT 26 03/27/2015   AST 29 03/27/2015   ALKPHOS 44 03/27/2015   BILITOT 0.4 03/27/2015    Lab Results  Component Value Date   CHOL 170 03/27/2015   HDL 50 03/27/2015   LDLCALC 68 03/27/2015   TRIG 261* 03/27/2015   CHOLHDL 3.4 03/27/2015    Lab Results HIV 1 RNA QUANT (copies/mL)  Date Value  03/27/2015 <20  07/31/2014 <20  01/17/2014 <20   CD4 T CELL ABS (/uL)  Date Value  03/27/2015 300*  07/31/2014 250*  01/17/2014 180*     Problem List Items Addressed This Visit      High   Essential hypertension - Primary    His blood  pressure remained slightly above goal but his adherence with his medication is very good. I encouraged him to get regular exercise and consider some weight loss as his BMI is up to 27.      Human immunodeficiency virus (HIV) disease    His HIV infection has come under excellent control in the past few years since his adherence has improved. He is having slow but steady CD4 reconstitution. I will continue his current anti-retroviral regimen and have him stop prophylactic trimethoprim sulfamethoxazole. He will follow-up after lab work in 6 months.      Relevant Orders   T-helper cell (CD4)- ([facility] clinic only)   HIV 1 RNA quant-no reflex-bld   CBC   Comprehensive metabolic panel   Lipid panel   RPR     Unprioritized   ABUSE, ALCOHOL, CONTINUOUS    He is drinking much less alcohol and as a result he is feeling better and managing his chronic medical conditions much more effectively. I congratulated him on his success      Depression    He is no longer depressed since he stopped drinking alcohol excessively.           Cliffton Asters, MD [facility] for Infectious Disease [facility] [phone] pager   [phone] cell 04/10/2015, 4:59 PM

## 2015-04-10 NOTE — Assessment & Plan Note (Signed)
His blood pressure remained slightly above goal but his adherence with his medication is very good. I encouraged him to get regular exercise and consider some weight loss as his BMI is up to 27.

## 2015-04-10 NOTE — Assessment & Plan Note (Signed)
He is no longer depressed since he stopped drinking alcohol excessively.

## 2015-04-10 NOTE — Assessment & Plan Note (Signed)
He is drinking much less alcohol and as a result he is feeling better and managing his chronic medical conditions much more effectively. I congratulated him on his success

## 2015-04-10 NOTE — Assessment & Plan Note (Signed)
His HIV infection has come under excellent control in the past few years since his adherence has improved. He is having slow but steady CD4 reconstitution. I will continue his current anti-retroviral regimen and have him stop prophylactic trimethoprim sulfamethoxazole. He will follow-up after lab work in 6 months.

## 2015-04-18 ENCOUNTER — Encounter: Payer: Self-pay | Admitting: *Deleted

## 2015-06-12 ENCOUNTER — Other Ambulatory Visit: Payer: Self-pay | Admitting: *Deleted

## 2015-06-12 DIAGNOSIS — I10 Essential (primary) hypertension: Secondary | ICD-10-CM

## 2015-06-12 MED ORDER — HYDROCHLOROTHIAZIDE 50 MG PO TABS: 50.0000 mg | ORAL_TABLET | Freq: Every day | ORAL | Status: DC

## 2015-10-06 ENCOUNTER — Other Ambulatory Visit: Payer: Self-pay | Admitting: Infectious Disease

## 2015-11-06 ENCOUNTER — Other Ambulatory Visit (INDEPENDENT_AMBULATORY_CARE_PROVIDER_SITE_OTHER): Payer: Self-pay

## 2015-11-06 ENCOUNTER — Ambulatory Visit: Payer: Self-pay

## 2015-11-06 DIAGNOSIS — Z79899 Other long term (current) drug therapy: Secondary | ICD-10-CM

## 2015-11-06 DIAGNOSIS — B20 Human immunodeficiency virus [HIV] disease: Secondary | ICD-10-CM

## 2015-11-06 DIAGNOSIS — Z113 Encounter for screening for infections with a predominantly sexual mode of transmission: Secondary | ICD-10-CM

## 2015-11-06 LAB — COMPREHENSIVE METABOLIC PANEL
ALBUMIN: 3.9 g/dL (ref 3.6–5.1)
ALT: 20 U/L (ref 9–46)
AST: 24 U/L (ref 10–40)
Alkaline Phosphatase: 43 U/L (ref 40–115)
BUN: 10 mg/dL (ref 7–25)
CALCIUM: 8.4 mg/dL — AB (ref 8.6–10.3)
CHLORIDE: 100 mmol/L (ref 98–110)
CO2: 24 mmol/L (ref 20–31)
CREATININE: 1.22 mg/dL (ref 0.60–1.35)
Glucose, Bld: 77 mg/dL (ref 65–99)
POTASSIUM: 3.8 mmol/L (ref 3.5–5.3)
SODIUM: 137 mmol/L (ref 135–146)
TOTAL PROTEIN: 7.3 g/dL (ref 6.1–8.1)
Total Bilirubin: 0.3 mg/dL (ref 0.2–1.2)

## 2015-11-06 LAB — LIPID PANEL
CHOLESTEROL: 158 mg/dL (ref 125–200)
HDL: 50 mg/dL (ref >=40)
LDL CALC: 60 mg/dL (ref <130)
TRIGLYCERIDES: 241 mg/dL — AB (ref <150)
Total CHOL/HDL Ratio: 3.2 Ratio (ref <=5.0)
VLDL: 48 mg/dL — AB (ref <30)

## 2015-11-06 LAB — CBC
HEMATOCRIT: 41.7 % (ref 39.0–52.0)
HEMOGLOBIN: 13.6 g/dL (ref 13.0–17.0)
MCH: 30.3 pg (ref 26.0–34.0)
MCHC: 32.6 g/dL (ref 30.0–36.0)
MCV: 92.9 fL (ref 78.0–100.0)
MPV: 9.2 fL (ref 8.6–12.4)
Platelets: 266 10*3/uL (ref 150–400)
RBC: 4.49 MIL/uL (ref 4.22–5.81)
RDW: 14.9 % (ref 11.5–15.5)
WBC: 3.8 10*3/uL — ABNORMAL LOW (ref 4.0–10.5)

## 2015-11-07 LAB — RPR

## 2015-11-07 LAB — T-HELPER CELL (CD4) - (RCID CLINIC ONLY)
CD4 T CELL ABS: 270 /uL — AB (ref 400–2700)
CD4 T CELL HELPER: 15 % — AB (ref 33–55)

## 2015-11-09 LAB — HIV-1 RNA QUANT-NO REFLEX-BLD
HIV 1 RNA QUANT: 241 {copies}/mL — AB (ref <20)
HIV-1 RNA Quant, Log: 2.38 Log copies/mL — ABNORMAL HIGH (ref <1.30)

## 2015-11-27 ENCOUNTER — Ambulatory Visit: Payer: Self-pay | Admitting: Internal Medicine

## 2015-12-15 ENCOUNTER — Ambulatory Visit (INDEPENDENT_AMBULATORY_CARE_PROVIDER_SITE_OTHER): Payer: Self-pay | Admitting: Internal Medicine

## 2015-12-15 ENCOUNTER — Encounter: Payer: Self-pay | Admitting: Internal Medicine

## 2015-12-15 VITALS — BP 175/121 | HR 79 | Temp 98.6°F | Wt 171.0 lb

## 2015-12-15 DIAGNOSIS — F32A Depression, unspecified: Secondary | ICD-10-CM

## 2015-12-15 DIAGNOSIS — B20 Human immunodeficiency virus [HIV] disease: Secondary | ICD-10-CM

## 2015-12-15 DIAGNOSIS — I10 Essential (primary) hypertension: Secondary | ICD-10-CM

## 2015-12-15 DIAGNOSIS — F329 Major depressive disorder, single episode, unspecified: Secondary | ICD-10-CM

## 2015-12-15 DIAGNOSIS — F1010 Alcohol abuse, uncomplicated: Secondary | ICD-10-CM

## 2015-12-15 NOTE — Assessment & Plan Note (Signed)
Patient denies any symptoms of depression and reports his mood has been good.  He is in good spirits today.

## 2015-12-15 NOTE — Assessment & Plan Note (Addendum)
Patient reports adherence to Darryl Berg medications and denies any side-effects/missed doses.  Will repeat VL today as it was slightly elevated (241) on 11/06/15.  Patient will return to clinic in 6 weeks to discuss simplification of ART.  Informed him that Genvoya + Prezista might help reduce Darryl Berg pill burden, but will discuss further at next visit.  Addendum: I have seen and examined Darryl Berg. He admits to drinking more alcohol this past weekend cause it was a "long weekend". He does note that Darryl Berg blood pressure runs higher when he is drinking. It is lower during the week when he does not drink as much. He denies missing doses of Darryl Berg medications but Darryl Berg viral load is up slightly. I will repeat it today and see him back in 6 weeks. I've encouraged him to cut down on Darryl Berg alcohol intake. If Darryl Berg viral load is back down I will consider simplifying Darryl Berg regimen to Genvoya and Prezista.

## 2015-12-15 NOTE — Assessment & Plan Note (Signed)
Blood pressure elevated today (175/121).  Patient denies any missed doses of his BP medications.  Additionally, he reports assessing his blood pressure at work with a range of 120-132/80s.  Recheck prior to leaving clinic 176/114.  [facility] to [facility].

## 2015-12-15 NOTE — Progress Notes (Signed)
   Subjective:    Patient ID: Lorella NimrodJean P Tarpley, male    DOB: 01/11/1976, 40 y.o.   MRN: [id number]  HPI Mr. Ailene ArdsJ.P. Callari is a 40 year old male diagnosed with HIV.  His ART regimen consists of Tivicay, Viread, and Prezcobix.  He denies any difficulties receiving his medications or tolerating them.  He reports taking his medication every morning with breakfast.  He denies any missed doses; however, reported "taking medication late", which he clarified to mean ~30 minutes late.  Patient's blood pressure elevated today (175/121), but denies missing his BP medication.  He reports checking his BP everyday at work.  Normally, his range is 120-132/80s.  He does report an episode of increased ETOH consumption this weekend ("3 small bottles"), which is not typical for him.  Patient seen in clinic today for routine follow-up.  He reports feeling well and is in good spirits.  Lab Results  Component Value Date   HIV1RNAQUANT 241* 11/06/2015   Lab Results  Component Value Date   CD4TABS 270* 11/06/2015   CD4TABS 300* 03/27/2015   CD4TABS 250* 07/31/2014    Review of Systems  Constitutional: Negative for fever and chills.  HENT: Negative for mouth sores.   Eyes: Negative for visual disturbance.  Respiratory: Negative for cough, chest tightness and shortness of breath.   Cardiovascular: Negative for chest pain and leg swelling.  Gastrointestinal: Negative for nausea, abdominal pain, diarrhea and constipation.  Genitourinary: Negative for dysuria.  Skin: Negative for rash.  Neurological: Positive for headaches (Headache at 0600 today.  Took 2 tylenol, which helped.  However, headache again at 1200.). Negative for dizziness and light-headedness.      Objective:   Physical Exam  Constitutional: He is oriented to person, place, and time. He appears well-developed and well-nourished.  He is in good spirits and reports feeling well.  HENT:  Mouth/Throat: No oropharyngeal exudate.  Cardiovascular: Normal  rate, regular rhythm and normal heart sounds.   No murmur heard. Pulmonary/Chest: Effort normal and breath sounds normal. No respiratory distress. He has no wheezes.  Abdominal: Soft. Bowel sounds are normal. There is no tenderness.  Lymphadenopathy:    He has no cervical adenopathy.  Neurological: He is alert and oriented to person, place, and time.  Psychiatric: He has a normal mood and affect.      Assessment & Plan:

## 2015-12-15 NOTE — Assessment & Plan Note (Signed)
Patient reports that he had an increased amount of alcohol this weekend as it was a holiday.  Discussed and encouraged patient to be mindful about the amount of alcohol consumed as it has direct health effects.  Patient verbalized understanding.  Will continue to monitor patient's alcohol intake.

## 2015-12-17 LAB — HIV-1 RNA QUANT-NO REFLEX-BLD
HIV 1 RNA Quant: 162 copies/mL — ABNORMAL HIGH (ref <20)
HIV-1 RNA Quant, Log: 2.21 Log copies/mL — ABNORMAL HIGH (ref <1.30)

## 2016-01-07 ENCOUNTER — Encounter: Payer: Self-pay | Admitting: Internal Medicine

## 2016-03-09 ENCOUNTER — Other Ambulatory Visit: Payer: Self-pay | Admitting: *Deleted

## 2016-03-09 DIAGNOSIS — B20 Human immunodeficiency virus [HIV] disease: Secondary | ICD-10-CM

## 2016-03-09 MED ORDER — TENOFOVIR DISOPROXIL FUMARATE 300 MG PO TABS: 300.0000 mg | ORAL_TABLET | Freq: Every day | ORAL | Status: DC

## 2016-03-09 MED ORDER — DARUNAVIR-COBICISTAT 800-150 MG PO TABS: 1.0000 | ORAL_TABLET | Freq: Every day | ORAL | Status: DC

## 2016-03-09 MED ORDER — DOLUTEGRAVIR SODIUM 50 MG PO TABS
50.0000 mg | ORAL_TABLET | Freq: Every day | ORAL | Status: DC
Start: 2016-03-09 — End: 2017-03-14

## 2016-07-05 ENCOUNTER — Telehealth: Payer: Self-pay | Admitting: *Deleted

## 2016-07-05 DIAGNOSIS — I10 Essential (primary) hypertension: Secondary | ICD-10-CM

## 2016-07-05 MED ORDER — HYDROCHLOROTHIAZIDE 50 MG PO TABS: 50.0000 mg | ORAL_TABLET | Freq: Every day | ORAL | 0 refills | Status: DC

## 2016-07-05 NOTE — Telephone Encounter (Signed)
Needing refill for B/P rx.  Pt has an appointment for RW/renewal.  Pt informed that he will need to pay out-of-pocket for B/P medication until ADAP is approved.  Patient agreed to pay out-of-pocket for the rx an to send if to [facility] [address]  RN reviewed the last OV notes.  [facility] to his blood pressure issues.  This referral was not placed at that time.  Referral placed today.

## 2016-07-08 ENCOUNTER — Ambulatory Visit: Payer: Self-pay

## 2016-07-13 ENCOUNTER — Telehealth: Payer: Self-pay | Admitting: *Deleted

## 2016-07-13 ENCOUNTER — Ambulatory Visit: Payer: Self-pay | Admitting: Internal Medicine

## 2016-07-13 NOTE — Telephone Encounter (Signed)
Pt. Forgot this appt.  Made him a new appt.  Pt agreed to put the appt in his phone with an alarm.

## 2016-07-16 ENCOUNTER — Encounter: Payer: Self-pay | Admitting: Internal Medicine

## 2016-07-19 ENCOUNTER — Encounter: Payer: Self-pay | Admitting: *Deleted

## 2016-07-20 ENCOUNTER — Ambulatory Visit: Payer: Self-pay | Admitting: Internal Medicine

## 2016-07-30 ENCOUNTER — Other Ambulatory Visit: Payer: Self-pay | Admitting: Internal Medicine

## 2016-07-30 DIAGNOSIS — I10 Essential (primary) hypertension: Secondary | ICD-10-CM

## 2016-08-08 ENCOUNTER — Other Ambulatory Visit: Payer: Self-pay | Admitting: Internal Medicine

## 2016-08-08 DIAGNOSIS — I10 Essential (primary) hypertension: Secondary | ICD-10-CM

## 2016-08-17 ENCOUNTER — Ambulatory Visit: Payer: Self-pay | Admitting: Family Medicine

## 2016-08-20 ENCOUNTER — Other Ambulatory Visit: Payer: Self-pay | Admitting: Infectious Disease

## 2016-09-09 ENCOUNTER — Telehealth: Payer: Self-pay | Admitting: Lab

## 2016-09-09 NOTE — Telephone Encounter (Signed)
Patient NO SHOWED for appointment with [facility] on 08/17/16@ 1100.

## 2016-09-14 ENCOUNTER — Ambulatory Visit: Payer: Self-pay

## 2016-09-14 ENCOUNTER — Other Ambulatory Visit: Payer: Self-pay

## 2016-09-25 ENCOUNTER — Encounter (HOSPITAL_COMMUNITY): Payer: Self-pay | Admitting: Emergency Medicine

## 2016-09-25 ENCOUNTER — Observation Stay (HOSPITAL_COMMUNITY)
Admission: EM | Admit: 2016-09-25 | Discharge: 2016-09-26 | Disposition: A | Discharge status: Home / Self Care | Payer: Self-pay | Attending: Internal Medicine | Admitting: Internal Medicine

## 2016-09-25 ENCOUNTER — Emergency Department (HOSPITAL_COMMUNITY): Payer: Self-pay

## 2016-09-25 DIAGNOSIS — R1013 Epigastric pain: Secondary | ICD-10-CM

## 2016-09-25 DIAGNOSIS — R110 Nausea: Secondary | ICD-10-CM

## 2016-09-25 DIAGNOSIS — Z23 Encounter for immunization: Secondary | ICD-10-CM | POA: Insufficient documentation

## 2016-09-25 DIAGNOSIS — I10 Essential (primary) hypertension: Secondary | ICD-10-CM | POA: Insufficient documentation

## 2016-09-25 DIAGNOSIS — Z8611 Personal history of tuberculosis: Secondary | ICD-10-CM | POA: Insufficient documentation

## 2016-09-25 DIAGNOSIS — F1010 Alcohol abuse, uncomplicated: Secondary | ICD-10-CM | POA: Insufficient documentation

## 2016-09-25 DIAGNOSIS — K2920 Alcoholic gastritis without bleeding: Principal | ICD-10-CM | POA: Diagnosis present

## 2016-09-25 DIAGNOSIS — R7401 Elevation of levels of liver transaminase levels: Secondary | ICD-10-CM | POA: Diagnosis present

## 2016-09-25 DIAGNOSIS — K8020 Calculus of gallbladder without cholecystitis without obstruction: Secondary | ICD-10-CM | POA: Diagnosis present

## 2016-09-25 DIAGNOSIS — B20 Human immunodeficiency virus [HIV] disease: Secondary | ICD-10-CM | POA: Diagnosis present

## 2016-09-25 DIAGNOSIS — Z8249 Family history of ischemic heart disease and other diseases of the circulatory system: Secondary | ICD-10-CM

## 2016-09-25 DIAGNOSIS — Z21 Asymptomatic human immunodeficiency virus [HIV] infection status: Secondary | ICD-10-CM

## 2016-09-25 DIAGNOSIS — Z79899 Other long term (current) drug therapy: Secondary | ICD-10-CM | POA: Insufficient documentation

## 2016-09-25 DIAGNOSIS — F1020 Alcohol dependence, uncomplicated: Secondary | ICD-10-CM

## 2016-09-25 DIAGNOSIS — R740 Nonspecific elevation of levels of transaminase and lactic acid dehydrogenase [LDH]: Secondary | ICD-10-CM | POA: Insufficient documentation

## 2016-09-25 DIAGNOSIS — K7010 Alcoholic hepatitis without ascites: Secondary | ICD-10-CM | POA: Diagnosis present

## 2016-09-25 LAB — LIPASE, BLOOD: LIPASE: 36 U/L (ref 11–51)

## 2016-09-25 LAB — PROTIME-INR
INR: 1
PROTHROMBIN TIME: 13.2 s (ref 11.4–15.2)

## 2016-09-25 LAB — COMPREHENSIVE METABOLIC PANEL
ALT: 201 U/L — ABNORMAL HIGH (ref 17–63)
ANION GAP: 12 (ref 5–15)
AST: 604 U/L — ABNORMAL HIGH (ref 15–41)
Albumin: 3.9 g/dL (ref 3.5–5.0)
Alkaline Phosphatase: 89 U/L (ref 38–126)
BUN: 10 mg/dL (ref 6–20)
CHLORIDE: 100 mmol/L — AB (ref 101–111)
CO2: 26 mmol/L (ref 22–32)
Calcium: 9.2 mg/dL (ref 8.9–10.3)
Creatinine, Ser: 1.1 mg/dL (ref 0.61–1.24)
Glucose, Bld: 151 mg/dL — ABNORMAL HIGH (ref 65–99)
POTASSIUM: 3.9 mmol/L (ref 3.5–5.1)
SODIUM: 138 mmol/L (ref 135–145)
Total Bilirubin: 1.4 mg/dL — ABNORMAL HIGH (ref 0.3–1.2)
Total Protein: 8.3 g/dL — ABNORMAL HIGH (ref 6.5–8.1)

## 2016-09-25 LAB — CBC
HCT: 43.3 % (ref 39.0–52.0)
Hemoglobin: 14.7 g/dL (ref 13.0–17.0)
MCH: 31 pg (ref 26.0–34.0)
MCHC: 33.9 g/dL (ref 30.0–36.0)
MCV: 91.4 fL (ref 78.0–100.0)
PLATELETS: 223 10*3/uL (ref 150–400)
RBC: 4.74 MIL/uL (ref 4.22–5.81)
RDW: 14.3 % (ref 11.5–15.5)
WBC: 6.1 10*3/uL (ref 4.0–10.5)

## 2016-09-25 LAB — URINALYSIS, ROUTINE W REFLEX MICROSCOPIC
Bilirubin Urine: NEGATIVE
GLUCOSE, UA: NEGATIVE mg/dL
HGB URINE DIPSTICK: NEGATIVE
Ketones, ur: NEGATIVE mg/dL
LEUKOCYTES UA: NEGATIVE
Nitrite: NEGATIVE
PROTEIN: NEGATIVE mg/dL
SPECIFIC GRAVITY, URINE: 1.019 (ref 1.005–1.030)
pH: 7 (ref 5.0–8.0)

## 2016-09-25 LAB — BILIRUBIN, DIRECT: Bilirubin, Direct: 0.1 mg/dL (ref 0.1–0.5)

## 2016-09-25 LAB — ACETAMINOPHEN LEVEL

## 2016-09-25 LAB — ETHANOL: Alcohol, Ethyl (B): 24 mg/dL — ABNORMAL HIGH (ref <5)

## 2016-09-25 MED ORDER — GI COCKTAIL ~~LOC~~
30.0000 mL | Freq: Two times a day (BID) | ORAL | Status: DC | PRN
  Administered 2016-09-25: 30 mL via ORAL
  Filled 2016-09-25: qty 30

## 2016-09-25 MED ORDER — PANTOPRAZOLE SODIUM 40 MG PO TBEC: 40.0000 mg | DELAYED_RELEASE_TABLET | Freq: Two times a day (BID) | ORAL | Status: DC

## 2016-09-25 MED ORDER — ACETAMINOPHEN 650 MG RE SUPP: 650.0000 mg | Freq: Four times a day (QID) | RECTAL | Status: DC | PRN

## 2016-09-25 MED ORDER — THIAMINE HCL 100 MG/ML IJ SOLN: 100.0000 mg | Freq: Every day | INTRAMUSCULAR | Status: DC

## 2016-09-25 MED ORDER — GI COCKTAIL ~~LOC~~
30.0000 mL | Freq: Once | ORAL | Status: AC
  Administered 2016-09-25: 30 mL via ORAL
  Filled 2016-09-25: qty 30

## 2016-09-25 MED ORDER — LORAZEPAM 1 MG PO TABS: 1.0000 mg | ORAL_TABLET | Freq: Four times a day (QID) | ORAL | Status: DC | PRN

## 2016-09-25 MED ORDER — FAMOTIDINE IN NACL 20-0.9 MG/50ML-% IV SOLN
20.0000 mg | INTRAVENOUS | Status: AC
  Administered 2016-09-25: 20 mg via INTRAVENOUS
  Filled 2016-09-25: qty 50

## 2016-09-25 MED ORDER — PNEUMOCOCCAL VAC POLYVALENT 25 MCG/0.5ML IJ INJ
0.5000 mL | INJECTION | INTRAMUSCULAR | Status: AC
  Administered 2016-09-26: 0.5 mL via INTRAMUSCULAR
  Filled 2016-09-25: qty 0.5

## 2016-09-25 MED ORDER — ACETAMINOPHEN 325 MG PO TABS: 650.0000 mg | ORAL_TABLET | Freq: Four times a day (QID) | ORAL | Status: DC | PRN

## 2016-09-25 MED ORDER — SENNOSIDES-DOCUSATE SODIUM 8.6-50 MG PO TABS
1.0000 | ORAL_TABLET | Freq: Every day | ORAL | Status: DC
  Administered 2016-09-25: 1 via ORAL
  Filled 2016-09-25: qty 1

## 2016-09-25 MED ORDER — LORAZEPAM 1 MG PO TABS: 0.0000 mg | ORAL_TABLET | Freq: Two times a day (BID) | ORAL | Status: DC

## 2016-09-25 MED ORDER — DOLUTEGRAVIR SODIUM 50 MG PO TABS
50.0000 mg | ORAL_TABLET | Freq: Every day | ORAL | Status: DC
  Administered 2016-09-25 – 2016-09-26 (×2): 50 mg via ORAL
  Filled 2016-09-25 (×2): qty 1

## 2016-09-25 MED ORDER — TENOFOVIR DISOPROXIL FUMARATE 300 MG PO TABS
300.0000 mg | ORAL_TABLET | Freq: Every day | ORAL | Status: DC
  Administered 2016-09-25 – 2016-09-26 (×2): 300 mg via ORAL
  Filled 2016-09-25 (×2): qty 1

## 2016-09-25 MED ORDER — LORAZEPAM 1 MG PO TABS: 0.0000 mg | ORAL_TABLET | Freq: Four times a day (QID) | ORAL | Status: DC

## 2016-09-25 MED ORDER — SODIUM CHLORIDE 0.9% FLUSH
3.0000 mL | Freq: Two times a day (BID) | INTRAVENOUS | Status: DC
  Administered 2016-09-25 – 2016-09-26 (×3): 3 mL via INTRAVENOUS

## 2016-09-25 MED ORDER — SUCRALFATE 1 G PO TABS
1.0000 g | ORAL_TABLET | Freq: Once | ORAL | Status: AC
  Administered 2016-09-25: 1 g via ORAL
  Filled 2016-09-25: qty 1

## 2016-09-25 MED ORDER — ADULT MULTIVITAMIN W/MINERALS CH
1.0000 | ORAL_TABLET | Freq: Every day | ORAL | Status: DC
  Administered 2016-09-25 – 2016-09-26 (×2): 1 via ORAL
  Filled 2016-09-25 (×2): qty 1

## 2016-09-25 MED ORDER — FOLIC ACID 1 MG PO TABS
1.0000 mg | ORAL_TABLET | Freq: Every day | ORAL | Status: DC
  Administered 2016-09-25 – 2016-09-26 (×2): 1 mg via ORAL
  Filled 2016-09-25 (×2): qty 1

## 2016-09-25 MED ORDER — LORAZEPAM 2 MG/ML IJ SOLN: 1.0000 mg | Freq: Four times a day (QID) | INTRAMUSCULAR | Status: DC | PRN

## 2016-09-25 MED ORDER — VITAMIN B-1 100 MG PO TABS
100.0000 mg | ORAL_TABLET | Freq: Every day | ORAL | Status: DC
  Administered 2016-09-25 – 2016-09-26 (×2): 100 mg via ORAL
  Filled 2016-09-25 (×2): qty 1

## 2016-09-25 MED ORDER — PANTOPRAZOLE SODIUM 40 MG PO TBEC
40.0000 mg | DELAYED_RELEASE_TABLET | Freq: Two times a day (BID) | ORAL | Status: DC
  Administered 2016-09-25 – 2016-09-26 (×2): 40 mg via ORAL
  Filled 2016-09-25 (×2): qty 1

## 2016-09-25 MED ORDER — DARUNAVIR-COBICISTAT 800-150 MG PO TABS
1.0000 | ORAL_TABLET | Freq: Every day | ORAL | Status: DC
  Administered 2016-09-25 – 2016-09-26 (×2): 1 via ORAL
  Filled 2016-09-25 (×2): qty 1

## 2016-09-25 MED ORDER — HEPARIN SODIUM (PORCINE) 5000 UNIT/ML IJ SOLN
5000.0000 [IU] | Freq: Three times a day (TID) | INTRAMUSCULAR | Status: DC
  Administered 2016-09-25 – 2016-09-26 (×3): 5000 [IU] via SUBCUTANEOUS
  Filled 2016-09-25 (×3): qty 1

## 2016-09-25 MED ORDER — SODIUM CHLORIDE 0.9 % IV SOLN
INTRAVENOUS | Status: AC
  Administered 2016-09-25: 1000 mL via INTRAVENOUS

## 2016-09-25 MED ORDER — SODIUM CHLORIDE 0.9 % IV BOLUS (SEPSIS)
500.0000 mL | Freq: Once | INTRAVENOUS | Status: AC
  Administered 2016-09-25: 500 mL via INTRAVENOUS

## 2016-09-25 MED ORDER — ONDANSETRON HCL 4 MG PO TABS: 4.0000 mg | ORAL_TABLET | Freq: Three times a day (TID) | ORAL | Status: DC | PRN

## 2016-09-25 NOTE — ED Provider Notes (Signed)
[facility]-EMERGENCY DEPT Provider Note   CSN: [id number] Arrival date & time: 09/25/16  [id number]     History   Chief Complaint Chief Complaint  Patient presents with  . Abdominal Pain    HPI Darryl Berg is a 41 y.o. male  With a pmh of HIV and WTOH abuse who presents with c/o epigastric abdominal pain onset Friday around 3 PM.  Patient states that he ate spaghetti and garlic bread for lunch and about 1 hour later began to have severe epigastric and BL rib pain. He has had associated nausea and dark urine. He denies vomiting, constipation or diarrhea. He took 1g Tylenol At the onset of his symptoms and also took a gram of Tylenol this morning due to continued epigastric pain. He's had a previous episode of symptoms like this about 6 months ago and was told at an urgent care that he "had abdominal bleeding." He denies dark tarry stools. The patient states he is not drinking daily but last weekend, did binge drinking over the weekend. He denies NSAID use. The patient's fluid was home prepared and he has no contacts with similar symptoms. Notably, the patient has markedly elevated liver enzymes and does not appear to have a previous elevation. Review of the MR shows no previous hepatitis panel. He Denies fevers or chills. Patient states that he is compliant with his HIV medications. HPI  Past Medical History:  Diagnosis Date  . Depression   . HIV disease (HCC)   . Substance abuse     Patient Active Problem List   Diagnosis Date Noted  . Transaminitis 09/25/2016  . Essential hypertension 07/01/2009  . 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 Human immunodeficiency virus (HIV) disease (HCC) 10/17/2006  . ANXIETY 10/17/2006  . ABUSE, ALCOHOL, CONTINUOUS 10/17/2006  . DISORDER, TOBACCO USE 10/17/2006  . Depression 10/17/2006    Past Surgical History:  Procedure Laterality Date  . NASAL HEMORRHAGE CONTROL  11/17/2011   Procedure: EPISTAXIS  CONTROL;  Surgeon: Darletta MollSui W Teoh, MD;  [facility];  Service: ENT;  Laterality: Bilateral;       Home Medications    Prior to Admission medications   Medication Sig Start Date End Date Taking? Authorizing Provider  acetaminophen (TYLENOL) 325 MG tablet Take 650 mg by mouth every 6 (six) hours as needed for mild pain or fever.   Yes Historical Provider, MD  darunavir-cobicistat (PREZCOBIX) 800-150 MG tablet Take 1 tablet by mouth daily. Swallow whole. Do NOT crush, break or chew tablets. Take with food. 03/09/16  Yes [name], MD  dolutegravir (TIVICAY) 50 MG tablet Take 1 tablet (50 mg total) by mouth daily. 03/09/16  Yes [name], MD  hydrochlorothiazide (HYDRODIURIL) 50 MG tablet TAKE 1 TABLET(50 MG) BY MOUTH DAILY 07/30/16  Yes [name], MD  tenofovir (VIREAD) 300 MG tablet Take 1 tablet (300 mg total) by mouth daily. 03/09/16  Yes [name], MD  hydrochlorothiazide (HYDRODIURIL) 50 MG tablet TAKE 1 TABLET (50 MG TOTAL) BY MOUTH DAILY. Patient not taking: Reported on 09/25/2016 08/09/16   [name], MD    Family History Family History  Problem Relation Age of Onset  . Hypertension Father     Social History Social History  Substance Use Topics  . Smoking status: Never Smoker  . Smokeless tobacco: Never Used  . Alcohol use 7.2 oz/week    12 Standard drinks or equivalent per week     Comment: weekends  Allergies   Patient has no known allergies.   Review of Systems Review of Systems  Ten systems reviewed and are negative for acute change, except as noted in the HPI.   Physical Exam Updated Vital Signs BP 132/91   Pulse 98   Temp 98.5 F (36.9 C) (Oral)   Resp 18   Ht 5\' 6"  (1.676 m)   Wt 77.1 kg   SpO2 100%   BMI 27.44 kg/m   Physical Exam  Constitutional: He appears well-developed and well-nourished. No distress.  HENT:  Head: Normocephalic and atraumatic.  Eyes: Conjunctivae and EOM are normal. Pupils are equal, round, and reactive  to light. No scleral icterus.  Neck: Normal range of motion. Neck supple.  No icterus  Cardiovascular: Normal rate, regular rhythm and normal heart sounds.   Pulmonary/Chest: Effort normal and breath sounds normal. No respiratory distress.  Abdominal: Soft. He exhibits no distension. There is no tenderness.  Diffuse tenderness  Musculoskeletal: He exhibits no edema.  Neurological: He is alert.  Skin: Skin is warm and dry. He is not diaphoretic.  Psychiatric: His behavior is normal.  Nursing note and vitals reviewed.    ED Treatments / Results  Labs (all labs ordered are listed, but only abnormal results are displayed) Labs Reviewed  COMPREHENSIVE METABOLIC PANEL - Abnormal; Notable for the following:       Result Value   Chloride 100 (*)    Glucose, Bld 151 (*)    Total Protein 8.3 (*)    AST 604 (*)    ALT 201 (*)    Total Bilirubin 1.4 (*)    All other components within normal limits  ACETAMINOPHEN LEVEL - Abnormal; Notable for the following:    Acetaminophen (Tylenol), Serum <10 (*)    All other components within normal limits  ETHANOL - Abnormal; Notable for the following:    Alcohol, Ethyl (B) 24 (*)    All other components within normal limits  LIPASE, BLOOD  CBC  URINALYSIS, ROUTINE W REFLEX MICROSCOPIC  PROTIME-INR  HEPATITIS PANEL, ACUTE    EKG  EKG Interpretation None       Radiology US Abdomen Complete  Result Date: 09/25/2016 CLINICAL DATA:  Generalized abdominal pain, transaminitis EXAM: ABDOMEN ULTRASOUND COMPLETE COMPARISON:  None. FINDINGS: Gallbladder: 7 mm gallstone within the gallbladder. No wall thickening or sonographic Murphy sign. Trace pericholecystic fluid. Common bile duct: Diameter: Upper limits normal caliber, 7 mm Liver: No focal lesion identified. Within normal limits in parenchymal echogenicity. IVC: No abnormality visualized. Pancreas: Visualized portion unremarkable. Spleen: Size and appearance within normal limits. Right Kidney:  Length: 11.7 cm. Echogenicity within normal limits. No mass or hydronephrosis visualized. Left Kidney: Length: 12.0 cm. Echogenicity within normal limits. No mass or hydronephrosis visualized. Abdominal aorta: No aneurysm visualized. Other findings: None. IMPRESSION: Cholelithiasis. Trace pericholecystic fluid without sonographic Murphy sign. Common bile duct borderline diameter at 7 mm. Electronically Signed   By: Charlett Nose M.D.   On: 09/25/2016 09:00    Procedures Procedures (including critical care time)  Medications Ordered in ED Medications  sucralfate (CARAFATE) tablet 1 g (not administered)  gi cocktail (Maalox,Lidocaine,Donnatal) (not administered)  sodium chloride 0.9 % bolus 500 mL (not administered)  famotidine (PEPCID) IVPB 20 mg premix (not administered)     Initial Impression / Assessment and Plan / ED Course  I have reviewed the triage vital signs and the nursing notes.  Pertinent labs & imaging results that were available during my care of the patient were  reviewed by me and considered in my medical decision making (see chart for details).     Patient with markedly elevated liver enzymes. He has some ETOH in his bloodstream and is clearly not telling the truth about his last consumption time. Given the elevation of enzymes I feel he will need admission to trend them. Acute hepatitis panel pending.  He does not appear to have an elevated tylenol level. Patient is stabe for admission.  Final Clinical Impressions(s) / ED Diagnoses   Final diagnoses:  Transaminitis  Epigastric pain  Nausea    New Prescriptions New Prescriptions   No medications on file     Arthor Captain, PA-C 09/27/16 2306    Melene Plan, DO 09/28/16 [phone]

## 2016-09-25 NOTE — H&P (Signed)
Date: 09/25/2016               Patient Name:  Darryl Berg MRN: [id number]  DOB: 09/15/1975 Age / Sex: 41 y.o., male   PCP: [name], MD         Medical Service: Internal Medicine Teaching Service         Attending Physician: Dr. Carlynn PurlErik Hoffman    First Contact: Dr. Althia FortsAdam Armel Rabbani Pager: [phone]  Second Contact: Dr. Deneise LeverParth Saraiya Pager: [phone]       After Hours (After 5p/  First Contact Pager: [phone]  weekends / holidays): Second Contact Pager: [phone]   Chief Complaint: Abdominal pain  History of Present Illness: Darryl Berg is a 41 y.o. gentleman with PMH HIV, depression, alcohol abuse, TB in 2008 who presents for abdominal pain. He reports he woke from sleep at 3AM yesterday with severe generalized abdominal pain, took two Tylenol and went back to sleep. He felt better on waking, ate breakfast and went to work but his pain returned shortly after lunch and has persisted since then. He decided to come to the ER overnight. He reports the pain and generalized, sharp, non-radiating, constant. Improves with Tylenol, maybe worsened by food. This pain coincided with nausea and loss of appetite yesterday but this has resolved by today. He denies vomiting, constipation, diarrhea, urinary symptoms, fevers, chills, dyspnea, chest pain. He report drinking "a little" alcohol yesterday, about one glass of whiskey. He states that he mainly drinks on the weekend nights and will have 5-6 drinks of liquor. He denies taking NSAID or BC powder. He states that he has been regular with his ART and has an upcoming appointment with Dr. Orvan Falconerampbell.  In the ED vitals HDS, afebile. Abdominal US revealed cholelithiasis, trace pericholecystic fluid without Murphy sign, borderline common bile duct dilatation at 7mm. Labs remarkable for CMP with AST 604, ALT 201, bili 1.4, and ethanol at 24. CBC, lipase, UA, acetaminophen level, and PT/INR normal. He received GI cocktail, Pepcid, Carafate, and IV fluids with  symptom relief. IMTS contacted for admission.  Meds:  Current Meds  Medication Sig  . acetaminophen (TYLENOL) 325 MG tablet Take 650 mg by mouth every 6 (six) hours as needed for mild pain or fever.  . darunavir-cobicistat (PREZCOBIX) 800-150 MG tablet Take 1 tablet by mouth daily. Swallow whole. Do NOT crush, break or chew tablets. Take with food.  . dolutegravir (TIVICAY) 50 MG tablet Take 1 tablet (50 mg total) by mouth daily.  . hydrochlorothiazide (HYDRODIURIL) 50 MG tablet TAKE 1 TABLET(50 MG) BY MOUTH DAILY  . tenofovir (VIREAD) 300 MG tablet Take 1 tablet (300 mg total) by mouth daily.    Allergies: Allergies as of 09/25/2016  . (No Known Allergies)   Past Medical History:  Diagnosis Date  . Depression   . HIV disease (HCC)   . Substance abuse     Family History:  Family History  Problem Relation Age of Onset  . Hypertension Father     Social History:  Social History   Social History  . Marital status: Single    Spouse name: N/A  . Number of children: N/A  . Years of education: N/A   Occupational History  . Not on file.   Social History Main Topics  . Smoking status: Never Smoker  . Smokeless tobacco: Never Used  . Alcohol use 7.2 oz/week    12 Standard drinks or equivalent per week     Comment: weekends  .  Drug use: No  . Sexual activity: Yes     Comment: accepted condoms   Other Topics Concern  . Not on file   Social History Narrative  . No narrative on file    Review of Systems: A complete ROS was negative except as per HPI.   Physical Exam: Blood pressure 135/85, pulse 80, temperature 98.5 F (36.9 C), temperature source Oral, resp. rate 18, height 5\' 6"  (1.676 m), weight 170 lb (77.1 kg), SpO2 99 %.  General appearance: Well-developed gentleman watching TV and resting comfortably in bed, in no distress HENT: Normocephalic, atraumatic, moist mucous membranes, neck supple Eyes: PERRL, EOM inact, non-icteric Cardiovascular: Regular rate  and rhythm, no murmurs, rubs, gallops Respiratory: Clear to auscultation bilaterally, normal work of breathing Abdomen: BS+, soft, mildly tender to deep palpation in all areas except epigastrum, non-distended Extremities: Normal bulk and range of motion, no edema, 2+ peripheral pulses Skin: Warm, dry, intact Neuro: Alert and oriented, cranial nerves grossly intact Psych: Appropriate affect, clear speech, thoughts linear and goal-directed  Assessment & Plan by Problem: Active Problems:   Transaminitis  Abdominal pain with transaminitis, patient reports relatively extensive drinking habits, likely alcoholic hepatitis with component of gastritis, significant pain relief with pepcid, carafate, and GI cocktail. Ultrasound revealed gallstone and borderline CBD dilation but ALP was normal. If alcoholic hepatitis - discriminant function score 7 and no steroid indicated.  - Bowel rest and thin liquid diet - IV fluids at 75 cc/hr - Trend CMP, CBC - Follow hepatitis panel - Protonix 40 mg BID AC - Tylenol PRN for pain - Zofran PRN for nausea  HIV, followed by Dr. Orvan Falconer, reports compliance with ART. On Tivicay, Prezcobix, and Viread. Last CD4 270 in 10/2015, quant 241. - Resume home ART medications - Check CD4 and HIV 1 RNA quant  Alcohol abuse, likely understating his alcohol intake, reports last drink was small glass of liquor yesterday but ethanol level remains elevated this morning at 24.  - CIWA protocol assessment and PRN Ativan - Thiamine and folate  HTN - continue home HCTZ  FEN/GI: Regular diet, replete electrolytes as needed  DVT ppx: Heparin TID  Code status: Full code  Dispo: Admit patient to Observation with expected length of stay less than 2 midnights.  Signed: Althia Forts, MD 09/25/2016, 9:40 AM  Pager: [phone]

## 2016-09-25 NOTE — ED Notes (Signed)
DELCINE RN returned call.  Report given.

## 2016-09-25 NOTE — ED Notes (Signed)
RN on 5W unable to take report at this time.

## 2016-09-25 NOTE — Progress Notes (Signed)
Darryl Berg [id number] Admitted to 4U 985W 02: 09/25/2016 11:24 AM Attending Provider: Gust RungErik C Hoffman, DO    Darryl Berg is a 41 y.o. male patient admitted from ED awake, alert  & orientated  X 3,  No Order, VSS - Blood pressure (!) 144/86, pulse 73, temperature 99.1 F (37.3 C), temperature source Oral, resp. rate 18, height 5\' 6"  (1.676 m), weight 77.1 kg (170 lb), SpO2 100 %., R/A, no c/o shortness of breath, no c/o chest pain, no distress noted. Tele # [id number] placed and pt is currently running:normal sinus rhythm.   IV site WDL:  hand right, condition patent and no redness with a transparent dsg that's clean dry and intact.  Allergies:  No Known Allergies   Past Medical History:  Diagnosis Date  . Depression   . HIV disease (HCC)   . Substance abuse     History:  obtained from the patient.  Pt orientation to unit, room and routine. Information packet given to patient/family and safety video watched.  Admission INP armband ID verified with patient/family, and in place. SR up x 2, fall risk assessment complete with Patient and family verbalizing understanding of risks associated with falls. Pt verbalizes an understanding of how to use the call bell and to call for help before getting out of bed.  Skin, clean-dry- intact without evidence of bruising, or skin tears.   No evidence of skin break down noted on exam.   Paged admitting MD, return call from Dr. Laural BenesJohnson who states will be up to see pt. Will cont to monitor and assist as needed.  Joana ReamerJohnson, Gearldine Looney C, RN 09/25/2016 11:24 AM

## 2016-09-25 NOTE — ED Triage Notes (Signed)
Pt reports that his "stomach is bleeding on the inside", states this has happened before and that he is experiencing sharp abdominal pain and chills.

## 2016-09-26 DIAGNOSIS — R1013 Epigastric pain: Secondary | ICD-10-CM

## 2016-09-26 DIAGNOSIS — K2920 Alcoholic gastritis without bleeding: Secondary | ICD-10-CM | POA: Diagnosis present

## 2016-09-26 DIAGNOSIS — K7010 Alcoholic hepatitis without ascites: Secondary | ICD-10-CM | POA: Diagnosis present

## 2016-09-26 DIAGNOSIS — K8020 Calculus of gallbladder without cholecystitis without obstruction: Secondary | ICD-10-CM | POA: Diagnosis present

## 2016-09-26 LAB — COMPREHENSIVE METABOLIC PANEL
ALBUMIN: 3.5 g/dL (ref 3.5–5.0)
ALT: 134 U/L — ABNORMAL HIGH (ref 17–63)
ANION GAP: 6 (ref 5–15)
AST: 94 U/L — AB (ref 15–41)
Alkaline Phosphatase: 79 U/L (ref 38–126)
BILIRUBIN TOTAL: 0.7 mg/dL (ref 0.3–1.2)
BUN: 7 mg/dL (ref 6–20)
CHLORIDE: 105 mmol/L (ref 101–111)
CO2: 27 mmol/L (ref 22–32)
Calcium: 8.6 mg/dL — ABNORMAL LOW (ref 8.9–10.3)
Creatinine, Ser: 1.32 mg/dL — ABNORMAL HIGH (ref 0.61–1.24)
GFR calc Af Amer: >60 mL/min (ref >60)
Glucose, Bld: 85 mg/dL (ref 65–99)
POTASSIUM: 3.4 mmol/L — AB (ref 3.5–5.1)
Sodium: 138 mmol/L (ref 135–145)
TOTAL PROTEIN: 6.9 g/dL (ref 6.5–8.1)

## 2016-09-26 LAB — GLUCOSE, CAPILLARY: Glucose-Capillary: 87 mg/dL (ref 65–99)

## 2016-09-26 LAB — HEPATITIS PANEL, ACUTE
HCV Ab: <0.1 s/co ratio (ref 0.0–0.9)
Hep A IgM: NEGATIVE
Hep B C IgM: NEGATIVE
Hepatitis B Surface Ag: NEGATIVE

## 2016-09-26 LAB — CBC
HEMATOCRIT: 41.4 % (ref 39.0–52.0)
HEMOGLOBIN: 13.7 g/dL (ref 13.0–17.0)
MCH: 30.2 pg (ref 26.0–34.0)
MCHC: 33.1 g/dL (ref 30.0–36.0)
MCV: 91.4 fL (ref 78.0–100.0)
Platelets: 221 10*3/uL (ref 150–400)
RBC: 4.53 MIL/uL (ref 4.22–5.81)
RDW: 14.6 % (ref 11.5–15.5)
WBC: 4.5 10*3/uL (ref 4.0–10.5)

## 2016-09-26 MED ORDER — POTASSIUM CHLORIDE CRYS ER 20 MEQ PO TBCR
20.0000 meq | EXTENDED_RELEASE_TABLET | Freq: Once | ORAL | Status: AC
  Administered 2016-09-26: 20 meq via ORAL
  Filled 2016-09-26: qty 1

## 2016-09-26 MED ORDER — OMEPRAZOLE 20 MG PO CPDR: 20.0000 mg | DELAYED_RELEASE_CAPSULE | Freq: Every day | ORAL | 0 refills | Status: DC

## 2016-09-26 MED ORDER — PANTOPRAZOLE SODIUM 40 MG PO TBEC: 40.0000 mg | DELAYED_RELEASE_TABLET | Freq: Every day | ORAL | Status: DC

## 2016-09-26 NOTE — Progress Notes (Signed)
Subjective: Darryl Berg is feeling much better today, no pain or nausea. He reports feeling hungry and had no issues with liquid diet overnight. Discussed our impression that his liver injury and abdominal pain is likely secondary to his alcohol intake, plus likely some reflux. He reports that he often has reflux symptoms and has noticed them after spicy foods and drinking. He reports that he is interested in cutting back on his drinking and denies having "the shakes" recently when he stops drinking during the work week. LFTs improving today, asymptomatic, and tolerating diet.   Objective: Vital signs in last 24 hours: Vitals:   09/25/16 1800 09/25/16 2057 09/25/16 2355 09/26/16 0623  BP: (!) 136/93 (!) 147/92 135/87 130/89  Pulse: 73 84 71 67  Resp:  18 18 18   Temp:  98.1 F (36.7 C) 99.1 F (37.3 C) 98 F (36.7 C)  TempSrc:  Oral Oral Oral  SpO2:  99% 100% 100%  Weight:      Height:        Intake/Output Summary (Last 24 hours) at 09/26/16 0953 Last data filed at 09/26/16 0555  Gross per 24 hour  Intake            557.5 ml  Output                0 ml  Net            557.5 ml    Physical Exam General appearance: Well-developed gentleman resting comfortably in bed, in no distress HENT: Normocephalic, atraumatic, moist mucous membranes Cardiovascular: Regular rate and rhythm, no murmurs, rubs, gallops Respiratory: Clear to auscultation bilaterally, normal work of breathing Abdomen: BS+, soft, non-tender, non-distended Extremities: Normal bulk and range of motion, no edema, 2+ peripheral pulses Skin: Warm, dry, intact Neuro: Alert and oriented, cranial nerves grossly intact Psych: Normal affect  Labs / Imaging / Procedures: CBC Latest Ref Rng & Units 09/26/2016 09/25/2016 11/06/2015  WBC 4.0 - 10.5 K/uL 4.5 6.1 3.8(L)  Hemoglobin 13.0 - 17.0 g/dL 16.113.7 09.614.7 04.513.6  Hematocrit 39.0 - 52.0 % 41.4 43.3 41.7  Platelets 150 - 400 K/uL 221 223 266   BMP Latest Ref Rng & Units  09/26/2016 09/25/2016 11/06/2015  Glucose 65 - 99 mg/dL 85 409(W151(H) 77  BUN 6 - 20 mg/dL 7 10 10   Creatinine 0.61 - 1.24 mg/dL 1.19(J1.32(H) 4.781.10 2.951.22  Sodium 135 - 145 mmol/L 138 138 137  Potassium 3.5 - 5.1 mmol/L 3.4(L) 3.9 3.8  Chloride 101 - 111 mmol/L 105 100(L) 100  CO2 22 - 32 mmol/L 27 26 24   Calcium 8.9 - 10.3 mg/dL 6.2(Z8.6(L) 9.2 3.0(Q8.4(L)   No results found.  Assessment/Plan:  Abdominal pain with transaminitis, resolving, patient reports relatively extensive drinking habits, likely alcoholic hepatitis with component of gastritis, significant pain relief with pepcid, carafate, and GI cocktail. Ultrasound revealed gallstone and borderline CBD dilation but ALP was normal. If alcoholic hepatitis - discriminant function score 7 and no steroid indicated.  - Advance to full diet - Follow hepatitis panel - negative, may benefit from hepatitis B vaccine in future - Protonix 40 mg QD for a few weeks - Tylenol PRN for pain - Zofran PRN for nausea  HIV, followed by Dr. Orvan Falconerampbell, reports compliance with ART. On Tivicay, Prezcobix, and Viread. Last CD4 270 in 10/2015, quant 241. - Resume home ART medications - Check CD4 and HIV 1 RNA quant - pending - To follow up with Dr. Orvan Falconerampbell in a few days  Alcohol  abuse, likely understating his alcohol intake, reports last drink was small glass of liquor yesterday but ethanol level remains elevated on admission - CIWA protocol assessment and PRN Ativan, scores remain 0 - Thiamine and folate - Patient endorses desire to cut back on drinking, encouraged full cessation  HTN - continue home HCTZ  FEN/GI: Regular diet, replete electrolytes as needed  Dispo: Anticipated discharge today.   LOS: 0 days   Althia Forts, MD 09/26/2016, 9:53 AM Pager: [phone]

## 2016-09-26 NOTE — Discharge Summary (Signed)
Name: Darryl Berg MRN: [id number] DOB: 02/24/1976 41 y.o. PCP: [name], MD  Date of Admission: 09/25/2016  5:47 AM Date of Discharge: 09/26/2016 Attending Physician: Gust RungErik C Hoffman, DO  Discharge Diagnosis: 1. Alcoholic hepatitis 2. Gastritis due to alcohol 3. Asymptomatic cholelithiaisis 4. HIV  Principal Problem:   Alcoholic hepatitis Active Problems:   Human immunodeficiency virus (HIV) disease (HCC)   Transaminitis   Gastritis due to alcohol without hemorrhage   Asymptomatic cholelithiasis   Discharge Medications: Allergies as of 09/26/2016   No Known Allergies     Medication List    TAKE these medications   acetaminophen 325 MG tablet Commonly known as:  TYLENOL Take 650 mg by mouth every 6 (six) hours as needed for mild pain or fever.   darunavir-cobicistat 800-150 MG tablet Commonly known as:  PREZCOBIX Take 1 tablet by mouth daily. Swallow whole. Do NOT crush, break or chew tablets. Take with food.   dolutegravir 50 MG tablet Commonly known as:  TIVICAY Take 1 tablet (50 mg total) by mouth daily.   hydrochlorothiazide 50 MG tablet Commonly known as:  HYDRODIURIL TAKE 1 TABLET (50 MG TOTAL) BY MOUTH DAILY. What changed:  Another medication with the same name was removed. Continue taking this medication, and follow the directions you see here.   omeprazole 20 MG capsule Commonly known as:  PRILOSEC Take 1 capsule (20 mg total) by mouth daily.   tenofovir 300 MG tablet Commonly known as:  VIREAD Take 1 tablet (300 mg total) by mouth daily.       Disposition and follow-up:   [name] was [facility] in Good condition.  At the hospital follow up visit please address:  1.  Alcoholic hepatitis - assess alcohol intake, liver function, consider hepatitis vaccines  Gastritis due to alcohol - assess abdominal pain, resolution with prescribed daily PPI  Asymptomatic cholelithiasis - assess for  postprandial, RUQ pain, may consider elective cholecystectomy if becomes symptomatic in the future  HIV - assess adherence to ART  2.  Labs / imaging needed at time of follow-up: CMP, CBC  3.  Pending labs/ test needing follow-up: CD4 count, HIV viral load  Follow-up Appointments: Follow-up Information    [name], MD. Go on 09/29/2016.   Specialty:  Infectious Diseases Why:  At 4:15 PM, please arrive 15 minutes early to check in Contact information: [address] [place] [address] [phone]           Hospital Course by problem list: Principal Problem:   Alcoholic hepatitis Active Problems:   Human immunodeficiency virus (HIV) disease (HCC)   Transaminitis   Gastritis due to alcohol without hemorrhage   Asymptomatic cholelithiasis   1. Alcoholic hepatitis Mr. Darryl Berg is a 41 y.o. gentleman with PMH HIV, depression, alcohol abuse who presented on 1/27 for persistent generalized abdominal pain that awoke him from sleep the day prior. On presentation labs revealed elevated ethanol, mildly elevated AST, ALT (2:1), normal ALP, abdominal ultrasound revealed cholelithiasis but no cholecystitis. He experienced significant pain relief with GI cocktail, Pepcid, and Carafate. He was observed overnight and diet was gradually advanced. He remained asymptomatic on 1/28 with LFTs trending down and no return of symptoms, tolerating full diet. CIWA monitoring scores remained 0, alcohol cessation was discussed with patient. He was deemed stable for discharge with close follow up with his PCP.  2. Gastritis due to alcohol - endorsed recent alcohol consumption and presented with elevated  ethanol, also somewhat heavy regular alcohol use of 5-6 drinks of liquor on weekends. Generalized abdominal pain resolved with GI cocktail, Pepcid, and Carafate and did not recur. CIWA scores remained 0. Alcohol cessation discussed and encouraged. He was prescribed omeprazole daily on  discharge.  3. HIV - history of well-controlled HIV and reported compliance with ART, checked CD4 count and HIV viral load and patient is scheduled to follow up with his ID provider 3 days after discharge.   Discharge Vitals:   BP 139/85 (BP Location: Left Arm)   Pulse 73   Temp 98.4 F (36.9 C) (Oral)   Resp 18   Ht 5\' 6"  (1.676 m)   Wt 170 lb (77.1 kg)   SpO2 100%   BMI 27.44 kg/m   Pertinent Labs, Studies, and Procedures:  CMP Latest Ref Rng & Units 09/26/2016 09/25/2016 11/06/2015  Glucose 65 - 99 mg/dL 85 161(W) 77  BUN 6 - 20 mg/dL 7 10 10   Creatinine 0.61 - 1.24 mg/dL 9.60(A) 5.40 9.81  Sodium 135 - 145 mmol/L 138 138 137  Potassium 3.5 - 5.1 mmol/L 3.4(L) 3.9 3.8  Chloride 101 - 111 mmol/L 105 100(L) 100  CO2 22 - 32 mmol/L 27 26 24   Calcium 8.9 - 10.3 mg/dL 1.9(J) 9.2 4.7(W)  Total Protein 6.5 - 8.1 g/dL 6.9 2.9(F) 7.3  Total Bilirubin 0.3 - 1.2 mg/dL 0.7 6.2(Z) 0.3  Alkaline Phos 38 - 126 U/L 79 89 43  AST 15 - 41 U/L 94(H) 604(H) 24  ALT 17 - 63 U/L 134(H) 201(H) 20   US Abdomen Complete - 09/25/16 FINDINGS: Gallbladder: 7 mm gallstone within the gallbladder. No wall thickening or sonographic Murphy sign. Trace pericholecystic fluid.  Common bile duct: Diameter: Upper limits normal caliber, 7 mm  Liver: No focal lesion identified. Within normal limits in parenchymal echogenicity.  IVC: No abnormality visualized.  Pancreas: Visualized portion unremarkable.  Spleen: Size and appearance within normal limits.  Right Kidney: Length: 11.7 cm. Echogenicity within normal limits. No mass or hydronephrosis visualized.  Left Kidney: Length: 12.0 cm. Echogenicity within normal limits. No mass or hydronephrosis visualized.  Abdominal aorta: No aneurysm visualized.  Other findings: None.  IMPRESSION: Cholelithiasis. Trace pericholecystic fluid without sonographic Murphy sign. Common bile duct borderline diameter at 7 mm.  Discharge  Instructions: Discharge Instructions    Diet - low sodium heart healthy    Complete by:  As directed    Discharge instructions    Complete by:  As directed    Please continue to take your medications as prescribed. We have added one new medication to help with reflux pain - please take Omeprazole (Prilosec) 20 mg daily for the next month.   We also strongly encourage you to cut down, or ideally, stop drinking alcohol altogether - as this habit has led to some liver injury.   Please make sure to follow up with Dr. Orvan Falconer in the near future. You can discuss the possibility to getting a hepatitis B vaccine in the future.   Increase activity slowly    Complete by:  As directed       Signed: Althia Forts, MD 09/26/2016, 8:46 PM   Pager: [phone]

## 2016-09-26 NOTE — Discharge Instructions (Signed)
Alcoholic Hepatitis Alcoholic hepatitis is liver inflammation caused by drinking alcohol. This inflammation decreases the liver's ability to function normally.  CAUSES  Alcoholic hepatitis is caused by heavy drinking.  RISK FACTORS You may have an increased risk of alcoholic hepatitis if:   You drink large amounts of alcohol.  You have been drinking heavily for years.  You binge drink.  You are male.  You are obese.  You have had infectious hepatitis.  You are malnourished.  You have close family members who have had alcoholic hepatitis. SIGNS AND SYMPTOMS  Abdominal pain.  A swollen abdomen.  Loss of appetite.  Unintentional weight loss.  Nausea and vomiting.  Tiredness.  Dry mouth.  Severe thirst.  A yellow tone to the skin and whites of the eyes (jaundice).  Spidery veins, especially across the skin of the abdomen.  Unusual bleeding.  Itching.  Trouble thinking clearly.  Memory problems.  Mood changes.  Confusion.  Numbness and tingling in the feet and legs. DIAGNOSIS  Alcoholic hepatitis is diagnosed with blood tests that show problems with liver function. Additional tests may also be done, such as:  An ultrasound.  A CT scan.  An MRI scan.  A liver biopsy. For this test, a small sample of the liver will be taken and examined for evidence of liver damage. TREATMENT The most important step you can take to treat alcoholic hepatitis is to stop drinking alcohol. If you are addicted to alcohol, your health care provider will help you create a plan to quit. It may involve:  Taking medicine to decrease withdrawal symptoms.  Entering a program to help you stop drinking.  Joining a support group. Additional treatment for alcoholic hepatitis may include:  Medicines such as steroids. The medicines will help decrease the inflammation.  Diet. Your health care provider might ask you to undergo nutritional counseling and follow a diet. You may  also need to take dietary supplements and vitamins.  A liver transplant. This procedure is performed in very severe cases. It is only performed on people who have totally stopped drinking and can commit to never drinking alcohol again. HOME CARE INSTRUCTIONS  Do not drink alcohol.  Do not use medicines or eat foods that contain alcohol.  Take medicines only as directed by your health care provider.  Follow dietary instructions carefully.  Keep all follow-up visits as directed by your health care provider. This is important. SEEK MEDICAL CARE IF:  You have a fever.  You do not have your usual appetite.  You have flu-like symptoms such as fatigue, weakness, or muscle aches.  You feel nauseous or vomit.  You bruise easily.  Your urine is very dark.  You have new abdominal pain. SEEK IMMEDIATE MEDICAL CARE IF:  There is blood in your vomit.  You develop jaundice.  Your skin itches severely.  Your legs swell.  Your stomach appears bloated.  You have black, tarry-appearing stools.  You bleed easily.  You are confused or not thinking clearly.  You have a seizure. MAKE SURE YOU:  Understand these instructions.  Will watch your condition.  Will get help right away if you are not doing well or get worse. This information is not intended to replace advice given to you by your health care provider. Make sure you discuss any questions you have with your health care provider. Document Released: 03/13/2014 Document Reviewed: 03/13/2014 Elsevier Interactive Patient Education  2017 Elsevier Inc.      Gastritis, Adult Gastritis is soreness and  puffiness (inflammation) of the lining of the stomach. If you do not get help, gastritis can cause bleeding and sores (ulcers) in the stomach. HOME CARE   Only take medicine as told by your doctor.  If you were given antibiotic medicines, take them as told. Finish the medicines even if you start to feel better.  Drink  enough fluids to keep your pee (urine) clear or pale yellow.  Avoid foods and drinks that make your problems worse. Foods you may want to avoid include:  Caffeine or alcohol.  Chocolate.  Mint.  Garlic and onions.  Spicy foods.  Citrus fruits, including oranges, lemons, or limes.  Food containing tomatoes, including sauce, chili, salsa, and pizza.  Fried and fatty foods.  Eat small meals throughout the day instead of large meals. GET HELP RIGHT AWAY IF:   You have black or dark red poop (stools).  You throw up (vomit) blood. It may look like coffee grounds.  You cannot keep fluids down.  Your belly (abdominal) pain gets worse.  You have a fever.  You do not feel better after 1 week.  You have any other questions or concerns. MAKE SURE YOU:   Understand these instructions.  Will watch your condition.  Will get help right away if you are not doing well or get worse. This information is not intended to replace advice given to you by your health care provider. Make sure you discuss any questions you have with your health care provider. Document Released: 02/02/2008 Document Revised: 11/08/2011 Document Reviewed: 05/10/2015 Elsevier Interactive Patient Education  2017 [employer]

## 2016-09-26 NOTE — Progress Notes (Signed)
Darryl Berg to be D/C'd Home per MD order.  Discussed with the patient and all questions fully answered.  IV catheter discontinued intact. Site without signs and symptoms of complications. Dressing and pressure applied.  An After Visit Summary was printed and given to the patient. Patient received prescription.  D/c education completed with patient/family including follow up instructions, medication list, d/c activities limitations if indicated, with other d/c instructions as indicated by MD - patient able to verbalize understanding, all questions fully answered.   Patient instructed to return to ED, call 911, or call MD for any changes in condition.   Patient escorted via WC, and D/C home via private auto.  [name] 09/26/2016 3:13 PM

## 2016-09-27 LAB — T-HELPER CELLS (CD4) COUNT (NOT AT ARMC)
CD4 T CELL ABS: 510 /uL (ref 400–2700)
CD4 T CELL HELPER: 19 % — AB (ref 33–55)

## 2016-09-27 LAB — HIV-1 RNA QUANT-NO REFLEX-BLD
HIV 1 RNA Quant: 590 copies/mL
LOG10 HIV-1 RNA: 2.771 {Log_copies}/mL

## 2016-09-29 ENCOUNTER — Ambulatory Visit (INDEPENDENT_AMBULATORY_CARE_PROVIDER_SITE_OTHER): Payer: Self-pay | Admitting: Internal Medicine

## 2016-09-29 ENCOUNTER — Encounter: Payer: Self-pay | Admitting: Internal Medicine

## 2016-09-29 DIAGNOSIS — K7010 Alcoholic hepatitis without ascites: Secondary | ICD-10-CM

## 2016-09-29 DIAGNOSIS — N289 Disorder of kidney and ureter, unspecified: Secondary | ICD-10-CM | POA: Insufficient documentation

## 2016-09-29 DIAGNOSIS — B20 Human immunodeficiency virus [HIV] disease: Secondary | ICD-10-CM

## 2016-09-29 NOTE — Assessment & Plan Note (Signed)
I talked him again about his alcohol intake and how it is causing significant health problems. He continues to be in denial and consistently changes the subject. He would like to believe that his pain was due to acid indigestion or the fact that he did not eat meals consistently just before the pain began.

## 2016-09-29 NOTE — Assessment & Plan Note (Signed)
I will recheck his creatinine today. If it remains elevated we will craft a new antiretroviral regimen that excludes Viread.

## 2016-09-29 NOTE — Assessment & Plan Note (Signed)
His viral load has been creeping up over the past few years. I'm concerned that his alcohol use is impacting his adherence. I met with him today along with our ID pharmacist and discussed adherence tips. We suggested that he filled out his weekly pillbox over the weekend. He was also given a small pill canister to fill up and keep in his locker at work in case he forgets to take his medication to work with him. He will follow-up after lab work in 6 weeks.

## 2016-09-29 NOTE — Progress Notes (Signed)
Patient Active Problem List   Diagnosis Date Noted  . Essential hypertension 07/01/2009    Priority: High  . Human immunodeficiency virus (HIV) disease ([place]) 10/17/2006    Priority: High  . Acute renal insufficiency 09/29/2016  . Gastritis due to alcohol without hemorrhage 09/26/2016  . Alcoholic hepatitis 93/90/3009  . Asymptomatic cholelithiasis 09/26/2016  . Transaminitis 09/25/2016  . 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 ANXIETY 10/17/2006  . ABUSE, ALCOHOL, CONTINUOUS 10/17/2006  . DISORDER, TOBACCO USE 10/17/2006  . Depression 10/17/2006    Patient's Medications  New Prescriptions   No medications on file  Previous Medications   ACETAMINOPHEN (TYLENOL) 325 MG TABLET    Take 650 mg by mouth every 6 (six) hours as needed for mild pain or fever.   DARUNAVIR-COBICISTAT (PREZCOBIX) 800-150 MG TABLET    Take 1 tablet by mouth daily. Swallow whole. Do NOT crush, break or chew tablets. Take with food.   DOLUTEGRAVIR (TIVICAY) 50 MG TABLET    Take 1 tablet (50 mg total) by mouth daily.   HYDROCHLOROTHIAZIDE (HYDRODIURIL) 50 MG TABLET    TAKE 1 TABLET (50 MG TOTAL) BY MOUTH DAILY.   OMEPRAZOLE (PRILOSEC) 20 MG CAPSULE    Take 1 capsule (20 mg total) by mouth daily.   TENOFOVIR (VIREAD) 300 MG TABLET    Take 1 tablet (300 mg total) by mouth daily.  Modified Medications   No medications on file  Discontinued Medications   No medications on file    Subjective: Darryl Berg is in for his hospital follow-up visit. He recently developed severe, 10 out of 10 abdominal pain and was hospitalized for 2 days. His liver transaminases were quite elevated with an AST of 604 and ALT of 201. He did admit to drinking more whiskey over the recent holidays. He states that he does get the shakes when he does not drink on occasion. His pain improved and his liver enzymes came down fairly promptly but he did have an increase in his serum creatinine to  1.32.  He states that he takes his Viread, Tivicay and Prezcobix each morning with breakfast at work. Initially, he indicated that he rarely missed doses but then admitted that he was out for about 3 weeks last fall when he failed to renew his ADAP on time. He states that he also will occasionally forget and Ms. are forget to take his medication to work and then take it when he gets home at night. He is using an individual daily pillbox that he fills out each evening before going to work in the morning.  He has been having problems with intermittent acid indigestion for which he takes Prilosec intermittently. It's unclear how long this has been a problem.  Review of Systems: Review of Systems  Constitutional: Negative for chills, diaphoresis, fever, malaise/fatigue and weight loss.  HENT: Negative for sore throat.   Respiratory: Negative for cough, sputum production and shortness of breath.   Cardiovascular: Negative for chest pain.  Gastrointestinal: Positive for abdominal pain and heartburn. Negative for constipation, diarrhea, nausea and vomiting.  Skin: Negative for rash.  Psychiatric/Behavioral: Positive for substance abuse. Negative for depression. The patient is not nervous/anxious.     Past Medical History:  Diagnosis Date  . Depression   . HIV disease ([place])   . Substance abuse     Social History  Substance Use Topics  . Smoking status: Never Smoker  .  Smokeless tobacco: Never Used  . Alcohol use 7.2 oz/week    12 Standard drinks or equivalent per week     Comment: weekends    Family History  Problem Relation Age of Onset  . Hypertension Father     No Known Allergies  Objective:  Vitals:   09/29/16 1629  BP: (!) 158/96  Pulse: 87  Temp: 98.1 F (36.7 C)  TempSrc: Oral  Weight: 170 lb (77.1 kg)  Height: _0  (1.702 m)   Body mass index is 26.63 kg/m.  Physical Exam  Constitutional: He is oriented to person, place, and time. No distress.  HENT:    Mouth/Throat: No oropharyngeal exudate.  Cardiovascular: Normal rate and regular rhythm.   No murmur heard. Pulmonary/Chest: Effort normal and breath sounds normal.  Abdominal: Soft. He exhibits no distension. There is no tenderness.  Neurological: He is alert and oriented to person, place, and time.  Skin: No rash noted.  Psychiatric: Mood and affect normal.    Lab Results Lab Results  Component Value Date   WBC 4.5 09/26/2016   HGB 13.7 09/26/2016   HCT 41.4 09/26/2016   MCV 91.4 09/26/2016   PLT 221 09/26/2016    Lab Results  Component Value Date   CREATININE 1.32 (H) 09/26/2016   BUN 7 09/26/2016   NA 138 09/26/2016   K 3.4 (L) 09/26/2016   CL 105 09/26/2016   CO2 27 09/26/2016    Lab Results  Component Value Date   ALT 134 (H) 09/26/2016   AST 94 (H) 09/26/2016   ALKPHOS 79 09/26/2016   BILITOT 0.7 09/26/2016    Lab Results  Component Value Date   CHOL 158 11/06/2015   HDL 50 11/06/2015   LDLCALC 60 11/06/2015   TRIG 241 (H) 11/06/2015   CHOLHDL 3.2 11/06/2015   HIV 1 RNA Quant (copies/mL)  Date Value  09/26/2016 590  12/15/2015 162 (H)  11/06/2015 241 (H)   CD4 T Cell Abs (/uL)  Date Value  09/26/2016 510  11/06/2015 270 (L)  03/27/2015 300 (L)     Problem List Items Addressed This Visit      High   Human immunodeficiency virus (HIV) disease ([place])    His viral load has been creeping up over the past few years. I'm concerned that his alcohol use is impacting his adherence. I met with him today along with our ID pharmacist and discussed adherence tips. We suggested that he filled out his weekly pillbox over the weekend. He was also given a small pill canister to fill up and keep in his locker at work in case he forgets to take his medication to work with him. He will follow-up after lab work in 6 weeks.      Relevant Orders   Comprehensive metabolic panel   T-helper cell (CD4)- ([facility] only)   HIV 1 RNA quant-no reflex-bld   Comprehensive  metabolic panel   RPR     Unprioritized   Acute renal insufficiency    I will recheck his creatinine today. If it remains elevated we will craft a new antiretroviral regimen that excludes Viread.      Alcoholic hepatitis    I talked him again about his alcohol intake and how it is causing significant health problems. He continues to be in denial and consistently changes the subject. He would like to believe that his pain was due to acid indigestion or the fact that he did not eat meals consistently just before the  pain began.      Relevant Orders   Hepatitis B surface antibody        Michel Bickers, MD [facility] [place] [phone] pager   [phone] cell 09/29/2016, 5:20 PM

## 2016-09-29 NOTE — Progress Notes (Signed)
HPI: Darryl Berg is a 41 y.o. male who presents to the [facility] today for follow-up with Dr. Orvan Falconerampbell for his HIV infection.  He has extensive resistance and is on Prezcobix + Tivicay + Viread.  Allergies: No Known Allergies  Past Medical History: Past Medical History:  Diagnosis Date  . Depression   . HIV disease (HCC)   . Substance abuse     Social History: Social History   Social History  . Marital status: Single    Spouse name: N/A  . Number of children: N/A  . Years of education: N/A   Social History Main Topics  . Smoking status: Never Smoker  . Smokeless tobacco: Never Used  . Alcohol use 7.2 oz/week    12 Standard drinks or equivalent per week     Comment: weekends  . Drug use: No  . Sexual activity: Yes    Partners: Male    Birth control/ protection: Condom     Comment: accepted condoms   Other Topics Concern  . None   Social History Narrative  . None    Current Regimen: Prezcobix + Tivicay + Viread  Labs: HIV 1 RNA Quant (copies/mL)  Date Value  09/26/2016 590  12/15/2015 162 (H)  11/06/2015 241 (H)   CD4 T Cell Abs (/uL)  Date Value  09/26/2016 510  11/06/2015 270 (L)  03/27/2015 300 (L)   Hep B S Ab (no units)  Date Value  10/24/2006 Yes   Hepatitis B Surface Ag (no units)  Date Value  09/25/2016 Negative   HCV Ab (s/co ratio)  Date Value  09/25/2016 <0.1    CrCl: Estimated Creatinine Clearance: 68.9 mL/min (by C-G formula based on SCr of 1.32 mg/dL (H)).  Lipids:    Component Value Date/Time   CHOL 158 11/06/2015 1539   TRIG 241 (H) 11/06/2015 1539   HDL 50 11/06/2015 1539   CHOLHDL 3.2 11/06/2015 1539   VLDL 48 (H) 11/06/2015 1539   LDLCALC 60 11/06/2015 1539    Assessment: Darryl Berg is here today for follow-up. He has no showed several times for clinic appointments with Dr. Orvan Falconerampbell.  He has extensive resistance.  His mutations and genotype is listed below.   RT Mutations M184V, V90I, K103N, P225H  PI Mutations  L90M, K20I, A71IT  Integrase Mutations    Interpretation of Genotype Data per [employer] HIV Database  Nucleoside RTIs  lamivudine (3TC) - High-level resistance abacavir (ABC) - Low-level resistance zidovudine (AZT) - Susceptible stavudine (D4T) - Susceptible didanosine (DDI)  - Potential low-level resistance emtricitabine (FTC) - High-level resistance tenofovir (TDF) - Susceptible   Non-Nucleoside RTIs  efavirenz (EFV) - High-level resistance etravirine (ETR) - Susceptible nevirapine (NVP) - High-level resistance rilpivirine (RPV) - Susceptible   Protease Inhibitors  atazanavir/r (ATV/r) -Intermediate resistance darunavir/r (DRV/r) - Susceptible fosamprenavir/r (FPV/r) - Low-level resistance indinavir/r (IDV/r) - Intermediate resistance lopinavir/r (LPV/r) - Low-level resistance nelfinavir (NFV) - High-level resistance saquinavir/r (SQV/r) - Intermediate resistance tipranavir/r (TPV/r) - Susceptible   Integrase Inhibitors  None   He has been on Prezcobix + Tivicay + Viread.  He has had problems with alcoholism in the past and was recently admitted to the hospital last weekend for severe stomach pains.  His LFTs were significantly high.  He admits to drinking a bottle of whiskey on Friday night.  When asked how many doses he has missed in the last month of his HIV medications, he says once when he forgot to take it with him to work.  He then states he sometimes takes it in the morning and then sometimes at night when he forgets.  He did run out of meds for ~3 weeks 6 months ago when his ADAP expired.  His SCr was also elevated when checked in the hospital last weekend - it was 1.32.  Worried that the Viread is contributing to that.  We did discuss changing him to [place] + Prezcobix, but he does take Prilosec on occasion.  He attributes his stomach pain to acid reflux when it is most likely due to alcoholism. Dr. Orvan Falconer explained this to him in great detail.  We will check his  kidney function again today.  He will follow-up in 6 weeks.  I will f/u on his SCr and discuss whether to bring him back in earlier to stop Viread.  I gave him a large and small pill box keychain and also a new big weekly pill box.   Plans: - Continue Prezcobix, Tivicay, and Viread for now - Recheck labs - Will f/u on labs to see if need to stop Viread  Cassie L. Paulino Door, PharmD Infectious Diseases Clinical Pharmacist [facility] 09/29/2016, 4:55 PM

## 2016-09-30 LAB — COMPREHENSIVE METABOLIC PANEL
ALT: 53 U/L — ABNORMAL HIGH (ref 9–46)
AST: 31 U/L (ref 10–40)
Albumin: 4.5 g/dL (ref 3.6–5.1)
Alkaline Phosphatase: 62 U/L (ref 40–115)
BUN: 12 mg/dL (ref 7–25)
CALCIUM: 9.2 mg/dL (ref 8.6–10.3)
CO2: 27 mmol/L (ref 20–31)
Chloride: 103 mmol/L (ref 98–110)
Creat: 1.03 mg/dL (ref 0.60–1.35)
GLUCOSE: 76 mg/dL (ref 65–99)
POTASSIUM: 3.9 mmol/L (ref 3.5–5.3)
Sodium: 139 mmol/L (ref 135–146)
Total Bilirubin: 0.7 mg/dL (ref 0.2–1.2)
Total Protein: 7.9 g/dL (ref 6.1–8.1)

## 2016-09-30 LAB — HEPATITIS B SURFACE ANTIBODY,QUALITATIVE: Hep B S Ab: NEGATIVE

## 2016-11-30 ENCOUNTER — Ambulatory Visit: Payer: Self-pay | Admitting: Internal Medicine

## 2017-01-10 ENCOUNTER — Ambulatory Visit: Payer: Self-pay

## 2017-01-19 ENCOUNTER — Encounter: Payer: Self-pay | Admitting: Internal Medicine

## 2017-03-07 ENCOUNTER — Telehealth: Payer: Self-pay

## 2017-03-07 NOTE — Telephone Encounter (Signed)
Patient calling to request same day labs.  We are not able to accomodate him today for labs.  I offered a Tuesday appointment but he refused.  He states his wife is pregnant and he would like labs today since he was off today.  Our staffing for lab today is full and  understaffed.  I offered next day labs. He refused.  I checked his note and last office visit in January he was told to follow up in three months. I informed the patient he would need to see the physician so I will schedule visit and labs two weeks    Patient stated again he just found out his wife is pregnant and he is concerned about his viral load.  I gave him the result of his viral load and informed him he was not undetectable.  I asked when his wife became pregnant and she stated in April, 2018. I asked if she had been for her obstetrics visit and he said yes all her test were negative. I then asked if she had an HIV test and he said her test were normal. It was not clear if she has seen a OB physician,  just went to primary care or if HIV testing was done.   Patient was very upset and asked to speak with Angelique Blonderenise or Dr Orvan Falconerampbell since I was not being helpful.  Patient was advised again of his  last viral load and performing a test today would not change the fact his wife became pregnant while he was possible detectable. While trying to schedule visit the patient disconnected the call.    He called back on a different extension and spoke with Annice PihJackie.   Laurell Josephsammy K Dorean Hiebert, RN

## 2017-03-07 NOTE — Telephone Encounter (Signed)
Patient called and stated his name; I did not understand him and put the call on speaker for assistance from Tammy who is in triage with me. Patient immediately stated, "do NOT put me on speaker." I took him off speaker and apologized. Asked how I could help him and he said he wanted an appt to see Dr. Orvan Falconerampbell. Advised him that the next appt was end of July. He then said he didn't need to see the doctor, he just needed to come in for lab work. Advised him that Tammy was trying to help him and he said no she was not and wanted to talk to [place], Charity fundraiserN. Explained that Angelique BlonderDenise is not working on a daily basis and is actually retired and filling in PRN. I offered several appts for labs and he said he could not get off of work. Scheduled him with Dr. Blair Dolphinampbell's first available appt which is 03/24/17 and he would like to have lab work the same day. He then said, "tell Amy not to share my personal information". Asked, Amy with [facility] and he said, "no, Amy who was talking to me before." I said her name is Tammy and I only knew about the previous phone call because I am sitting here in the same room. An hour later Olegario MessierKathy, Oceanographerfinancial assistant came into triage to advise that the patient called her to complain about his treatment from the triage staff.

## 2017-03-14 ENCOUNTER — Other Ambulatory Visit: Payer: Self-pay | Admitting: Internal Medicine

## 2017-03-14 DIAGNOSIS — B20 Human immunodeficiency virus [HIV] disease: Secondary | ICD-10-CM

## 2017-03-23 ENCOUNTER — Ambulatory Visit: Payer: Self-pay

## 2017-03-24 ENCOUNTER — Ambulatory Visit: Payer: Self-pay

## 2017-03-24 ENCOUNTER — Ambulatory Visit: Payer: Self-pay | Admitting: Internal Medicine

## 2017-04-21 ENCOUNTER — Other Ambulatory Visit: Payer: Self-pay | Admitting: Internal Medicine

## 2017-04-21 DIAGNOSIS — I10 Essential (primary) hypertension: Secondary | ICD-10-CM

## 2017-05-10 ENCOUNTER — Ambulatory Visit: Payer: Self-pay

## 2017-05-17 ENCOUNTER — Other Ambulatory Visit: Payer: Self-pay

## 2017-05-25 ENCOUNTER — Other Ambulatory Visit: Payer: Self-pay

## 2017-05-25 DIAGNOSIS — B20 Human immunodeficiency virus [HIV] disease: Secondary | ICD-10-CM

## 2017-05-26 LAB — RPR: RPR Ser Ql: NONREACTIVE

## 2017-05-26 LAB — COMPREHENSIVE METABOLIC PANEL
AG Ratio: 1.4 (calc) (ref 1.0–2.5)
ALBUMIN MSPROF: 4 g/dL (ref 3.6–5.1)
ALKALINE PHOSPHATASE (APISO): 42 U/L (ref 40–115)
ALT: 23 U/L (ref 9–46)
AST: 29 U/L (ref 10–40)
BILIRUBIN TOTAL: 0.6 mg/dL (ref 0.2–1.2)
BUN: 13 mg/dL (ref 7–25)
CHLORIDE: 103 mmol/L (ref 98–110)
CO2: 27 mmol/L (ref 20–32)
CREATININE: 1.13 mg/dL (ref 0.60–1.35)
Calcium: 8.3 mg/dL — ABNORMAL LOW (ref 8.6–10.3)
GLOBULIN: 2.8 g/dL (ref 1.9–3.7)
Glucose, Bld: 87 mg/dL (ref 65–99)
POTASSIUM: 4.2 mmol/L (ref 3.5–5.3)
SODIUM: 137 mmol/L (ref 135–146)
Total Protein: 6.8 g/dL (ref 6.1–8.1)

## 2017-05-27 LAB — T-HELPER CELL (CD4) - (RCID CLINIC ONLY)
CD4 % Helper T Cell: 17 % — ABNORMAL LOW (ref 33–55)
CD4 T Cell Abs: 390 /uL — ABNORMAL LOW (ref 400–2700)

## 2017-05-27 LAB — HIV-1 RNA QUANT-NO REFLEX-BLD
HIV 1 RNA Quant: <20 copies/mL
HIV-1 RNA Quant, Log: <1.3 Log copies/mL

## 2017-05-31 ENCOUNTER — Ambulatory Visit: Payer: Self-pay | Admitting: Internal Medicine

## 2017-06-08 ENCOUNTER — Ambulatory Visit (INDEPENDENT_AMBULATORY_CARE_PROVIDER_SITE_OTHER): Payer: Self-pay | Admitting: Internal Medicine

## 2017-06-08 DIAGNOSIS — I10 Essential (primary) hypertension: Secondary | ICD-10-CM

## 2017-06-08 DIAGNOSIS — B20 Human immunodeficiency virus [HIV] disease: Secondary | ICD-10-CM

## 2017-06-08 DIAGNOSIS — Z23 Encounter for immunization: Secondary | ICD-10-CM

## 2017-06-08 DIAGNOSIS — F1010 Alcohol abuse, uncomplicated: Secondary | ICD-10-CM

## 2017-06-08 MED ORDER — DARUNAVIR-COBICISTAT 800-150 MG PO TABS: 1.0000 | ORAL_TABLET | Freq: Every day | ORAL | 11 refills | Status: DC

## 2017-06-08 MED ORDER — BICTEGRAVIR-EMTRICITAB-TENOFOV 50-200-25 MG PO TABS: 1.0000 | ORAL_TABLET | Freq: Every day | ORAL | 0 refills | Status: DC

## 2017-06-08 MED ORDER — HYDROCHLOROTHIAZIDE 50 MG PO TABS: 50.0000 mg | ORAL_TABLET | Freq: Every day | ORAL | 11 refills | Status: DC

## 2017-06-08 MED ORDER — HYDROCHLOROTHIAZIDE 50 MG PO TABS: 50.0000 mg | ORAL_TABLET | Freq: Every day | ORAL | 0 refills | Status: DC

## 2017-06-08 MED ORDER — BICTEGRAVIR-EMTRICITAB-TENOFOV 50-200-25 MG PO TABS: 1.0000 | ORAL_TABLET | Freq: Every day | ORAL | 11 refills | Status: DC

## 2017-06-08 MED FILL — BIKTARVY 50-200-25 MG TABS: 50-200-25 | 30 days supply | Qty: 30 | Fill #0
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
## 2017-06-08 NOTE — Assessment & Plan Note (Addendum)
His viral load is back to undetectable. It sounds like he was frightened at the time of his last visit when his viral load had bumped up over 500. This is motivated him to do better job of taking his medication. He has had some renal insufficiency earlier this year. I discussed options with our pharmacist, [place], and we decided to change him to [place] and Prezcobix. He will follow-up after lab work in 6 months.  I talked to him about PrEP therapy for his wife once she is through her current pregnancy.

## 2017-06-08 NOTE — Progress Notes (Signed)
HPI: Darryl Berg is a 41 y.o. male who is here for a f/u with Dr. Orvan Falconer for his HIV.   Allergies: No Known Allergies  Vitals: Temp: 98 F (36.7 C) (10/10 1609) Temp Source: Oral (10/10 1609) BP: 149/104 (10/10 1609) Pulse Rate: 72 (10/10 1609)  Past Medical History: Past Medical History:  Diagnosis Date  . Depression   . HIV disease (HCC)   . Substance abuse     Social History: Social History   Social History  . Marital status: Single    Spouse name: N/A  . Number of children: N/A  . Years of education: N/A   Social History Main Topics  . Smoking status: Never Smoker  . Smokeless tobacco: Never Used  . Alcohol use 7.2 oz/week    12 Standard drinks or equivalent per week     Comment: weekends  . Drug use: No  . Sexual activity: Yes    Partners: Male    Birth control/ protection: Condom     Comment: accepted condoms   Other Topics Concern  . Not on file   Social History Narrative  . No narrative on file    Previous Regimen:   Current Regimen: DTG/Prezcobix/TDF  Labs: HIV 1 RNA Quant (copies/mL)  Date Value  05/25/2017 <20 NOT DETECTED  09/26/2016 590  12/15/2015 162 (H)   CD4 T Cell Abs (/uL)  Date Value  05/25/2017 390 (L)  09/26/2016 510  11/06/2015 270 (L)   Hep B S Ab (no units)  Date Value  09/29/2016 NEG   Hepatitis B Surface Ag (no units)  Date Value  09/25/2016 Negative   HCV Ab (s/co ratio)  Date Value  09/25/2016 <0.1    CrCl: CrCl cannot be calculated (Unknown ideal weight.).  Lipids:    Component Value Date/Time   CHOL 158 11/06/2015 1539   TRIG 241 (H) 11/06/2015 1539   HDL 50 11/06/2015 1539   CHOLHDL 3.2 11/06/2015 1539   VLDL 48 (H) 11/06/2015 1539   LDLCALC 60 11/06/2015 1539    Assessment: Darryl Berg has been well controlled on his current regimen. We used this regimen partly due to his resistance and renal function issue. With some of the new therapy available, we are planing on switching him to  Biktarvy+Prezcobix. Both drugs are on the ADAP formulary. However, his ADAP has not been submitted yet. He will bring back the pay stubs tomorrow. We told him to finish out the supply he has at home then we will use the 30d supply of the Biktarvy that he has been approved for and the supply of Prezista and ritonavir that we gave him. He should be approved for ADAP by then.   Recommendations:  Finish out TDF/DTG/Prezcobix Then start Biktarvy/Prezcobix Darryl Berg, PharmD, BCPS, AAHIVP, CPP Clinical Infectious Disease Pharmacist [facility] 06/08/2017, 4:59 PM

## 2017-06-08 NOTE — Assessment & Plan Note (Signed)
He said that he is aware that he drinks too heavily on weekends. He was in the emergency room earlier this year after drinking. He also draws a direct association between heavy alcohol consumption and his acid reflux.

## 2017-06-08 NOTE — Assessment & Plan Note (Signed)
His blood pressure is above goal off of hydrochlorothiazide. I refilled it today.

## 2017-07-06 ENCOUNTER — Encounter: Payer: Self-pay | Admitting: Internal Medicine

## 2017-09-30 ENCOUNTER — Ambulatory Visit: Payer: Self-pay

## 2017-11-23 ENCOUNTER — Other Ambulatory Visit: Payer: Self-pay

## 2017-12-07 ENCOUNTER — Ambulatory Visit (INDEPENDENT_AMBULATORY_CARE_PROVIDER_SITE_OTHER): Payer: Self-pay | Admitting: Internal Medicine

## 2017-12-07 ENCOUNTER — Encounter: Payer: Self-pay | Admitting: Internal Medicine

## 2017-12-07 ENCOUNTER — Ambulatory Visit (INDEPENDENT_AMBULATORY_CARE_PROVIDER_SITE_OTHER): Payer: Self-pay | Admitting: Licensed Clinical Social Worker

## 2017-12-07 DIAGNOSIS — F1010 Alcohol abuse, uncomplicated: Secondary | ICD-10-CM

## 2017-12-07 DIAGNOSIS — B20 Human immunodeficiency virus [HIV] disease: Secondary | ICD-10-CM

## 2017-12-07 DIAGNOSIS — F1990 Other psychoactive substance use, unspecified, uncomplicated: Secondary | ICD-10-CM

## 2017-12-07 DIAGNOSIS — F331 Major depressive disorder, recurrent, moderate: Secondary | ICD-10-CM
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
## 2017-12-07 NOTE — Assessment & Plan Note (Signed)
He has acute, situational depression.  I had him meet with our mental health counselor today.

## 2017-12-07 NOTE — Assessment & Plan Note (Signed)
His adherence sounds like it has been very good and his infection was well controlled as of his last lab draw in September.  He will continue his current regimen, get blood work today and follow-up in 3 months.
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
## 2017-12-08 LAB — T-HELPER CELL (CD4) - (RCID CLINIC ONLY)
CD4 % Helper T Cell: 15 % — ABNORMAL LOW (ref 33–55)
CD4 T Cell Abs: 300 /uL — ABNORMAL LOW (ref 400–2700)

## 2017-12-08 LAB — CBC
HCT: 41.3 % (ref 38.5–50.0)
Hemoglobin: 14.1 g/dL (ref 13.2–17.1)
MCH: 29.7 pg (ref 27.0–33.0)
MCHC: 34.1 g/dL (ref 32.0–36.0)
MCV: 86.9 fL (ref 80.0–100.0)
MPV: 9.4 fL (ref 7.5–12.5)
PLATELETS: 291 10*3/uL (ref 140–400)
RBC: 4.75 10*6/uL (ref 4.20–5.80)
RDW: 13.8 % (ref 11.0–15.0)
WBC: 6.9 10*3/uL (ref 3.8–10.8)

## 2017-12-08 LAB — COMPREHENSIVE METABOLIC PANEL WITH GFR
AG Ratio: 1.4 (calc) (ref 1.0–2.5)
ALT: 14 U/L (ref 9–46)
AST: 17 U/L (ref 10–40)
Albumin: 4.5 g/dL (ref 3.6–5.1)
Alkaline phosphatase (APISO): 114 U/L (ref 40–115)
BUN: 10 mg/dL (ref 7–25)
CO2: 27 mmol/L (ref 20–32)
Calcium: 9.4 mg/dL (ref 8.6–10.3)
Chloride: 95 mmol/L — ABNORMAL LOW (ref 98–110)
Creat: 1.04 mg/dL (ref 0.60–1.35)
Globulin: 3.3 g/dL (ref 1.9–3.7)
Glucose, Bld: 89 mg/dL (ref 65–99)
Potassium: 3.5 mmol/L (ref 3.5–5.3)
Sodium: 135 mmol/L (ref 135–146)
Total Bilirubin: 0.6 mg/dL (ref 0.2–1.2)
Total Protein: 7.8 g/dL (ref 6.1–8.1)

## 2017-12-08 LAB — LIPID PANEL
CHOL/HDL RATIO: 4 (calc) (ref <5.0)
CHOLESTEROL: 199 mg/dL (ref <200)
HDL: 50 mg/dL (ref >40)
LDL CHOLESTEROL (CALC): 121 mg/dL — AB
NON-HDL CHOLESTEROL (CALC): 149 mg/dL — AB (ref <130)
TRIGLYCERIDES: 166 mg/dL — AB (ref <150)

## 2017-12-08 LAB — RPR: RPR Ser Ql: NONREACTIVE

## 2017-12-09 LAB — HIV-1 RNA QUANT-NO REFLEX-BLD
HIV 1 RNA QUANT: 180 {copies}/mL — AB
HIV-1 RNA Quant, Log: 2.26 Log copies/mL — ABNORMAL HIGH

## 2018-01-26 ENCOUNTER — Encounter: Payer: Self-pay | Admitting: Internal Medicine

## 2018-03-14 ENCOUNTER — Ambulatory Visit: Payer: Self-pay | Admitting: Internal Medicine

## 2018-03-28 ENCOUNTER — Encounter: Payer: Self-pay | Admitting: Internal Medicine

## 2018-03-28 ENCOUNTER — Ambulatory Visit (INDEPENDENT_AMBULATORY_CARE_PROVIDER_SITE_OTHER): Payer: Self-pay | Admitting: Internal Medicine

## 2018-03-28 VITALS — BP 147/101 | HR 76 | Temp 98.4°F | Wt 164.0 lb

## 2018-03-28 DIAGNOSIS — F3342 Major depressive disorder, recurrent, in full remission: Secondary | ICD-10-CM

## 2018-03-28 DIAGNOSIS — F1010 Alcohol abuse, uncomplicated: Secondary | ICD-10-CM

## 2018-03-28 DIAGNOSIS — N529 Male erectile dysfunction, unspecified: Secondary | ICD-10-CM

## 2018-03-28 DIAGNOSIS — B20 Human immunodeficiency virus [HIV] disease: Secondary | ICD-10-CM

## 2018-03-28 MED ORDER — TADALAFIL 5 MG PO TABS: 5.0000 mg | ORAL_TABLET | Freq: Every day | ORAL | 0 refills | Status: DC | PRN

## 2018-03-28 NOTE — Progress Notes (Signed)
Patient Active Problem List   Diagnosis Date Noted  . Essential hypertension 07/01/2009    Priority: High  . Human immunodeficiency virus (HIV) disease (HCC) 10/17/2006    Priority: High  . Acute renal insufficiency 09/29/2016  . Gastritis due to alcohol without hemorrhage 09/26/2016  . Alcoholic hepatitis 09/26/2016  . Asymptomatic cholelithiasis 09/26/2016  . Transaminitis 09/25/2016  . 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 ANXIETY 10/17/2006  . ABUSE, ALCOHOL, CONTINUOUS 10/17/2006  . DISORDER, TOBACCO USE 10/17/2006  . Depression 10/17/2006    Patient's Medications  New Prescriptions   No medications on file  Previous Medications   ACETAMINOPHEN (TYLENOL) 325 MG TABLET    Take 650 mg by mouth every 6 (six) hours as needed for mild pain or fever.   BICTEGRAVIR-EMTRICITABINE-TENOFOVIR AF (BIKTARVY) 50-200-25 MG TABS TABLET    Take 1 tablet by mouth daily.   BICTEGRAVIR-EMTRICITABINE-TENOFOVIR AF (BIKTARVY) 50-200-25 MG TABS TABLET    Take 1 tablet by mouth daily.   DARUNAVIR-COBICISTAT (PREZCOBIX) 800-150 MG TABLET    Take 1 tablet by mouth daily with breakfast. Swallow whole. Do NOT crush, break or chew tablets. Take with food.   HYDROCHLOROTHIAZIDE (HYDRODIURIL) 50 MG TABLET    Take 1 tablet (50 mg total) by mouth daily.   OMEPRAZOLE (PRILOSEC) 20 MG CAPSULE    Take 1 capsule (20 mg total) by mouth daily.  Modified Medications   No medications on file  Discontinued Medications   No medications on file    Subjective: JP is in for his routine HIV follow-up visit.  He denies any problems obtaining, taking or tolerating his Biktarvy or Prezcobix.  He takes them each morning.  He denies missing any doses since his last visit.  He will occasionally take his dose later on weekends when he sleeps in.  He is feeling much better.  He is no longer feeling depressed.  He is back at work and back living with his wife.  He is  still drinking alcohol on weekends but has worked hard to cut down.  He is struggling with erectile dysfunction.  Review of Systems: Review of Systems  Constitutional: Negative for chills, diaphoresis, fever, malaise/fatigue and weight loss.  HENT: Negative for sore throat.   Respiratory: Negative for cough, sputum production and shortness of breath.   Cardiovascular: Negative for chest pain.  Gastrointestinal: Negative for abdominal pain, diarrhea, heartburn, nausea and vomiting.  Genitourinary: Negative for dysuria and frequency.  Musculoskeletal: Negative for joint pain and myalgias.  Skin: Negative for rash.  Neurological: Negative for dizziness and headaches.  Psychiatric/Behavioral: Positive for substance abuse. Negative for depression. The patient is not nervous/anxious.     Past Medical History:  Diagnosis Date  . Depression   . HIV disease (HCC)   . Substance abuse (HCC)     Social History   Tobacco Use  . Smoking status: Never Smoker  . Smokeless tobacco: Never Used  Substance Use Topics  . Alcohol use: Yes    Alcohol/week: 7.2 oz    Types: 12 Standard drinks or equivalent per week    Comment: weekends  . Drug use: No    Family History  Problem Relation Age of Onset  . Hypertension Father     No Known Allergies  Health Maintenance  Topic Date Due  . INFLUENZA VACCINE  03/30/2018  . TETANUS/TDAP  07/18/2021  . HIV Screening  Completed    Objective:  Vitals:   03/28/18 1043  BP: (!) 147/101  Pulse: 76  Temp: 98.4 F (36.9 C)  TempSrc: Oral  Weight: 164 lb (74.4 kg)   Body mass index is 25.69 kg/m.  Physical Exam  Constitutional: He is oriented to person, place, and time.  He is in much better spirits today.  HENT:  Mouth/Throat: No oropharyngeal exudate.  Eyes: Conjunctivae are normal.  Cardiovascular: Normal rate, regular rhythm and normal heart sounds.  No murmur heard. Pulmonary/Chest: Effort normal and breath sounds normal.    Abdominal: Soft. He exhibits no mass. There is no tenderness.  Musculoskeletal: Normal range of motion.  Neurological: He is alert and oriented to person, place, and time.  Skin: No rash noted.  Psychiatric: He has a normal mood and affect.    Lab Results Lab Results  Component Value Date   WBC 6.9 12/07/2017   HGB 14.1 12/07/2017   HCT 41.3 12/07/2017   MCV 86.9 12/07/2017   PLT 291 12/07/2017    Lab Results  Component Value Date   CREATININE 1.04 12/07/2017   BUN 10 12/07/2017   NA 135 12/07/2017   K 3.5 12/07/2017   CL 95 (L) 12/07/2017   CO2 27 12/07/2017    Lab Results  Component Value Date   ALT 14 12/07/2017   AST 17 12/07/2017   ALKPHOS 62 09/29/2016   BILITOT 0.6 12/07/2017    Lab Results  Component Value Date   CHOL 199 12/07/2017   HDL 50 12/07/2017   LDLCALC 121 (H) 12/07/2017   TRIG 166 (H) 12/07/2017   CHOLHDL 4.0 12/07/2017   Lab Results  Component Value Date   LABRPR NON-REACTIVE 12/07/2017   HIV 1 RNA Quant (copies/mL)  Date Value  12/07/2017 180 (H)  05/25/2017 <20 NOT DETECTED  09/26/2016 590   CD4 T Cell Abs (/uL)  Date Value  12/07/2017 300 (L)  05/25/2017 390 (L)  09/26/2016 510     Problem List Items Addressed This Visit      High   Human immunodeficiency virus (HIV) disease (HCC)    I suspect that he has intermittent problems with adherence related to his alcohol consumption.  His viral load was up to 180 in April.  He will continue his current regimen and get repeat lab work today.  I have encouraged him to do his best to not miss a single dose of his medications.  He will follow-up in 6 months.      Relevant Orders   T-helper cell (CD4)- ([facility] clinic only)   HIV 1 RNA quant-no reflex-bld     Unprioritized   ABUSE, ALCOHOL, CONTINUOUS    He is having significant problems with relationships and work related to his alcohol use.  He is currently not interested in seeking help or counseling for this problem.  I encouraged  him to do his best to cut down and even quit.  He does agree that it has caused problems with his wife.      Depression    His depression is in remission.           Cliffton Asters, MD [facility] [phone] pager   [phone] cell 03/28/2018, 11:14 AM

## 2018-03-28 NOTE — Assessment & Plan Note (Signed)
He is having significant problems with relationships and work related to his alcohol use.  He is currently not interested in seeking help or counseling for this problem.  I encouraged him to do his best to cut down and even quit.  He does agree that it has caused problems with his wife.

## 2018-03-28 NOTE — Assessment & Plan Note (Signed)
I suspect that he has intermittent problems with adherence related to his alcohol consumption.  His viral load was up to 180 in April.  He will continue his current regimen and get repeat lab work today.  I have encouraged him to do his best to not miss a single dose of his medications.  He will follow-up in 6 months.

## 2018-03-28 NOTE — Addendum Note (Signed)
Addended by: Aggie CosierKUPPELWEISER, CASSIE L on: 03/28/2018 12:10 PM   Modules accepted: Orders

## 2018-03-28 NOTE — Assessment & Plan Note (Signed)
His depression is in remission. 

## 2018-03-29 LAB — T-HELPER CELL (CD4) - (RCID CLINIC ONLY)
CD4 % Helper T Cell: 17 % — ABNORMAL LOW (ref 33–55)
CD4 T Cell Abs: 630 /uL (ref 400–2700)

## 2018-03-31 LAB — HIV-1 RNA QUANT-NO REFLEX-BLD
HIV 1 RNA QUANT: 227 {copies}/mL — AB
HIV-1 RNA Quant, Log: 2.36 Log copies/mL — ABNORMAL HIGH

## 2018-05-29 ENCOUNTER — Telehealth: Payer: Self-pay | Admitting: *Deleted

## 2018-05-29 NOTE — Telephone Encounter (Signed)
Left message asking if patient ever brought in pay stubs for recertification, asking him to please go ahead and fill medication today. He is often detectable, may need a referral to [facility] for assessment for barriers to care. Andree Coss, RN

## 2018-06-23 ENCOUNTER — Ambulatory Visit: Payer: Self-pay

## 2018-07-18 ENCOUNTER — Encounter: Payer: Self-pay | Admitting: Internal Medicine

## 2018-07-21 ENCOUNTER — Telehealth: Payer: Self-pay

## 2018-07-21 ENCOUNTER — Other Ambulatory Visit: Payer: Self-pay | Admitting: Internal Medicine

## 2018-07-21 DIAGNOSIS — B20 Human immunodeficiency virus [HIV] disease: Secondary | ICD-10-CM

## 2018-07-21 DIAGNOSIS — N529 Male erectile dysfunction, unspecified: Secondary | ICD-10-CM

## 2018-07-21 NOTE — Telephone Encounter (Signed)
Pharmacy calling today requesting medication assistance for patient's prezcobix. Pharmacy unable to fill medication until this is complete. Pharmacy also needs new prescription for Prezcobix/ Biktarvy. Will route message to Jeannette HowBetty Howard, Pharmacy Tech to assist patient with advancing access for Prezcobix. Lorenso CourierJose L Maldonado, [place]

## 2018-07-24 ENCOUNTER — Telehealth: Payer: Self-pay | Admitting: *Deleted

## 2018-07-24 NOTE — Telephone Encounter (Signed)
I am willing to give him one refill but he would have to come in and pick up a prescription after he reduce his ADAP and meets with pharmacy.

## 2018-07-24 NOTE — Telephone Encounter (Signed)
Patient requesting refill of cialis. Please advise, as he has not been adherent to medications, ADAP renewal, or appointments. Andree CossHowell, Basil Blakesley M, RN

## 2018-07-25 ENCOUNTER — Telehealth: Payer: Self-pay

## 2018-07-25 ENCOUNTER — Telehealth: Payer: Self-pay | Admitting: *Deleted

## 2018-07-25 NOTE — Telephone Encounter (Signed)
Patient called from work in [place] with shift supervisor Trey PaulaJeff present. Patient has been feeling light headed with persistent headache, called for help from supervisor. Trey PaulaJeff asked Darryl Berg's permission to speak with this nurse, relayed that a First Responder on site has been monitoring Darryl Berg's blood pressure for last hour - has been persistently high (180's/110's), pulse in 80's.  They are asking for advice.  RN stated this office is patient's specialty care, does not have the appropriate medication to treat hypertension.  Trey PaulaJeff does not feel comfortable letting patient drive at this time. RN advised that patient be seen and treated today at urgent care or emergency room. Trey PaulaJeff relayed to Darryl Berg, stated that Darryl Berg would not be allowed to drive himself from work due to liability issues.  Darryl Berg was discussing his options with Trey PaulaJeff and disconnected the call to this RN. Andree CossHowell, Michelle M, RN

## 2018-07-25 NOTE — Telephone Encounter (Signed)
Patient left a voicemail requesting a call back. Attempted to reach out to patient regarding his call. Unable to speak with patient at this time. Left voicemail asking for a call back. Lorenso CourierJose L Maldonado, [place]

## 2018-07-25 NOTE — Telephone Encounter (Signed)
I have [facility] in the [facility]

## 2018-08-03 ENCOUNTER — Telehealth: Payer: Self-pay | Admitting: Behavioral Health

## 2018-08-03 NOTE — Telephone Encounter (Signed)
Patient called requesting a refill for Cialis.  Informed him Dr. Orvan Falconerampbell would be notified to make sure it is ok to refill.  Patient is overdue for an office visit.  Patient scheduled a upcoming lab and office visit appointment.  [facility] [address]. Angeline SlimAshley Jerni Selmer RN

## 2018-08-04 ENCOUNTER — Other Ambulatory Visit: Payer: Self-pay | Admitting: Internal Medicine

## 2018-08-04 ENCOUNTER — Other Ambulatory Visit: Payer: Self-pay | Admitting: Behavioral Health

## 2018-08-04 ENCOUNTER — Other Ambulatory Visit: Payer: Self-pay

## 2018-08-04 DIAGNOSIS — N529 Male erectile dysfunction, unspecified: Secondary | ICD-10-CM

## 2018-08-04 DIAGNOSIS — B20 Human immunodeficiency virus [HIV] disease: Secondary | ICD-10-CM

## 2018-08-04 MED ORDER — TADALAFIL 5 MG PO TABS: 5.0000 mg | ORAL_TABLET | Freq: Every day | ORAL | 0 refills | Status: DC | PRN

## 2018-08-04 NOTE — Telephone Encounter (Signed)
I sent one refill.  Thanks.

## 2018-08-15 ENCOUNTER — Other Ambulatory Visit: Payer: Self-pay | Admitting: Internal Medicine

## 2018-08-15 DIAGNOSIS — I10 Essential (primary) hypertension: Secondary | ICD-10-CM

## 2018-08-16 ENCOUNTER — Ambulatory Visit: Payer: Self-pay | Admitting: Internal Medicine

## 2018-09-11 ENCOUNTER — Other Ambulatory Visit: Payer: Self-pay

## 2018-09-11 ENCOUNTER — Other Ambulatory Visit: Payer: Self-pay | Admitting: Internal Medicine

## 2018-09-11 DIAGNOSIS — N529 Male erectile dysfunction, unspecified: Secondary | ICD-10-CM

## 2018-09-11 DIAGNOSIS — B20 Human immunodeficiency virus [HIV] disease: Secondary | ICD-10-CM

## 2018-09-11 MED ORDER — TADALAFIL 5 MG PO TABS: 5.0000 mg | ORAL_TABLET | Freq: Every day | ORAL | 3 refills | Status: DC | PRN

## 2018-09-11 NOTE — Telephone Encounter (Signed)
Medication refill sent electronically with 3 refills per Dr. Orvan Falconer. Patient made aware via MyChart.

## 2018-09-11 NOTE — Telephone Encounter (Signed)
Patient in clinic today for lab visit. Patient requesting refill for Cialis 5mg  daily as needed. Routing message to Dr. Orvan Falconer for advise.

## 2018-09-11 NOTE — Telephone Encounter (Signed)
He may have 3 refills.

## 2018-09-12 ENCOUNTER — Other Ambulatory Visit: Payer: Self-pay | Admitting: Internal Medicine

## 2018-09-12 DIAGNOSIS — I10 Essential (primary) hypertension: Secondary | ICD-10-CM

## 2018-09-12 LAB — T-HELPER CELL (CD4) - (RCID CLINIC ONLY)
CD4 % Helper T Cell: 20 % — ABNORMAL LOW (ref 33–55)
CD4 T Cell Abs: 560 /uL (ref 400–2700)

## 2018-09-13 LAB — COMPLETE METABOLIC PANEL WITH GFR
AG RATIO: 1.2 (calc) (ref 1.0–2.5)
ALT: 28 U/L (ref 9–46)
AST: 33 U/L (ref 10–40)
Albumin: 4.2 g/dL (ref 3.6–5.1)
Alkaline phosphatase (APISO): 49 U/L (ref 40–115)
BUN: 14 mg/dL (ref 7–25)
CO2: 25 mmol/L (ref 20–32)
Calcium: 9.1 mg/dL (ref 8.6–10.3)
Chloride: 106 mmol/L (ref 98–110)
Creat: 1.06 mg/dL (ref 0.60–1.35)
GFR, Est African American: 99 mL/min/{1.73_m2} (ref >=60)
GFR, Est Non African American: 86 mL/min/{1.73_m2} (ref >=60)
Globulin: 3.4 g/dL (calc) (ref 1.9–3.7)
Glucose, Bld: 88 mg/dL (ref 65–99)
Potassium: 4.5 mmol/L (ref 3.5–5.3)
Sodium: 142 mmol/L (ref 135–146)
Total Bilirubin: 0.4 mg/dL (ref 0.2–1.2)
Total Protein: 7.6 g/dL (ref 6.1–8.1)

## 2018-09-13 LAB — CBC WITH DIFFERENTIAL/PLATELET
Absolute Monocytes: 793 cells/uL (ref 200–950)
BASOS PCT: 1 %
Basophils Absolute: 61 cells/uL (ref 0–200)
Eosinophils Absolute: 31 cells/uL (ref 15–500)
Eosinophils Relative: 0.5 %
HCT: 41.3 % (ref 38.5–50.0)
Hemoglobin: 14.9 g/dL (ref 13.2–17.1)
Lymphs Abs: 2635 cells/uL (ref 850–3900)
MCH: 35.5 pg — ABNORMAL HIGH (ref 27.0–33.0)
MCHC: 36.1 g/dL — ABNORMAL HIGH (ref 32.0–36.0)
MCV: 98.3 fL (ref 80.0–100.0)
MPV: 9.6 fL (ref 7.5–12.5)
Monocytes Relative: 13 %
Neutro Abs: 2580 cells/uL (ref 1500–7800)
Neutrophils Relative %: 42.3 %
Platelets: 223 10*3/uL (ref 140–400)
RBC: 4.2 10*6/uL (ref 4.20–5.80)
RDW: 13.3 % (ref 11.0–15.0)
Total Lymphocyte: 43.2 %
WBC: 6.1 10*3/uL (ref 3.8–10.8)

## 2018-09-13 LAB — HIV-1 RNA QUANT-NO REFLEX-BLD
HIV 1 RNA Quant: 105 copies/mL — ABNORMAL HIGH
HIV-1 RNA Quant, Log: 2.02 Log copies/mL — ABNORMAL HIGH

## 2018-09-18 ENCOUNTER — Other Ambulatory Visit: Payer: Self-pay | Admitting: Internal Medicine

## 2018-09-18 DIAGNOSIS — N529 Male erectile dysfunction, unspecified: Secondary | ICD-10-CM

## 2018-09-26 ENCOUNTER — Encounter: Payer: Self-pay | Admitting: Internal Medicine

## 2018-09-27 ENCOUNTER — Other Ambulatory Visit: Payer: Self-pay | Admitting: Behavioral Health

## 2018-09-27 DIAGNOSIS — N529 Male erectile dysfunction, unspecified: Secondary | ICD-10-CM

## 2018-10-13 ENCOUNTER — Other Ambulatory Visit: Payer: Self-pay | Admitting: Internal Medicine

## 2018-10-13 DIAGNOSIS — I10 Essential (primary) hypertension: Secondary | ICD-10-CM

## 2018-10-18 ENCOUNTER — Encounter: Payer: Self-pay | Admitting: Internal Medicine

## 2018-10-18 ENCOUNTER — Ambulatory Visit (INDEPENDENT_AMBULATORY_CARE_PROVIDER_SITE_OTHER): Payer: Self-pay | Admitting: Internal Medicine

## 2018-10-18 DIAGNOSIS — F330 Major depressive disorder, recurrent, mild: Secondary | ICD-10-CM

## 2018-10-18 DIAGNOSIS — F1010 Alcohol abuse, uncomplicated: Secondary | ICD-10-CM

## 2018-10-18 DIAGNOSIS — B20 Human immunodeficiency virus [HIV] disease: Secondary | ICD-10-CM

## 2018-10-18 NOTE — Assessment & Plan Note (Signed)
He admits that alcohol has been causing problems with his relationships.  He did meet with our behavioral health counselor today.

## 2018-10-18 NOTE — Assessment & Plan Note (Signed)
It seems that his adherence is better recently.  His viral load is down and his CD4 has returned to normal.  He is concerned that his virus is still detectable.  I told him that because of poor adherence in the past he has a virus that has already developed some resistance.  I told him that this makes it even more important that he not miss doses going forward.  He will follow-up after lab work in 6 months.

## 2018-10-18 NOTE — Assessment & Plan Note (Signed)
He may have a slight amount of insight into the relationship between his alcohol dependence and depression.  I asked him to meet with our behavioral health counselor, Angus Palms, today.

## 2018-10-18 NOTE — Progress Notes (Signed)
Patient Active Problem List   Diagnosis Date Noted  . Essential hypertension 07/01/2009    Priority: High  . Human immunodeficiency virus (HIV) disease (HCC) 10/17/2006    Priority: High  . Acute renal insufficiency 09/29/2016  . Gastritis due to alcohol without hemorrhage 09/26/2016  . Alcoholic hepatitis 09/26/2016  . Asymptomatic cholelithiasis 09/26/2016  . Transaminitis 09/25/2016  . 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 ANXIETY 10/17/2006  . ABUSE, ALCOHOL, CONTINUOUS 10/17/2006  . DISORDER, TOBACCO USE 10/17/2006  . Depression 10/17/2006    Patient's Medications  New Prescriptions   No medications on file  Previous Medications   ACETAMINOPHEN (TYLENOL) 325 MG TABLET    Take 650 mg by mouth every 6 (six) hours as needed for mild pain or fever.   BICTEGRAVIR-EMTRICITABINE-TENOFOVIR AF (BIKTARVY) 50-200-25 MG TABS TABLET    Take 1 tablet by mouth daily.   BIKTARVY 50-200-25 MG TABS TABLET    TAKE 1 TABLET BY MOUTH DAILY   HYDROCHLOROTHIAZIDE (HYDRODIURIL) 50 MG TABLET    TAKE 1 TABLET BY MOUTH DAILY   OMEPRAZOLE (PRILOSEC) 20 MG CAPSULE    Take 1 capsule (20 mg total) by mouth daily.   PREZCOBIX 800-150 MG TABLET    TAKE 1 TABLET BY MOUTH DAILY WITH BREAKFAST. SWALLOW WHOLE. DO NOT CURSH, BREAK OR CHEW TABLETS. TAKE WITH FOOD   TADALAFIL (CIALIS) 5 MG TABLET    Take 1 tablet (5 mg total) by mouth daily as needed for erectile dysfunction.  Modified Medications   No medications on file  Discontinued Medications   No medications on file    Subjective: Darryl Berg is in for his routine follow-up visit.  He has not had any problems obtaining, taking or tolerating his Biktarvy and Prezcobix since his ADAP was renewed last November.  He says that he has a new insurance program through our clinic that covers him for 1 full year.  He takes his medication each morning when he first gets up.  He says that he is drinking alcohol less  frequently than he was last year.  He is now back home with his family part-time but has [place] an apartment [place].  He stays at the apartment when he is drinking alcohol.  He admits that he missed his scheduled appointment here recently because he had been drinking and forgot what day it was.  He says that he does not feel depressed when he is not drinking.  He tells me that he has had some program at [facility] the [place] but only goes there once each month.  Review of Systems: Review of Systems  Constitutional: Negative for chills, diaphoresis, fever, malaise/fatigue and weight loss.  HENT: Negative for sore throat.   Respiratory: Negative for cough, sputum production and shortness of breath.   Cardiovascular: Negative for chest pain.  Gastrointestinal: Negative for abdominal pain, diarrhea, heartburn, nausea and vomiting.  Genitourinary: Negative for dysuria and frequency.  Musculoskeletal: Negative for joint pain and myalgias.  Skin: Negative for rash.  Neurological: Negative for dizziness and headaches.  Psychiatric/Behavioral: Positive for depression and substance abuse. The patient is not nervous/anxious.     Past Medical History:  Diagnosis Date  . Depression   . HIV disease (HCC)   . Substance abuse (HCC)     Social History   Tobacco Use  . Smoking status: Never Smoker  . Smokeless tobacco: Never Used  Substance Use Topics  .  Alcohol use: Yes    Alcohol/week: 12.0 standard drinks    Types: 12 Standard drinks or equivalent per week    Comment: weekends  . Drug use: No    Family History  Problem Relation Age of Onset  . Hypertension Father     No Known Allergies  Health Maintenance  Topic Date Due  . INFLUENZA VACCINE  03/30/2018  . TETANUS/TDAP  07/18/2021  . HIV Screening  Completed    Objective:  Vitals:   10/18/18 1019  BP: (!) 140/103  Pulse: 77  Temp: 98.7 F (37.1 C)  TempSrc: Oral  Weight: 168 lb 8 oz (76.4 kg)  Height: 5\' 6"   (1.676 m)   Body mass index is 27.2 kg/m.  Physical Exam Constitutional:      Comments: He is in good spirits.  HENT:     Mouth/Throat:     Pharynx: No oropharyngeal exudate.  Eyes:     Conjunctiva/sclera: Conjunctivae normal.  Cardiovascular:     Rate and Rhythm: Normal rate and regular rhythm.     Heart sounds: No murmur.  Pulmonary:     Effort: Pulmonary effort is normal.     Breath sounds: Normal breath sounds.  Abdominal:     Palpations: Abdomen is soft. There is no mass.     Tenderness: There is no abdominal tenderness.  Musculoskeletal: Normal range of motion.  Skin:    Findings: No rash.  Neurological:     Mental Status: He is alert and oriented to person, place, and time.  Psychiatric:        Mood and Affect: Mood normal.     Lab Results Lab Results  Component Value Date   WBC 6.1 09/11/2018   HGB 14.9 09/11/2018   HCT 41.3 09/11/2018   MCV 98.3 09/11/2018   PLT 223 09/11/2018    Lab Results  Component Value Date   CREATININE 1.06 09/11/2018   BUN 14 09/11/2018   NA 142 09/11/2018   K 4.5 09/11/2018   CL 106 09/11/2018   CO2 25 09/11/2018    Lab Results  Component Value Date   ALT 28 09/11/2018   AST 33 09/11/2018   ALKPHOS 62 09/29/2016   BILITOT 0.4 09/11/2018    Lab Results  Component Value Date   CHOL 199 12/07/2017   HDL 50 12/07/2017   LDLCALC 121 (H) 12/07/2017   TRIG 166 (H) 12/07/2017   CHOLHDL 4.0 12/07/2017   Lab Results  Component Value Date   LABRPR NON-REACTIVE 12/07/2017   HIV 1 RNA Quant (copies/mL)  Date Value  09/11/2018 105 (H)  03/28/2018 227 (H)  12/07/2017 180 (H)   CD4 T Cell Abs (/uL)  Date Value  09/11/2018 560  03/28/2018 630  12/07/2017 300 (L)     Problem List Items Addressed This Visit      High   Human immunodeficiency virus (HIV) disease (HCC)    It seems that his adherence is better recently.  His viral load is down and his CD4 has returned to normal.  He is concerned that his virus is still  detectable.  I told him that because of poor adherence in the past he has a virus that has already developed some resistance.  I told him that this makes it even more important that he not miss doses going forward.  He will follow-up after lab work in 6 months.      Relevant Orders   T-helper cell (CD4)- ([facility] clinic only)   HIV-1  RNA quant-no reflex-bld   CBC   Comprehensive metabolic panel   Lipid panel   RPR     Unprioritized   Depression    He may have a slight amount of insight into the relationship between his alcohol dependence and depression.  I asked him to meet with our behavioral health counselor, Darryl Berg, today.      ABUSE, ALCOHOL, CONTINUOUS    He admits that alcohol has been causing problems with his relationships.  He did meet with our behavioral health counselor today.           Darryl AstersJohn Alzena Gerber, MD [facility] [phone] pager   [phone] cell 10/18/2018, 10:37 AM

## 2018-10-24 ENCOUNTER — Telehealth: Payer: Self-pay

## 2018-10-24 NOTE — Telephone Encounter (Signed)
Received a call from [facility] speciality stating that multiple attempts have been made to patient regarding medication delivery. Pharmacy unable to speak with patient directly at this time, will have patient medication on hold until patient returns call. Left message with patient requesting that he call pharmacy with delivery date/time to continue medication as prescribed. Left pharmacy number in voicemail for patient. Lorenso Courier, [place]

## 2018-12-06 ENCOUNTER — Encounter: Payer: Self-pay | Admitting: Internal Medicine

## 2018-12-06 ENCOUNTER — Other Ambulatory Visit: Payer: Self-pay | Admitting: Internal Medicine

## 2018-12-06 DIAGNOSIS — I10 Essential (primary) hypertension: Secondary | ICD-10-CM

## 2018-12-06 DIAGNOSIS — B20 Human immunodeficiency virus [HIV] disease: Secondary | ICD-10-CM

## 2019-01-25 ENCOUNTER — Other Ambulatory Visit: Payer: Self-pay | Admitting: Internal Medicine

## 2019-01-25 DIAGNOSIS — I10 Essential (primary) hypertension: Secondary | ICD-10-CM

## 2019-03-09 ENCOUNTER — Other Ambulatory Visit: Payer: Self-pay | Admitting: Internal Medicine

## 2019-03-09 DIAGNOSIS — N529 Male erectile dysfunction, unspecified: Secondary | ICD-10-CM

## 2019-04-18 ENCOUNTER — Other Ambulatory Visit: Payer: Self-pay

## 2019-05-14 ENCOUNTER — Encounter: Payer: Self-pay | Admitting: Internal Medicine

## 2019-05-15 ENCOUNTER — Other Ambulatory Visit: Payer: Self-pay | Admitting: Internal Medicine

## 2019-05-15 DIAGNOSIS — N529 Male erectile dysfunction, unspecified: Secondary | ICD-10-CM

## 2019-05-16 ENCOUNTER — Telehealth: Payer: Self-pay

## 2019-05-16 NOTE — Telephone Encounter (Signed)
Attempted to call patient to schedule an apportionment for a office visit. Patient last seen in 09/2018, was able to get a hold of patient, but call was dropped before appointment could be made.  [place]

## 2019-05-24 ENCOUNTER — Ambulatory Visit: Payer: Self-pay

## 2019-06-03 ENCOUNTER — Other Ambulatory Visit: Payer: Self-pay | Admitting: Internal Medicine

## 2019-06-03 DIAGNOSIS — N529 Male erectile dysfunction, unspecified: Secondary | ICD-10-CM

## 2019-06-06 ENCOUNTER — Encounter: Payer: Self-pay | Admitting: Internal Medicine

## 2019-06-06 ENCOUNTER — Other Ambulatory Visit: Payer: Self-pay | Admitting: *Deleted

## 2019-06-06 ENCOUNTER — Telehealth: Payer: Self-pay | Admitting: Pharmacy Technician

## 2019-06-06 DIAGNOSIS — B20 Human immunodeficiency virus [HIV] disease: Secondary | ICD-10-CM

## 2019-06-06 MED ORDER — BIKTARVY 50-200-25 MG PO TABS: 1.0000 | ORAL_TABLET | Freq: Every day | ORAL | 1 refills | Status: DC

## 2019-06-06 MED ORDER — PREZCOBIX 800-150 MG PO TABS: ORAL_TABLET | ORAL | 1 refills | Status: DC

## 2019-06-06 NOTE — Telephone Encounter (Signed)
[facility] Patient Advocate Encounter  Completed and sent [employer] Advancing Access application for Biktarvy and Prezcobix for this patient who is uninsured.    He is approved for a 30 day fill of Biktarvy to bridge until HMAP is approved. ID:  [id number] B  [id number] P  [id number] G  [id number]  He is approved through 08/13/2019 for Prezcobix. ID:  [id number]  [id number] P  [id number] G  [id number]  This information has been given to [facility] pharmacy and the patient is aware and will call them to set up delivery.  He is aware he has two fills of each medication and must make and keep an office visit with Dr. Megan Salon for more fills.  Venida Jarvis. Nadara Mustard Christine Patient [facility] for Infectious Disease Phone: [phone] Fax:  [phone]

## 2019-06-13 ENCOUNTER — Other Ambulatory Visit: Payer: Self-pay

## 2019-06-24 ENCOUNTER — Other Ambulatory Visit: Payer: Self-pay | Admitting: Internal Medicine

## 2019-06-24 DIAGNOSIS — N529 Male erectile dysfunction, unspecified: Secondary | ICD-10-CM

## 2019-06-25 ENCOUNTER — Other Ambulatory Visit: Payer: Self-pay

## 2019-06-25 DIAGNOSIS — B20 Human immunodeficiency virus [HIV] disease: Secondary | ICD-10-CM

## 2019-06-26 LAB — T-HELPER CELL (CD4) - (RCID CLINIC ONLY)
CD4 % Helper T Cell: 19 % — ABNORMAL LOW (ref 33–65)
CD4 T Cell Abs: 444 /uL (ref 400–1790)

## 2019-06-28 ENCOUNTER — Encounter: Payer: Self-pay | Admitting: Internal Medicine

## 2019-06-28 ENCOUNTER — Other Ambulatory Visit: Payer: Self-pay | Admitting: Internal Medicine

## 2019-06-28 DIAGNOSIS — N529 Male erectile dysfunction, unspecified: Secondary | ICD-10-CM

## 2019-06-28 LAB — COMPREHENSIVE METABOLIC PANEL
AG Ratio: 1.3 (calc) (ref 1.0–2.5)
ALT: 29 U/L (ref 9–46)
AST: 37 U/L (ref 10–40)
Albumin: 4.5 g/dL (ref 3.6–5.1)
Alkaline phosphatase (APISO): 48 U/L (ref 36–130)
BUN: 13 mg/dL (ref 7–25)
CO2: 25 mmol/L (ref 20–32)
Calcium: 9.2 mg/dL (ref 8.6–10.3)
Chloride: 101 mmol/L (ref 98–110)
Creat: 0.97 mg/dL (ref 0.60–1.35)
Globulin: 3.4 g/dL (calc) (ref 1.9–3.7)
Glucose, Bld: 107 mg/dL — ABNORMAL HIGH (ref 65–99)
Potassium: 4.3 mmol/L (ref 3.5–5.3)
Sodium: 140 mmol/L (ref 135–146)
Total Bilirubin: 0.5 mg/dL (ref 0.2–1.2)
Total Protein: 7.9 g/dL (ref 6.1–8.1)

## 2019-06-28 LAB — LIPID PANEL
Cholesterol: 207 mg/dL — ABNORMAL HIGH (ref <200)
HDL: 84 mg/dL (ref >=40)
LDL Cholesterol (Calc): 90 mg/dL (calc)
Non-HDL Cholesterol (Calc): 123 mg/dL (calc) (ref <130)
Total CHOL/HDL Ratio: 2.5 (calc) (ref <5.0)
Triglycerides: 248 mg/dL — ABNORMAL HIGH (ref <150)

## 2019-06-28 LAB — CBC
HCT: 45.7 % (ref 38.5–50.0)
Hemoglobin: 15 g/dL (ref 13.2–17.1)
MCH: 31.6 pg (ref 27.0–33.0)
MCHC: 32.8 g/dL (ref 32.0–36.0)
MCV: 96.2 fL (ref 80.0–100.0)
MPV: 10.5 fL (ref 7.5–12.5)
Platelets: 201 10*3/uL (ref 140–400)
RBC: 4.75 10*6/uL (ref 4.20–5.80)
RDW: 13.5 % (ref 11.0–15.0)
WBC: 5.8 10*3/uL (ref 3.8–10.8)

## 2019-06-28 LAB — HIV-1 RNA QUANT-NO REFLEX-BLD
HIV 1 RNA Quant: 80 copies/mL — ABNORMAL HIGH
HIV-1 RNA Quant, Log: 1.9 Log copies/mL — ABNORMAL HIGH

## 2019-06-28 LAB — RPR: RPR Ser Ql: NONREACTIVE

## 2019-06-29 ENCOUNTER — Other Ambulatory Visit: Payer: Self-pay | Admitting: Internal Medicine

## 2019-06-29 DIAGNOSIS — N529 Male erectile dysfunction, unspecified: Secondary | ICD-10-CM

## 2019-07-04 ENCOUNTER — Ambulatory Visit: Payer: Self-pay | Admitting: Internal Medicine

## 2019-07-12 ENCOUNTER — Ambulatory Visit (INDEPENDENT_AMBULATORY_CARE_PROVIDER_SITE_OTHER): Payer: Self-pay | Admitting: Pharmacist

## 2019-07-12 ENCOUNTER — Other Ambulatory Visit: Payer: Self-pay

## 2019-07-12 DIAGNOSIS — N529 Male erectile dysfunction, unspecified: Secondary | ICD-10-CM

## 2019-07-12 DIAGNOSIS — Z23 Encounter for immunization: Secondary | ICD-10-CM

## 2019-07-12 DIAGNOSIS — B20 Human immunodeficiency virus [HIV] disease: Secondary | ICD-10-CM

## 2019-07-12 MED ORDER — TADALAFIL 5 MG PO TABS: 5.0000 mg | ORAL_TABLET | Freq: Every day | ORAL | 0 refills | Status: DC | PRN

## 2019-07-12 NOTE — Progress Notes (Signed)
HPI: Darryl Berg is a 43 y.o. male who presents to the [place] clinic for HIV follow-up.  Patient Active Problem List   Diagnosis Date Noted  . Acute renal insufficiency 09/29/2016  . Gastritis due to alcohol without hemorrhage 09/26/2016  . Alcoholic hepatitis 16/02/3709  . Asymptomatic cholelithiasis 09/26/2016  . Transaminitis 09/25/2016  . Essential hypertension 07/01/2009  . 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 Human immunodeficiency virus (HIV) disease ([place]) 10/17/2006  . ANXIETY 10/17/2006  . ABUSE, ALCOHOL, CONTINUOUS 10/17/2006  . DISORDER, TOBACCO USE 10/17/2006  . Depression 10/17/2006    Patient's Medications  New Prescriptions   No medications on file  Previous Medications   ACETAMINOPHEN (TYLENOL) 325 MG TABLET    Take 650 mg by mouth every 6 (six) hours as needed for mild pain or fever.   BICTEGRAVIR-EMTRICITABINE-TENOFOVIR AF (BIKTARVY) 50-200-25 MG TABS TABLET    Take 1 tablet by mouth daily.   BIKTARVY 50-200-25 MG TABS TABLET    TAKE 1 TABLET BY MOUTH DAILY   DARUNAVIR-COBICISTAT (PREZCOBIX) 800-150 MG TABLET    TAKE 1 TABLET BY MOUTH DAILY WITH BREAKFAST. SWALLOW WHOLE. DO NOT CURSH, BREAK OR CHEW TABLETS. TAKE WITH FOOD   HYDROCHLOROTHIAZIDE (HYDRODIURIL) 50 MG TABLET    TAKE 1 TABLET BY MOUTH DAILY   OMEPRAZOLE (PRILOSEC) 20 MG CAPSULE    Take 1 capsule (20 mg total) by mouth daily.   TADALAFIL (CIALIS) 5 MG TABLET    TAKE 1 TABLET BY MOUTH ONCE DAILY AS NEEDED FOR ERECTILE DYSFUNCTION  Modified Medications   No medications on file  Discontinued Medications   No medications on file    Allergies: No Known Allergies  Past Medical History: Past Medical History:  Diagnosis Date  . Depression   . HIV disease ([place])   . Substance abuse ([place])     Social History: Social History   Socioeconomic History  . Marital status: Single    Spouse name: Not on file  . Number of children: Not on file   . Years of education: Not on file  . Highest education level: Not on file  Occupational History  . Not on file  Social Needs  . Financial resource strain: Not on file  . Food insecurity    Worry: Not on file    Inability: Not on file  . Transportation needs    Medical: Not on file    Non-medical: Not on file  Tobacco Use  . Smoking status: Never Smoker  . Smokeless tobacco: Never Used  Substance and Sexual Activity  . Alcohol use: Yes    Alcohol/week: 12.0 standard drinks    Types: 12 Standard drinks or equivalent per week    Comment: weekends  . Drug use: No  . Sexual activity: Yes    Partners: Male    Birth control/protection: Condom    Comment: accepted condoms  Lifestyle  . Physical activity    Days per week: Not on file    Minutes per session: Not on file  . Stress: Not on file  Relationships  . Social Herbalist on phone: Not on file    Gets together: Not on file    Attends religious service: Not on file    Active member of club or organization: Not on file    Attends meetings of clubs or organizations: Not on file    Relationship status: Not on file  Other Topics Concern  .  Not on file  Social History Narrative  . Not on file    Labs: Lab Results  Component Value Date   HIV1RNAQUANT 80 (H) 06/25/2019   HIV1RNAQUANT 105 (H) 09/11/2018   HIV1RNAQUANT 227 (H) 03/28/2018   CD4TABS 444 06/25/2019   CD4TABS 560 09/11/2018   CD4TABS 630 03/28/2018    RPR and STI Lab Results  Component Value Date   LABRPR NON-REACTIVE 06/25/2019   LABRPR NON-REACTIVE 12/07/2017   LABRPR NON-REACTIVE 05/25/2017   LABRPR NON REAC 11/06/2015   LABRPR NON REAC 03/27/2015    No flowsheet data found.  Hepatitis B Lab Results  Component Value Date   HEPBSAB NEG 09/29/2016   HEPBSAG Negative 09/25/2016   Hepatitis C No results found for: HEPCAB, HCVRNAPCRQN Hepatitis A No results found for: HAV Lipids: Lab Results  Component Value Date   CHOL 207 (H)  06/25/2019   TRIG 248 (H) 06/25/2019   HDL 84 06/25/2019   CHOLHDL 2.5 06/25/2019   VLDL 48 (H) 11/06/2015   LDLCALC 90 06/25/2019    Current HIV Regimen: Biktarvy and Prezcobix  Assessment: Darryl Berg is here for his follow-up HIV appointment after multiple no shows. He says he has trouble keeping his days straight sometimes and he will miss an appointment because of this. He was encouraged to write his appointments down on his phone to help him remember. He had questions regarding his omeprazole and whether or not he can take this medication with his HIV regimen. He was counseled that he can take omeprazole with Biktarvy and Prezcobix but he should take the omeprazole either 2 hours before or 4 hours after his HIV medications. He understood this and was happy to be able to take the omeprazole again because he does experience acid reflux after he eats. He has no issues tolerating his medications and he says he does not miss doses. The last time he remembers not taking his medication was for a week in October when he ran out of refills. He was counseled on the importance of adherence to help get his viral load undetectable. He is due for his hepatitis A and B vaccine but we will defer these to his follow up visit in 2 months and give him just his influenza vaccine today.    Plan: - Continue Biktarvy and Prezcobix - Influenza vaccine - Follow up 1/14 with Cassie   Jettie Pagan, PharmD PGY2 Infectious Disease Pharmacy Resident  [facility] 07/12/2019, 4:11 PM

## 2019-08-03 ENCOUNTER — Other Ambulatory Visit: Payer: Self-pay | Admitting: Internal Medicine

## 2019-08-03 DIAGNOSIS — B20 Human immunodeficiency virus [HIV] disease: Secondary | ICD-10-CM

## 2019-08-11 ENCOUNTER — Other Ambulatory Visit: Payer: Self-pay | Admitting: Pharmacist

## 2019-08-11 DIAGNOSIS — N529 Male erectile dysfunction, unspecified: Secondary | ICD-10-CM

## 2019-08-13 ENCOUNTER — Other Ambulatory Visit: Payer: Self-pay | Admitting: Pharmacist

## 2019-08-13 DIAGNOSIS — N529 Male erectile dysfunction, unspecified: Secondary | ICD-10-CM

## 2019-08-16 ENCOUNTER — Other Ambulatory Visit: Payer: Self-pay | Admitting: Pharmacist

## 2019-08-16 DIAGNOSIS — N529 Male erectile dysfunction, unspecified: Secondary | ICD-10-CM

## 2019-08-17 ENCOUNTER — Telehealth: Payer: MEDICAID | Admitting: Physician Assistant

## 2019-08-17 ENCOUNTER — Telehealth: Payer: Self-pay | Admitting: *Deleted

## 2019-08-17 DIAGNOSIS — Z20822 Contact with and (suspected) exposure to covid-19: Secondary | ICD-10-CM

## 2019-08-17 MED ORDER — PROMETHAZINE-DM 6.25-15 MG/5ML PO SYRP: 5.0000 mL | ORAL_SOLUTION | Freq: Four times a day (QID) | ORAL | 0 refills | Status: DC | PRN

## 2019-08-17 MED ORDER — ALBUTEROL SULFATE HFA 108 (90 BASE) MCG/ACT IN AERS: 2.0000 | INHALATION_SPRAY | RESPIRATORY_TRACT | 0 refills | Status: DC | PRN

## 2019-08-17 NOTE — Progress Notes (Signed)
Patient's chart reviewed.  He is HIV positive and has previously had poor compliance with his medications.  Last set of lab work in October 2020 showed decreased but measurable viral load and improving CD4 count at 444.  Will have testing and symptomatic therapy for this patient with strict return precautions.   E-Visit for Corona Virus Screening   Your current symptoms could be consistent with the coronavirus.  I am some concerned about your shortness of breath.  Your HIV puts you at risk for severe complications from Covid.  I am going to prescribe some symptomatic therapy including a cough medication and an inhaler.  If this does not improve your symptoms within 24 hours or if at any point you begin to worsen you will need to be seen in person either at an urgent care or an emergency department.  I strongly urge you to be seen in person if your shortness of breath worsens in any way.  Below is the information for outpatient testing.  I would strongly encourage you to get tested.   Many health care providers can now test patients at their office but not all are.  [facility] has multiple testing sites. For information on our COVID testing locations and hours go to [email or web address]  We are enrolling you in our MyChart Home Monitoring for COVID19 . Daily you will receive a questionnaire within the MyChart website. Our COVID 19 response team will be monitoring your responses daily.  Testing Information: The COVID-19 Community Testing sites will begin testing BY APPOINTMENT ONLY.  You can schedule online at [email or web address]  If you do not have access to a smart phone or computer you may call [phone] for an appointment.  Testing Locations: Appointment schedule is 8 am to 3:30 pm at all sites  [facility] indoors at [address], [place] [address] [facility]  indoors at [facility], [place], [place] [id number] [place] indoors at [address],  [place] [address]  Additional testing sites in the Community:  . For [facility] Testing sites in [place]  [place].com.auhttps://www.cvs.com/minuteclinic/covid-19-testing  . For Pop-up testing sites in [place]  https://morgan-vargas.com/https://covid19.ncdhhs.gov/about-covid-19/testing/find-my-testing-place/pop-testing-sites  . For Testing sites with regular hours https://onsms.org/[place]/  . For [facility] https://www.gonzalez.org/https://tapmedicine.com/covid-19-community-outreach-testing/  . For [facility] EternalVitamin.dkhttps://www.[place].gov/our-county/human-services/health-department/coronavirus-covid-19-info/covid-19-testing  . For [facility] testing in [place] EternalVitamin.dkhttps://www.[place]ountync.gov/our-county/human-services/health-department/coronavirus-covid-19-info/covid-19-testing  . For [facility] testing in [facility] [place]   https://lhi.care/covidtesting  For  more information about community testing call [phone]   We are enrolling you in our MyChart Home Monitoring for COVID19 . Daily you will receive a questionnaire within the MyChart website. Our COVID 19 response team will be monitoring your responses daily.  Please quarantine yourself while awaiting your test results. If you develop fever/cough/breathlessness, please stay home for 10 days with improving symptoms and until you have had 24 hours of no fever (without taking a fever reducer).  You should wear a mask or cloth face covering over your nose and mouth if you must be around other people or animals, including pets (even at home). Try to stay at least 6 feet away from other people. This will protect the people around you.  Please continue good preventive care measures, including:  frequent hand-washing, avoid touching your face, cover coughs/sneezes, stay out of crowds and keep a 6 foot distance from others.  COVID-19 is a respiratory illness with symptoms that are similar to the flu. Symptoms are typically mild to moderate, but  there have been cases of severe illness and death due to the virus.   The following symptoms may appear 2-14 days after exposure: . Fever . Cough . Shortness of breath or difficulty breathing . Chills . Repeated shaking with chills . Muscle pain . Headache . Sore throat . New loss of taste or smell . Fatigue . Congestion or runny nose . Nausea or vomiting . Diarrhea  Go to the nearest hospital ED for assessment if fever/cough/breathlessness are severe or illness seems like a threat to life.  It is vitally important that if you feel that you have an infection such as this virus or any other virus that you stay home and away from places where you may spread it to others.  You should avoid contact with people age 21 and older.   You can use medication such as A prescription inhaler called Albuterol MDI 90 mcg /actuation 2 puffs every 4 hours as needed for shortness of breath, wheezing, cough and A prescription cough medication called Phenergan DM 6.25 mg/15 mg. You make take one teaspoon / 5 ml every 4-6 hours as needed for cough  You may also take acetaminophen (Tylenol) as needed for fever.  Reduce your risk of any infection by using the same precautions used for avoiding the common cold or flu:  Marland Kitchen Wash your hands often with soap and warm water for at least 20 seconds.  If soap and water are not readily available, use an alcohol-based hand sanitizer with at least 60% alcohol.  . If coughing or sneezing, cover your mouth and nose by coughing or sneezing into the elbow areas of your shirt or coat, into a tissue or into your sleeve (not your hands). . Avoid shaking hands with others and consider head nods or verbal greetings only. . Avoid touching your eyes, nose, or mouth with unwashed hands.  . Avoid close contact with people who are sick. . Avoid places or events with large numbers of people in one location, like concerts or sporting events. . Carefully consider travel plans you have or  are making. . If you are planning any travel outside or inside the [place], visit the CDC's Travelers' Health webpage for the latest health notices. . If you have some symptoms but not all symptoms, continue to monitor at home and seek medical attention if your symptoms worsen. . If you are having a medical emergency, call 911.  HOME CARE . Only take medications as instructed by your medical team. . Drink plenty of fluids and get plenty of rest. . A steam or ultrasonic humidifier can help if you have congestion.   GET HELP RIGHT AWAY IF YOU HAVE EMERGENCY WARNING SIGNS** FOR COVID-19. If you or someone is showing any of these signs seek emergency medical care immediately. Call 911 or proceed to your closest emergency facility if: . You develop worsening high fever. . Trouble breathing . Bluish lips or face . Persistent pain or pressure in the chest . New confusion . Inability to wake or stay awake . You cough up blood. . Your symptoms become more severe  **This list is not all possible symptoms. Contact your medical provider for any symptoms that are sever or concerning to you.  MAKE SURE YOU   Understand these instructions.  Will watch your condition.  Will get help right away if you are not doing well or get worse.  Your e-visit answers were reviewed by a board certified advanced clinical practitioner to complete your personal  care plan.  Depending on the condition, your plan could have included both over the counter or prescription medications.  If there is a problem please reply once you have received a response from your provider.  Your safety is important to [place].  If you have drug allergies check your prescription carefully.    You can use MyChart to ask questions about today's visit, request a non-urgent call back, or ask for a work or school excuse for 24 hours related to this e-Visit. If it has been greater than 24 hours you will need to follow up with your provider, or enter a  new e-Visit to address those concerns. You will get an e-mail in the next two days asking about your experience.  I hope that your e-visit has been valuable and will speed your recovery. Thank you for using e-visits.   Greater than 5 minutes, yet less than 10 minutes of time have been spent researching, coordinating, and implementing care for this patient today

## 2019-08-17 NOTE — Telephone Encounter (Signed)
Patient called, thinks he has 11/12 signs of covid. He is not in any acute distress. He is asking for an appointment with a provider.  RN advised that this can be done in person through Urgent Care or via [place].  He will do the evisit via MyChart. Landis Gandy, RN

## 2019-08-21 ENCOUNTER — Other Ambulatory Visit: Payer: Self-pay

## 2019-09-13 ENCOUNTER — Ambulatory Visit: Payer: Self-pay | Admitting: Pharmacist

## 2019-09-19 ENCOUNTER — Ambulatory Visit: Payer: Self-pay

## 2019-09-19 ENCOUNTER — Ambulatory Visit (INDEPENDENT_AMBULATORY_CARE_PROVIDER_SITE_OTHER): Payer: Self-pay | Admitting: Pharmacist

## 2019-09-19 ENCOUNTER — Other Ambulatory Visit: Payer: Self-pay

## 2019-09-19 DIAGNOSIS — N529 Male erectile dysfunction, unspecified: Secondary | ICD-10-CM

## 2019-09-19 DIAGNOSIS — B20 Human immunodeficiency virus [HIV] disease: Secondary | ICD-10-CM

## 2019-09-19 MED ORDER — PREZCOBIX 800-150 MG PO TABS: 1.0000 | ORAL_TABLET | Freq: Every day | ORAL | 3 refills | Status: DC

## 2019-09-19 MED ORDER — TADALAFIL 10 MG PO TABS: 10.0000 mg | ORAL_TABLET | Freq: Every day | ORAL | 0 refills | Status: DC | PRN

## 2019-09-19 MED ORDER — BIKTARVY 50-200-25 MG PO TABS: 1.0000 | ORAL_TABLET | Freq: Every day | ORAL | 3 refills | Status: DC

## 2019-09-19 NOTE — Progress Notes (Signed)
HPI: Darryl Berg is a 44 y.o. male who presents to the [facility] for HIV follow-up.  Patient Active Problem List   Diagnosis Date Noted  . Acute renal insufficiency 09/29/2016  . Gastritis due to alcohol without hemorrhage 09/26/2016  . Alcoholic hepatitis 09/62/8366  . Asymptomatic cholelithiasis 09/26/2016  . Transaminitis 09/25/2016  . Essential hypertension 07/01/2009  . 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 Human immunodeficiency virus (HIV) disease ([place]) 10/17/2006  . ANXIETY 10/17/2006  . ABUSE, ALCOHOL, CONTINUOUS 10/17/2006  . DISORDER, TOBACCO USE 10/17/2006  . Depression 10/17/2006    Patient's Medications  New Prescriptions   TADALAFIL (CIALIS) 10 MG TABLET    Take 1 tablet (10 mg total) by mouth daily as needed for erectile dysfunction.  Previous Medications   ACETAMINOPHEN (TYLENOL) 325 MG TABLET    Take 650 mg by mouth every 6 (six) hours as needed for mild pain or fever.   ALBUTEROL (VENTOLIN HFA) 108 (90 BASE) MCG/ACT INHALER    Inhale 2 puffs into the lungs every 2 (two) hours as needed for wheezing or shortness of breath (cough).   HYDROCHLOROTHIAZIDE (HYDRODIURIL) 50 MG TABLET    TAKE 1 TABLET BY MOUTH DAILY   OMEPRAZOLE (PRILOSEC) 20 MG CAPSULE    Take 1 capsule (20 mg total) by mouth daily.   PROMETHAZINE-DEXTROMETHORPHAN (PROMETHAZINE-DM) 6.25-15 MG/5ML SYRUP    Take 5 mLs by mouth 4 (four) times daily as needed for cough.  Modified Medications   Modified Medication Previous Medication   BICTEGRAVIR-EMTRICITABINE-TENOFOVIR AF (BIKTARVY) 50-200-25 MG TABS TABLET BIKTARVY 50-200-25 MG TABS tablet      Take 1 tablet by mouth daily.    TAKE 1 TABLET BY MOUTH DAILY   DARUNAVIR-COBICISTAT (PREZCOBIX) 800-150 MG TABLET PREZCOBIX 800-150 MG tablet      Take 1 tablet by mouth daily with breakfast.    TAKE 1 TABLET BY MOUTH DAILY WITH BREAKFAST. SWALLOW WHOLE. DO NOT CURSH, BREAK OR CHEW TABLETS. TAKE WITH  FOOD  Discontinued Medications   TADALAFIL (CIALIS) 5 MG TABLET    Take 1 tablet (5 mg total) by mouth daily as needed for erectile dysfunction.    Allergies: No Known Allergies  Past Medical History: Past Medical History:  Diagnosis Date  . Depression   . HIV disease ([place])   . Substance abuse ([place])     Social History: Social History   Socioeconomic History  . Marital status: Single    Spouse name: Not on file  . Number of children: Not on file  . Years of education: Not on file  . Highest education level: Not on file  Occupational History  . Not on file  Tobacco Use  . Smoking status: Never Smoker  . Smokeless tobacco: Never Used  Substance and Sexual Activity  . Alcohol use: Yes    Alcohol/week: 12.0 standard drinks    Types: 12 Standard drinks or equivalent per week    Comment: weekends  . Drug use: No  . Sexual activity: Yes    Partners: Male    Birth control/protection: Condom    Comment: accepted condoms  Other Topics Concern  . Not on file  Social History Narrative  . Not on file   Social Determinants of Health   Financial Resource Strain:   . Difficulty of Paying Living Expenses: Not on file  Food Insecurity:   . Worried About Charity fundraiser in the Last Year: Not on file  .  Ran Out of Food in the Last Year: Not on file  Transportation Needs:   . Lack of Transportation (Medical): Not on file  . Lack of Transportation (Non-Medical): Not on file  Physical Activity:   . Days of Exercise per Week: Not on file  . Minutes of Exercise per Session: Not on file  Stress:   . Feeling of Stress : Not on file  Social Connections:   . Frequency of Communication with Friends and Family: Not on file  . Frequency of Social Gatherings with Friends and Family: Not on file  . Attends Religious Services: Not on file  . Active Member of Clubs or Organizations: Not on file  . Attends Banker Meetings: Not on file  . Marital Status: Not on file     Labs: Lab Results  Component Value Date   HIV1RNAQUANT 80 (H) 06/25/2019   HIV1RNAQUANT 105 (H) 09/11/2018   HIV1RNAQUANT 227 (H) 03/28/2018   CD4TABS 444 06/25/2019   CD4TABS 560 09/11/2018   CD4TABS 630 03/28/2018    RPR and STI Lab Results  Component Value Date   LABRPR NON-REACTIVE 06/25/2019   LABRPR NON-REACTIVE 12/07/2017   LABRPR NON-REACTIVE 05/25/2017   LABRPR NON REAC 11/06/2015   LABRPR NON REAC 03/27/2015    No flowsheet data found.  Hepatitis B Lab Results  Component Value Date   HEPBSAB NEG 09/29/2016   HEPBSAG Negative 09/25/2016   Hepatitis C No results found for: HEPCAB, HCVRNAPCRQN Hepatitis A No results found for: HAV Lipids: Lab Results  Component Value Date   CHOL 207 (H) 06/25/2019   TRIG 248 (H) 06/25/2019   HDL 84 06/25/2019   CHOLHDL 2.5 06/25/2019   VLDL 48 (H) 11/06/2015   LDLCALC 90 06/25/2019    Current HIV Regimen: Biktarvy + Prezcobix  Assessment: Darryl Berg is here today to renew his HMAP and follow up for his HIV.  He started out the visit by saying he was mad at me because one of our nurses denied his Cialis refill. I told him that I was not the one who denied it. He states that he is taking his Biktarvy and Prezcobix everyday but states that sometimes he takes it late on the weekends. He kept asking about his ED medication, and I have a feeling he cares more about that medication than his HIV.  I asked him this and he laughed.  I encouraged him to not miss doses, take his medications with food, and take them every day.  His viral loads are low but still not undetectable.  He states that he was taking benadryl to help him sleep but has switched over to melatonin.  He thinks he is taking two 10 mg tablets (20 mg total) every night, and I explained that this dose was too high and 10 mg was the max. I explained that he should have started with 1-3 mg nightly. He didn't seem to care much, but I encouraged him not to take that much at  night.  I will refill his medications, including his Cialis. He is taking 2 at a time (10mg ) and that is why it isn't lasting very long.  He promised he that he wasn't taking it every day, and I encouraged him not to do so.  I will change the strength to 10 mg so hopefully the supply will last him longer.  I will get a HIV viral load today and have him see Dr. in 2-3 months.  Plan: - Continue Biktarvy +  Prezcobix - Refill cialis - F/u with Dr. Orvan Falconer 3/16 at 4pm  Amen Staszak L. Belinda Bringhurst, PharmD, BCIDP, AAHIVP, CPP Infectious Diseases Clinical Pharmacist [facility] 09/21/2019, 2:39 PM

## 2019-09-21 ENCOUNTER — Encounter: Payer: Self-pay | Admitting: Internal Medicine

## 2019-09-22 ENCOUNTER — Other Ambulatory Visit: Payer: Self-pay | Admitting: Internal Medicine

## 2019-09-22 DIAGNOSIS — N529 Male erectile dysfunction, unspecified: Secondary | ICD-10-CM

## 2019-09-22 LAB — HIV-1 RNA QUANT-NO REFLEX-BLD
HIV 1 RNA Quant: 31 copies/mL — ABNORMAL HIGH
HIV-1 RNA Quant, Log: 1.49 Log copies/mL — ABNORMAL HIGH

## 2019-10-03 ENCOUNTER — Other Ambulatory Visit: Payer: Self-pay | Admitting: Internal Medicine

## 2019-10-03 ENCOUNTER — Telehealth: Payer: Self-pay

## 2019-10-03 DIAGNOSIS — N529 Male erectile dysfunction, unspecified: Secondary | ICD-10-CM

## 2019-10-03 MED ORDER — TADALAFIL 10 MG PO TABS: 10.0000 mg | ORAL_TABLET | Freq: Every day | ORAL | 0 refills | Status: DC | PRN

## 2019-10-03 NOTE — Addendum Note (Signed)
Addended by: Valarie Cones on: 10/03/2019 04:34 PM   Modules accepted: Orders

## 2019-10-03 NOTE — Telephone Encounter (Signed)
Yes

## 2019-10-03 NOTE — Telephone Encounter (Signed)
Patient requesting refill for tadalafil. Routing to provider for approval. Darryl Berg  

## 2019-10-05 ENCOUNTER — Other Ambulatory Visit: Payer: Self-pay | Admitting: Internal Medicine

## 2019-10-05 DIAGNOSIS — N529 Male erectile dysfunction, unspecified: Secondary | ICD-10-CM

## 2019-11-13 ENCOUNTER — Ambulatory Visit: Payer: Self-pay | Admitting: Internal Medicine

## 2019-12-05 ENCOUNTER — Other Ambulatory Visit: Payer: Self-pay

## 2019-12-05 DIAGNOSIS — B20 Human immunodeficiency virus [HIV] disease: Secondary | ICD-10-CM

## 2019-12-05 DIAGNOSIS — Z79899 Other long term (current) drug therapy: Secondary | ICD-10-CM

## 2019-12-05 DIAGNOSIS — Z113 Encounter for screening for infections with a predominantly sexual mode of transmission: Secondary | ICD-10-CM

## 2019-12-10 ENCOUNTER — Other Ambulatory Visit: Payer: Self-pay | Admitting: Internal Medicine

## 2019-12-10 DIAGNOSIS — N529 Male erectile dysfunction, unspecified: Secondary | ICD-10-CM

## 2019-12-25 ENCOUNTER — Ambulatory Visit: Payer: Self-pay | Admitting: Internal Medicine

## 2020-01-11 ENCOUNTER — Other Ambulatory Visit: Payer: Self-pay

## 2020-01-29 ENCOUNTER — Encounter: Payer: Self-pay | Admitting: Internal Medicine

## 2020-01-30 ENCOUNTER — Other Ambulatory Visit: Payer: Self-pay | Admitting: Internal Medicine

## 2020-01-30 DIAGNOSIS — I10 Essential (primary) hypertension: Secondary | ICD-10-CM

## 2020-02-16 ENCOUNTER — Emergency Department (HOSPITAL_COMMUNITY)
Admission: EM | Admit: 2020-02-16 | Discharge: 2020-02-16 | Disposition: A | Discharge status: Home / Self Care | Payer: HRSA Program | Attending: Emergency Medicine | Admitting: Emergency Medicine

## 2020-02-16 ENCOUNTER — Other Ambulatory Visit: Payer: Self-pay

## 2020-02-16 ENCOUNTER — Emergency Department (HOSPITAL_COMMUNITY): Payer: HRSA Program

## 2020-02-16 ENCOUNTER — Encounter (HOSPITAL_COMMUNITY): Payer: Self-pay | Admitting: Emergency Medicine

## 2020-02-16 DIAGNOSIS — R0789 Other chest pain: Secondary | ICD-10-CM | POA: Diagnosis present

## 2020-02-16 DIAGNOSIS — R0602 Shortness of breath: Secondary | ICD-10-CM | POA: Insufficient documentation

## 2020-02-16 DIAGNOSIS — Z79899 Other long term (current) drug therapy: Secondary | ICD-10-CM | POA: Insufficient documentation

## 2020-02-16 DIAGNOSIS — Z20822 Contact with and (suspected) exposure to covid-19: Secondary | ICD-10-CM | POA: Insufficient documentation

## 2020-02-16 DIAGNOSIS — I10 Essential (primary) hypertension: Secondary | ICD-10-CM | POA: Diagnosis not present

## 2020-02-16 DIAGNOSIS — B20 Human immunodeficiency virus [HIV] disease: Secondary | ICD-10-CM | POA: Diagnosis not present

## 2020-02-16 DIAGNOSIS — J90 Pleural effusion, not elsewhere classified: Secondary | ICD-10-CM | POA: Insufficient documentation

## 2020-02-16 LAB — BASIC METABOLIC PANEL
Anion gap: 9 (ref 5–15)
BUN: 11 mg/dL (ref 6–20)
CO2: 25 mmol/L (ref 22–32)
Calcium: 9 mg/dL (ref 8.9–10.3)
Chloride: 103 mmol/L (ref 98–111)
Creatinine, Ser: 0.98 mg/dL (ref 0.61–1.24)
GFR calc Af Amer: >60 mL/min (ref >60)
GFR calc non Af Amer: >60 mL/min (ref >60)
Glucose, Bld: 98 mg/dL (ref 70–99)
Potassium: 4 mmol/L (ref 3.5–5.1)
Sodium: 137 mmol/L (ref 135–145)

## 2020-02-16 LAB — CBC
HCT: 39.9 % (ref 39.0–52.0)
Hemoglobin: 12.7 g/dL — ABNORMAL LOW (ref 13.0–17.0)
MCH: 31.7 pg (ref 26.0–34.0)
MCHC: 31.8 g/dL (ref 30.0–36.0)
MCV: 99.5 fL (ref 80.0–100.0)
Platelets: 301 10*3/uL (ref 150–400)
RBC: 4.01 MIL/uL — ABNORMAL LOW (ref 4.22–5.81)
RDW: 13.2 % (ref 11.5–15.5)
WBC: 6.7 10*3/uL (ref 4.0–10.5)
nRBC: 0 % (ref 0.0–0.2)

## 2020-02-16 LAB — TROPONIN I (HIGH SENSITIVITY)
Troponin I (High Sensitivity): 6 ng/L (ref <18)
Troponin I (High Sensitivity): 8 ng/L (ref <18)

## 2020-02-16 LAB — SARS CORONAVIRUS 2 BY RT PCR (HOSPITAL ORDER, PERFORMED IN ~~LOC~~ HOSPITAL LAB): SARS Coronavirus 2: NEGATIVE

## 2020-02-16 MED ORDER — IOHEXOL 350 MG/ML SOLN
100.0000 mL | Freq: Once | INTRAVENOUS | Status: AC | PRN
  Administered 2020-02-16: 100 mL via INTRAVENOUS

## 2020-02-16 MED ORDER — NAPROXEN 375 MG PO TABS
375.0000 mg | ORAL_TABLET | Freq: Two times a day (BID) | ORAL | 0 refills | Status: DC
Start: 2020-02-16 — End: 2020-10-22

## 2020-02-16 MED ORDER — SODIUM CHLORIDE 0.9% FLUSH: 3.0000 mL | Freq: Once | INTRAVENOUS | Status: DC

## 2020-02-16 MED ORDER — KETOROLAC TROMETHAMINE 30 MG/ML IJ SOLN
30.0000 mg | Freq: Once | INTRAMUSCULAR | Status: AC
  Administered 2020-02-16: 30 mg via INTRAVENOUS
  Filled 2020-02-16: qty 1

## 2020-02-16 NOTE — ED Triage Notes (Addendum)
Pt presents to ED POv. Pt c/o CP and SOB that began Tues. Pt states that the pain moves all around his chest. Pt c/o of cough and worsening pain w/ cough. REsp e/u, NAD.

## 2020-02-16 NOTE — ED Notes (Signed)
Pt to CT

## 2020-02-16 NOTE — Discharge Instructions (Addendum)
Person Under Monitoring Name: Darryl Berg  Location: [address] [place] [address]   Infection Prevention Recommendations for Individuals Confirmed to have, or Being Evaluated for, 2019 Novel Coronavirus (COVID-19) Infection Who Receive Care at Home  Individuals who are confirmed to have, or are being evaluated for, COVID-19 should follow the prevention steps below until a healthcare provider or local or state health department says they can return to normal activities.  Stay home except to get medical care You should restrict activities outside your home, except for getting medical care. Do not go to work, school, or public areas, and do not use public transportation or taxis.  Call ahead before visiting your doctor Before your medical appointment, call the healthcare provider and tell them that you have, or are being evaluated for, COVID-19 infection. This will help the healthcare provider's office take steps to keep other people from getting infected. Ask your healthcare provider to call the local or state health department.  Monitor your symptoms Seek prompt medical attention if your illness is worsening (e.g., difficulty breathing). Before going to your medical appointment, call the healthcare provider and tell them that you have, or are being evaluated for, COVID-19 infection. Ask your healthcare provider to call the local or state health department.  Wear a facemask You should wear a facemask that covers your nose and mouth when you are in the same room with other people and when you visit a healthcare provider. People who live with or visit you should also wear a facemask while they are in the same room with you.  Separate yourself from other people in your home As much as possible, you should stay in a different room from other people in your home. Also, you should use a separate bathroom, if available.  Avoid sharing household items You should not share  dishes, drinking glasses, cups, eating utensils, towels, bedding, or other items with other people in your home. After using these items, you should wash them thoroughly with soap and water.  Cover your coughs and sneezes Cover your mouth and nose with a tissue when you cough or sneeze, or you can cough or sneeze into your sleeve. Throw used tissues in a lined trash can, and immediately wash your hands with soap and water for at least 20 seconds or use an alcohol-based hand rub.  Wash your [employer] your hands often and thoroughly with soap and water for at least 20 seconds. You can use an alcohol-based hand sanitizer if soap and water are not available and if your hands are not visibly dirty. Avoid touching your eyes, nose, and mouth with unwashed hands.   Prevention Steps for Caregivers and Household Members of Individuals Confirmed to have, or Being Evaluated for, COVID-19 Infection Being Cared for in the Home  If you live with, or provide care at home for, a person confirmed to have, or being evaluated for, COVID-19 infection please follow these guidelines to prevent infection:  Follow healthcare provider's instructions Make sure that you understand and can help the patient follow any healthcare provider instructions for all care.  Provide for the patient's basic needs You should help the patient with basic needs in the home and provide support for getting groceries, prescriptions, and other personal needs.  Monitor the patient's symptoms If they are getting sicker, call his or her medical provider and tell them that the patient has, or is being evaluated for, COVID-19 infection. This will help the healthcare provider's office  take steps to keep other people from getting infected. Ask the healthcare provider to call the local or state health department.  Limit the number of people who have contact with the patient If possible, have only one caregiver for the patient. Other  household members should stay in another home or place of residence. If this is not possible, they should stay in another room, or be separated from the patient as much as possible. Use a separate bathroom, if available. Restrict visitors who do not have an essential need to be in the home.  Keep older adults, very young children, and other sick people away from the patient Keep older adults, very young children, and those who have compromised immune systems or chronic health conditions away from the patient. This includes people with chronic heart, lung, or kidney conditions, diabetes, and cancer.  Ensure good ventilation Make sure that shared spaces in the home have good air flow, such as from an air conditioner or an opened window, weather permitting.  Wash your hands often Wash your hands often and thoroughly with soap and water for at least 20 seconds. You can use an alcohol based hand sanitizer if soap and water are not available and if your hands are not visibly dirty. Avoid touching your eyes, nose, and mouth with unwashed hands. Use disposable paper towels to dry your hands. If not available, use dedicated cloth towels and replace them when they become wet.  Wear a facemask and gloves Wear a disposable facemask at all times in the room and gloves when you touch or have contact with the patient's blood, body fluids, and/or secretions or excretions, such as sweat, saliva, sputum, nasal mucus, vomit, urine, or feces.  Ensure the mask fits over your nose and mouth tightly, and do not touch it during use. Throw out disposable facemasks and gloves after using them. Do not reuse. Wash your hands immediately after removing your facemask and gloves. If your personal clothing becomes contaminated, carefully remove clothing and launder. Wash your hands after handling contaminated clothing. Place all used disposable facemasks, gloves, and other waste in a lined container before disposing them with  other household waste. Remove gloves and wash your hands immediately after handling these items.  Do not share dishes, glasses, or other household items with the patient Avoid sharing household items. You should not share dishes, drinking glasses, cups, eating utensils, towels, bedding, or other items with a patient who is confirmed to have, or being evaluated for, COVID-19 infection. After the person uses these items, you should wash them thoroughly with soap and water.  Wash laundry thoroughly Immediately remove and wash clothes or bedding that have blood, body fluids, and/or secretions or excretions, such as sweat, saliva, sputum, nasal mucus, vomit, urine, or feces, on them. Wear gloves when handling laundry from the patient. Read and follow directions on labels of laundry or clothing items and detergent. In general, wash and dry with the warmest temperatures recommended on the label.  Clean all areas the individual has used often Clean all touchable surfaces, such as counters, tabletops, doorknobs, bathroom fixtures, toilets, phones, keyboards, tablets, and bedside tables, every day. Also, clean any surfaces that may have blood, body fluids, and/or secretions or excretions on them. Wear gloves when cleaning surfaces the patient has come in contact with. Use a diluted bleach solution (e.g., dilute bleach with 1 part bleach and 10 parts water) or a household disinfectant with a label that says EPA-registered for coronaviruses. To make a bleach  solution at home, add 1 tablespoon of bleach to 1 quart (4 cups) of water. For a larger supply, add  cup of bleach to 1 gallon (16 cups) of water. Read labels of cleaning products and follow recommendations provided on product labels. Labels contain instructions for safe and effective use of the cleaning product including precautions you should take when applying the product, such as wearing gloves or eye protection and making sure you have good ventilation  during use of the product. Remove gloves and wash hands immediately after cleaning.  Monitor yourself for signs and symptoms of illness Caregivers and household members are considered close contacts, should monitor their health, and will be asked to limit movement outside of the home to the extent possible. Follow the monitoring steps for close contacts listed on the symptom monitoring form.   ? If you have additional questions, contact your local health department or call the epidemiologist on call at [phone] (available 24/7). ? This guidance is subject to change. For the most up-to-date guidance from [facility], please refer to their website: [email or web address]

## 2020-02-16 NOTE — ED Provider Notes (Signed)
[facility] EMERGENCY DEPARTMENT Provider Note   CSN: [id number] Arrival date & time: 02/16/20  0034     History Chief Complaint  Patient presents with   Chest Pain   Shortness of Breath    Darryl Berg is a 44 y.o. male.  The history is provided by the patient.  Chest Pain Chest pain location: entire chest  Pain quality: dull   Pain radiates to:  Does not radiate Pain severity:  Moderate Onset quality:  Gradual Duration:  2 days Timing:  Constant Progression:  Unchanged Chronicity:  New Context: at rest   Relieved by:  Nothing Worsened by:  Nothing Ineffective treatments:  None tried Associated symptoms: cough and shortness of breath   Associated symptoms: no abdominal pain, no back pain, no diaphoresis, no dizziness and no fever   Risk factors: male sex   Risk factors: no aortic disease and no coronary artery disease   Shortness of Breath Severity:  Mild Onset quality:  Gradual Timing:  Constant Progression:  Waxing and waning Chronicity:  New Context: not activity, not animal exposure, not emotional upset, not fumes, not strong odors, not URI and not weather changes   Associated symptoms: chest pain and cough   Associated symptoms: no abdominal pain, no diaphoresis and no fever   Risk factors: no recent alcohol use   Patient complains of ongoing chest pain and SOB with dry cough.  No DOE, no exertional symptoms.  No n/v/d.  No fevers.       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   Human  immunodeficiency virus (HIV) disease (HCC) 10/17/2006   ANXIETY 10/17/2006   ABUSE, ALCOHOL, CONTINUOUS 10/17/2006   DISORDER, TOBACCO USE 10/17/2006   Depression 10/17/2006    Past Surgical History:  Procedure Laterality Date   NASAL HEMORRHAGE CONTROL  11/17/2011   Procedure: EPISTAXIS CONTROL;  Surgeon: Darletta Moll, MD;  Location: [facility] OR;  Service: ENT;  Laterality: Bilateral;       Family History  Problem Relation Age of Onset   Hypertension Father     Social History   Tobacco Use   Smoking status: Never Smoker   Smokeless tobacco: Never Used  Vaping Use   Vaping Use: Never used  Substance Use Topics   Alcohol use: Yes    Alcohol/week: 12.0 standard drinks    Types: 12 Standard drinks or equivalent per week    Comment: weekends   Drug use: No    Home Medications Prior to Admission medications   Medication Sig Start Date End Date Taking? Authorizing Provider  acetaminophen (TYLENOL) 325 MG tablet Take 650 mg by mouth every 6 (six) hours as needed for mild pain or fever.   Yes [provider]  bictegravir-emtricitabine-tenofovir AF (BIKTARVY) 50-200-25 MG TABS tablet Take 1 tablet by mouth daily. 09/19/19  Yes Kuppelweiser, Cassie L, RPH-CPP  darunavir-cobicistat (PREZCOBIX) 800-150 MG tablet Take 1 tablet by mouth daily with breakfast. 09/19/19  Yes Kuppelweiser, Cassie L, RPH-CPP  hydrochlorothiazide (HYDRODIURIL) 50 MG tablet TAKE 1 TABLET BY MOUTH DAILY Patient  taking differently: Take 50 mg by mouth daily.  01/30/20  Yes Cliffton Asters, MD  ibuprofen (ADVIL) 200 MG tablet Take 600 mg by mouth every 6 (six) hours as needed for moderate pain.   Yes [provider]  omeprazole (PRILOSEC) 20 MG capsule Take 1 capsule (20 mg total) by mouth daily. 09/26/16  Yes Althia Forts, MD  tadalafil (CIALIS) 10 MG tablet Take 1 tablet (10 mg total) by mouth daily as needed for erectile dysfunction. 10/03/19  Yes Cliffton Asters, MD  albuterol (VENTOLIN HFA) 108  (90 Base) MCG/ACT inhaler Inhale 2 puffs into the lungs every 2 (two) hours as needed for wheezing or shortness of breath (cough). Patient not taking: Reported on 02/16/2020 08/17/19   Muthersbaugh, Dahlia Client, PA-C  promethazine-dextromethorphan (PROMETHAZINE-DM) 6.25-15 MG/5ML syrup Take 5 mLs by mouth 4 (four) times daily as needed for cough. Patient not taking: Reported on 02/16/2020 08/17/19   Muthersbaugh, Dahlia Client, PA-C  diltiazem (CARDIZEM) 60 MG tablet Take 60 mg by mouth 2 (two) times daily.    11/17/11  [provider]    Allergies    Patient has no known allergies.  Review of Systems   Review of Systems  Constitutional: Negative for diaphoresis and fever.  HENT: Negative for congestion.   Eyes: Negative for visual disturbance.  Respiratory: Positive for cough and shortness of breath.   Cardiovascular: Positive for chest pain.  Gastrointestinal: Negative for abdominal pain.  Genitourinary: Negative for difficulty urinating.  Musculoskeletal: Negative for back pain and gait problem.  Neurological: Negative for dizziness.  Psychiatric/Behavioral: Negative for agitation.  All other systems reviewed and are negative.   Physical Exam Updated Vital Signs BP (!) 148/102    Pulse 87    Temp 99.5 F (37.5 C) (Oral)    Resp (!) 21    Ht 5\' 6"  (1.676 m)    Wt 74.8 kg    SpO2 100%    BMI 26.63 kg/m   Physical Exam Vitals and nursing note reviewed.  Constitutional:      General: He is not in acute distress.    Appearance: Normal appearance.  HENT:     Head: Normocephalic and atraumatic.     Nose: Nose normal.  Eyes:     Conjunctiva/sclera: Conjunctivae normal.     Pupils: Pupils are equal, round, and reactive to light.  Cardiovascular:     Rate and Rhythm: Normal rate and regular rhythm.     Pulses: Normal pulses.     Heart sounds: Normal heart sounds.  Pulmonary:     Effort: Pulmonary effort is normal.     Breath sounds: Normal breath sounds.  Abdominal:     General:  Abdomen is flat. Bowel sounds are normal.     Tenderness: There is no abdominal tenderness. There is no guarding or rebound.  Musculoskeletal:        General: Normal range of motion.     Cervical back: Normal range of motion and neck supple.  Skin:    General: Skin is warm and dry.     Capillary Refill: Capillary refill takes less than 2 seconds.  Neurological:     General: No focal deficit present.     Mental Status: He is alert and oriented to person, place, and time.     Deep Tendon Reflexes: Reflexes normal.  Psychiatric:        Mood and Affect: Mood normal.        Behavior: Behavior normal.     ED Results /  Procedures / Treatments   Labs (all labs ordered are listed, but only abnormal results are displayed) Results for orders placed or performed during the hospital encounter of 02/16/20  Basic metabolic panel  Result Value Ref Range   Sodium 137 135 - 145 mmol/L   Potassium 4.0 3.5 - 5.1 mmol/L   Chloride 103 98 - 111 mmol/L   CO2 25 22 - 32 mmol/L   Glucose, Bld 98 70 - 99 mg/dL   BUN 11 6 - 20 mg/dL   Creatinine, Ser 5.620.98 0.61 - 1.24 mg/dL   Calcium 9.0 8.9 - 13.010.3 mg/dL   GFR calc non Af Amer >60 >60 mL/min   GFR calc Af Amer >60 >60 mL/min   Anion gap 9 5 - 15  CBC  Result Value Ref Range   WBC 6.7 4.0 - 10.5 K/uL   RBC 4.01 (L) 4.22 - 5.81 MIL/uL   Hemoglobin 12.7 (L) 13.0 - 17.0 g/dL   HCT 86.539.9 39 - 52 %   MCV 99.5 80.0 - 100.0 fL   MCH 31.7 26.0 - 34.0 pg   MCHC 31.8 30.0 - 36.0 g/dL   RDW 78.413.2 69.611.5 - 29.515.5 %   Platelets 301 150 - 400 K/uL   nRBC 0.0 0.0 - 0.2 %  Troponin I (High Sensitivity)  Result Value Ref Range   Troponin I (High Sensitivity) 6 <18 ng/L  Troponin I (High Sensitivity)  Result Value Ref Range   Troponin I (High Sensitivity) 8 <18 ng/L   DG Chest 2 View  Result Date: 02/16/2020 CLINICAL DATA:  Chest pain for 2 weeks after fall. Shortness of breath. EXAM: CHEST - 2 VIEW COMPARISON:  11/11/2004 FINDINGS: The cardiomediastinal contours  are normal. Suspected small bilateral pleural effusions. Linear atelectasis or scarring in the left lower lobe. Pulmonary vasculature is normal. No consolidation or pneumothorax. No acute osseous abnormalities are seen. IMPRESSION: Suspected small bilateral pleural effusions. Linear atelectasis or scarring in the left lower lobe. Electronically Signed   By: Narda RutherfordMelanie  Sanford M.D.   On: 02/16/2020 01:24   CT Angio Chest PE W and/or Wo Contrast  Result Date: 02/16/2020 CLINICAL DATA:  Chest pain.  Cough. EXAM: CT ANGIOGRAPHY CHEST WITH CONTRAST TECHNIQUE: Multidetector CT imaging of the chest was performed using the standard protocol during bolus administration of intravenous contrast. Multiplanar CT image reconstructions and MIPs were obtained to evaluate the vascular anatomy. CONTRAST:  100mL OMNIPAQUE IOHEXOL 350 MG/ML SOLN COMPARISON:  None. FINDINGS: Cardiovascular: No filling defects within the pulmonary arteries to suggest acute pulmonary embolism. Mediastinum/Nodes: No axillary supraclavicular adenopathy no mediastinal hilar adenopathy no pericardial effusion. Lungs/Pleura: No pulmonary infarction. Atelectasis and small effusions LEFT and RIGHT lung base. No focal infiltrate. Upper Abdomen: Limited view of the liver, kidneys, pancreas are unremarkable. Normal adrenal glands. Musculoskeletal: No aggressive osseous lead Review of the MIP images confirms the above findings. IMPRESSION: 1. No evidence acute pulmonary embolism. 2. Bibasilar atelectasis and small pleural effusions. 3. No evidence pneumonia or infarction Electronically Signed   By: Genevive BiStewart  Edmunds M.D.   On: 02/16/2020 04:48    EKG EKG Interpretation  Date/Time:  Saturday February 16 2020 01:44:04 EDT Ventricular Rate:  86 PR Interval:    QRS Duration: 86 QT Interval:  351 QTC Calculation: 420 R Axis:   53 Text Interpretation: Sinus rhythm Nonspecific T abnormalities, inferior leads Confirmed by Nicanor AlconPalumbo, Kourtni Stineman ([id number]) on 02/16/2020  3:18:56 AM   Radiology DG Chest 2 View  Result Date: 02/16/2020 CLINICAL DATA:  Chest  pain for 2 weeks after fall. Shortness of breath. EXAM: CHEST - 2 VIEW COMPARISON:  11/11/2004 FINDINGS: The cardiomediastinal contours are normal. Suspected small bilateral pleural effusions. Linear atelectasis or scarring in the left lower lobe. Pulmonary vasculature is normal. No consolidation or pneumothorax. No acute osseous abnormalities are seen. IMPRESSION: Suspected small bilateral pleural effusions. Linear atelectasis or scarring in the left lower lobe. Electronically Signed   By: Narda Rutherford M.D.   On: 02/16/2020 01:24   CT Angio Chest PE W and/or Wo Contrast  Result Date: 02/16/2020 CLINICAL DATA:  Chest pain.  Cough. EXAM: CT ANGIOGRAPHY CHEST WITH CONTRAST TECHNIQUE: Multidetector CT imaging of the chest was performed using the standard protocol during bolus administration of intravenous contrast. Multiplanar CT image reconstructions and MIPs were obtained to evaluate the vascular anatomy. CONTRAST:  OMNIPAQUE IOHEXOL 350 MG/ML SOLN COMPARISON:  None. FINDINGS: Cardiovascular: No filling defects within the pulmonary arteries to suggest acute pulmonary embolism. Mediastinum/Nodes: No axillary supraclavicular adenopathy no mediastinal hilar adenopathy no pericardial effusion. Lungs/Pleura: No pulmonary infarction. Atelectasis and small effusions LEFT and RIGHT lung base. No focal infiltrate. Upper Abdomen: Limited view of the liver, kidneys, pancreas are unremarkable. Normal adrenal glands. Musculoskeletal: No aggressive osseous lead Review of the MIP images confirms the above findings. IMPRESSION: 1. No evidence acute pulmonary embolism. 2. Bibasilar atelectasis and small pleural effusions. 3. No evidence pneumonia or infarction Electronically Signed   By: Genevive Bi M.D.   On: 02/16/2020 04:48    Procedures Procedures (including critical care time)  Medications Ordered in  ED Medications  sodium chloride flush (NS) 0.9 % injection 3 mL (3 mLs Intravenous Not Given 02/16/20 0317)  ketorolac (TORADOL) 30 MG/ML injection 30 mg (30 mg Intravenous Given 02/16/20 0334)  iohexol (OMNIPAQUE) 350 MG/ML injection 100 mL (100 mLs Intravenous Contrast Given 02/16/20 0413)    ED Course  I have reviewed the triage vital signs and the nursing notes.  Pertinent labs & imaging results that were available during my care of the patient were reviewed by me and considered in my medical decision making (see chart for details).   RUled out for MI in the ED with heart score of 2. Ruled out for PE in the ED.  Sent covid swab sent as patient stated he has cough.  Will send to ID for close follow up.  Ruled out for MI and PE in the department.  No signs of TB or PNA on CT scan.  No arthralgias. No joint pain or swelling.  Denies IVDA.  No back pain.  I do see signs of sepsis.  As oxygen saturation is 100% on room air patient is stable for discharge.  Will place in home isolation pending results of covid test.   Darryl Berg was evaluated in Emergency Department on 02/16/2020 for the symptoms described in the history of present illness. He was evaluated in the context of the global COVID-19 pandemic, which necessitated consideration that the patient might be at risk for infection with the SARS-CoV-2 virus that causes COVID-19. Institutional protocols and algorithms that pertain to the evaluation of patients at risk for COVID-19 are in a state of rapid change based on information released by regulatory bodies including the CDC and federal and state organizations. These policies and algorithms were followed during the patient's care in the ED. Final Clinical Impression(s) / ED Diagnoses Final diagnoses:  Pleural effusion   Return for intractable cough, coughing up blood,fevers >100.4 unrelieved by medication, shortness of breath,  intractable vomiting, chest pain, shortness of breath,  weakness,numbness, changes in speech, facial asymmetry,abdominal pain, passing out,Inability to tolerate liquids or food, cough, altered mental status or any concerns. No signs of systemic illness or infection. The patient is nontoxic-appearing on exam and vital signs are within normal limits.   I have reviewed the triage vital signs and the nursing notes. Pertinent labs &imaging results that were available during my care of the patient were reviewed by me and considered in my medical decision making (see chart for details).After history, exam, and medical workup I feel the patient has beenappropriately medically screened and is safe for discharge home. Pertinent diagnoses were discussed with the patient. Patient was given return precautions.        Arrion Burruel, MD 02/16/20 0502

## 2020-02-28 ENCOUNTER — Other Ambulatory Visit: Payer: Self-pay

## 2020-02-28 DIAGNOSIS — B20 Human immunodeficiency virus [HIV] disease: Secondary | ICD-10-CM

## 2020-02-28 DIAGNOSIS — Z113 Encounter for screening for infections with a predominantly sexual mode of transmission: Secondary | ICD-10-CM

## 2020-02-29 ENCOUNTER — Other Ambulatory Visit: Payer: Self-pay | Admitting: Internal Medicine

## 2020-02-29 DIAGNOSIS — N529 Male erectile dysfunction, unspecified: Secondary | ICD-10-CM

## 2020-02-29 LAB — URINE CYTOLOGY ANCILLARY ONLY
Chlamydia: NEGATIVE
Comment: NEGATIVE
Comment: NORMAL
Neisseria Gonorrhea: NEGATIVE

## 2020-02-29 LAB — T-HELPER CELL (CD4) - (RCID CLINIC ONLY)
CD4 % Helper T Cell: 22 % — ABNORMAL LOW (ref 33–65)
CD4 T Cell Abs: 856 /uL (ref 400–1790)

## 2020-03-02 LAB — CBC WITH DIFFERENTIAL/PLATELET
Absolute Monocytes: 446 cells/uL (ref 200–950)
Basophils Absolute: 72 cells/uL (ref 0–200)
Basophils Relative: 1.3 %
Eosinophils Absolute: 61 cells/uL (ref 15–500)
Eosinophils Relative: 1.1 %
HCT: 43.2 % (ref 38.5–50.0)
Hemoglobin: 14.1 g/dL (ref 13.2–17.1)
Lymphs Abs: 4312 cells/uL — ABNORMAL HIGH (ref 850–3900)
MCH: 31.1 pg (ref 27.0–33.0)
MCHC: 32.6 g/dL (ref 32.0–36.0)
MCV: 95.2 fL (ref 80.0–100.0)
MPV: 9.3 fL (ref 7.5–12.5)
Monocytes Relative: 8.1 %
Neutro Abs: 611 cells/uL — ABNORMAL LOW (ref 1500–7800)
Neutrophils Relative %: 11.1 %
Platelets: 264 10*3/uL (ref 140–400)
RBC: 4.54 10*6/uL (ref 4.20–5.80)
RDW: 13.7 % (ref 11.0–15.0)
Total Lymphocyte: 78.4 %
WBC: 5.5 10*3/uL (ref 3.8–10.8)

## 2020-03-02 LAB — COMPREHENSIVE METABOLIC PANEL
AG Ratio: 1.2 (calc) (ref 1.0–2.5)
ALT: 20 U/L (ref 9–46)
AST: 45 U/L — ABNORMAL HIGH (ref 10–40)
Albumin: 4.2 g/dL (ref 3.6–5.1)
Alkaline phosphatase (APISO): 61 U/L (ref 36–130)
BUN: 8 mg/dL (ref 7–25)
CO2: 29 mmol/L (ref 20–32)
Calcium: 9 mg/dL (ref 8.6–10.3)
Chloride: 101 mmol/L (ref 98–110)
Creat: 0.98 mg/dL (ref 0.60–1.35)
Globulin: 3.6 g/dL (calc) (ref 1.9–3.7)
Glucose, Bld: 105 mg/dL — ABNORMAL HIGH (ref 65–99)
Potassium: 4.1 mmol/L (ref 3.5–5.3)
Sodium: 142 mmol/L (ref 135–146)
Total Bilirubin: 0.6 mg/dL (ref 0.2–1.2)
Total Protein: 7.8 g/dL (ref 6.1–8.1)

## 2020-03-02 LAB — HIV-1 RNA QUANT-NO REFLEX-BLD
HIV 1 RNA Quant: 75 copies/mL — ABNORMAL HIGH
HIV-1 RNA Quant, Log: 1.88 Log copies/mL — ABNORMAL HIGH

## 2020-03-13 ENCOUNTER — Other Ambulatory Visit: Payer: Self-pay

## 2020-03-13 ENCOUNTER — Ambulatory Visit (INDEPENDENT_AMBULATORY_CARE_PROVIDER_SITE_OTHER): Payer: Self-pay | Admitting: Internal Medicine

## 2020-03-13 ENCOUNTER — Encounter: Payer: Self-pay | Admitting: Internal Medicine

## 2020-03-13 DIAGNOSIS — F330 Major depressive disorder, recurrent, mild: Secondary | ICD-10-CM

## 2020-03-13 DIAGNOSIS — F1010 Alcohol abuse, uncomplicated: Secondary | ICD-10-CM

## 2020-03-13 DIAGNOSIS — B20 Human immunodeficiency virus [HIV] disease: Secondary | ICD-10-CM

## 2020-03-13 NOTE — Assessment & Plan Note (Signed)
He has little insight into the dangers of excessive alcohol use.

## 2020-03-13 NOTE — Assessment & Plan Note (Signed)
He has mild chronic depression and self medicates with alcohol.  He is not ready for counseling yet.

## 2020-03-13 NOTE — Progress Notes (Signed)
Patient Active Problem List   Diagnosis Date Noted  . Essential hypertension 07/01/2009    Priority: High  . Human immunodeficiency virus (HIV) disease (HCC) 10/17/2006    Priority: High  . Acute renal insufficiency 09/29/2016  . Gastritis due to alcohol without hemorrhage 09/26/2016  . Alcoholic hepatitis 09/26/2016  . Asymptomatic cholelithiasis 09/26/2016  . Transaminitis 09/25/2016  . 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 ANXIETY 10/17/2006  . ABUSE, ALCOHOL, CONTINUOUS 10/17/2006  . DISORDER, TOBACCO USE 10/17/2006  . Depression 10/17/2006    Patient's Medications  New Prescriptions   No medications on file  Previous Medications   ACETAMINOPHEN (TYLENOL) 325 MG TABLET    Take 650 mg by mouth every 6 (six) hours as needed for mild pain or fever.   ALBUTEROL (VENTOLIN HFA) 108 (90 BASE) MCG/ACT INHALER    Inhale 2 puffs into the lungs every 2 (two) hours as needed for wheezing or shortness of breath (cough).   BICTEGRAVIR-EMTRICITABINE-TENOFOVIR AF (BIKTARVY) 50-200-25 MG TABS TABLET    Take 1 tablet by mouth daily.   DARUNAVIR-COBICISTAT (PREZCOBIX) 800-150 MG TABLET    Take 1 tablet by mouth daily with breakfast.   HYDROCHLOROTHIAZIDE (HYDRODIURIL) 50 MG TABLET    TAKE 1 TABLET BY MOUTH DAILY   IBUPROFEN (ADVIL) 200 MG TABLET    Take 600 mg by mouth every 6 (six) hours as needed for moderate pain.   NAPROXEN (NAPROSYN) 375 MG TABLET    Take 1 tablet (375 mg total) by mouth 2 (two) times daily with a meal.   OMEPRAZOLE (PRILOSEC) 20 MG CAPSULE    Take 1 capsule (20 mg total) by mouth daily.   PROMETHAZINE-DEXTROMETHORPHAN (PROMETHAZINE-DM) 6.25-15 MG/5ML SYRUP    Take 5 mLs by mouth 4 (four) times daily as needed for cough.   TADALAFIL (CIALIS) 10 MG TABLET    TAKE 1 TABLET BY MOUTH ONCE DAILY AS NEEDED FOR ERECTILE DYSFUNCTION.  Modified Medications   No medications on file  Discontinued Medications   No  medications on file    Subjective: JP is in for his routine HIV follow-up visit.  He denies any problems obtaining, taking or tolerating his Biktarvy.  He takes it each morning.  He missed 1 dose about 2 weeks ago when he had some chest tightness and went to the emergency department before taking it.  His chest x-ray showed only very small bilateral pleural effusions.  His Covid test was negative.  He has not taken the Covid vaccine yet because he is worried that it will make him sick.  He passed out on Memorial Day after drinking quite a bit of Christiane Ha.  He says that now he is only having 1 drink of alcohol on the weekends.  He says that he will often feel depressed if he is not drinking alcohol  Review of Systems: Review of Systems  Constitutional: Negative for chills, diaphoresis, fever, malaise/fatigue and weight loss.  HENT: Negative for sore throat.   Respiratory: Negative for cough, sputum production and shortness of breath.   Cardiovascular: Negative for chest pain.  Gastrointestinal: Negative for abdominal pain, diarrhea, heartburn, nausea and vomiting.  Genitourinary: Negative for dysuria and frequency.  Musculoskeletal: Negative for joint pain and myalgias.  Skin: Negative for rash.  Neurological: Negative for dizziness and headaches.  Psychiatric/Behavioral: Positive for depression. Negative for substance abuse. The patient is not nervous/anxious.     Past Medical History:  Diagnosis Date  . Depression   . HIV disease (HCC)   . Substance abuse (HCC)     Social History   Tobacco Use  . Smoking status: Never Smoker  . Smokeless tobacco: Never Used  Vaping Use  . Vaping Use: Never used  Substance Use Topics  . Alcohol use: Yes    Alcohol/week: 12.0 standard drinks    Types: 12 Standard drinks or equivalent per week    Comment: weekends  . Drug use: No    Family History  Problem Relation Age of Onset  . Hypertension Father     No Known Allergies  Health  Maintenance  Topic Date Due  . INFLUENZA VACCINE  03/30/2020  . TETANUS/TDAP  07/18/2021  . Hepatitis C Screening  Completed  . HIV Screening  Completed    Objective:  Vitals:   03/13/20 0848  BP: (!) 146/101  Pulse: 87  SpO2: 100%  Weight: 157 lb 6.4 oz (71.4 kg)   Body mass index is 25.41 kg/m.  Physical Exam Constitutional:      Comments: He is in good spirits.  Cardiovascular:     Rate and Rhythm: Normal rate and regular rhythm.     Heart sounds: No murmur heard.   Pulmonary:     Effort: Pulmonary effort is normal.     Breath sounds: Normal breath sounds.  Abdominal:     Palpations: Abdomen is soft.     Tenderness: There is no abdominal tenderness.  Musculoskeletal:        General: No swelling or tenderness.  Skin:    Findings: No rash.  Neurological:     General: No focal deficit present.  Psychiatric:        Mood and Affect: Mood normal.     Lab Results Lab Results  Component Value Date   WBC 5.5 02/28/2020   HGB 14.1 02/28/2020   HCT 43.2 02/28/2020   MCV 95.2 02/28/2020   PLT 264 02/28/2020    Lab Results  Component Value Date   CREATININE 0.98 02/28/2020   BUN 8 02/28/2020   NA 142 02/28/2020   K 4.1 02/28/2020   CL 101 02/28/2020   CO2 29 02/28/2020    Lab Results  Component Value Date   ALT 20 02/28/2020   AST 45 (H) 02/28/2020   ALKPHOS 62 09/29/2016   BILITOT 0.6 02/28/2020    Lab Results  Component Value Date   CHOL 207 (H) 06/25/2019   HDL 84 06/25/2019   LDLCALC 90 06/25/2019   TRIG 248 (H) 06/25/2019   CHOLHDL 2.5 06/25/2019   Lab Results  Component Value Date   LABRPR NON-REACTIVE 06/25/2019   HIV 1 RNA Quant (copies/mL)  Date Value  02/28/2020 75 (H)  09/19/2019 31 (H)  06/25/2019 80 (H)   CD4 T Cell Abs (/uL)  Date Value  02/28/2020 856  06/25/2019 444  09/11/2018 560     Problem List Items Addressed This Visit      High   Human immunodeficiency virus (HIV) disease (HCC)    His infection remains  under good long-term control.  He will continue Biktarvy and follow-up after lab work in 6 months.  I encouraged him to take the Covid vaccine as soon as possible.      Relevant Orders   T-helper cell (CD4)- ([facility] clinic only)   HIV-1 RNA quant-no reflex-bld   CBC   Comprehensive metabolic panel   Lipid panel   RPR     Unprioritized  Depression    He has mild chronic depression and self medicates with alcohol.  He is not ready for counseling yet.      ABUSE, ALCOHOL, CONTINUOUS    He has little insight into the dangers of excessive alcohol use.           Cliffton Asters, MD [facility] [phone] pager   [phone] cell 03/13/2020, 9:27 AM

## 2020-03-13 NOTE — Assessment & Plan Note (Signed)
His infection remains under good long-term control.  He will continue Biktarvy and follow-up after lab work in 6 months.  I encouraged him to take the Covid vaccine as soon as possible.

## 2020-03-25 ENCOUNTER — Ambulatory Visit: Payer: Self-pay

## 2020-04-01 ENCOUNTER — Encounter (HOSPITAL_COMMUNITY): Payer: Self-pay | Admitting: Emergency Medicine

## 2020-04-01 ENCOUNTER — Emergency Department (HOSPITAL_COMMUNITY)
Admission: EM | Admit: 2020-04-01 | Discharge: 2020-04-02 | Disposition: A | Discharge status: Home / Self Care | Payer: Self-pay | Attending: Emergency Medicine | Admitting: Emergency Medicine

## 2020-04-01 DIAGNOSIS — H5789 Other specified disorders of eye and adnexa: Secondary | ICD-10-CM | POA: Insufficient documentation

## 2020-04-01 DIAGNOSIS — R0981 Nasal congestion: Secondary | ICD-10-CM | POA: Insufficient documentation

## 2020-04-01 DIAGNOSIS — Z5321 Procedure and treatment not carried out due to patient leaving prior to being seen by health care provider: Secondary | ICD-10-CM | POA: Insufficient documentation

## 2020-04-01 NOTE — ED Triage Notes (Signed)
Patient presents with drainage and swelling to R eye X1 day. States he has had nasal congestion X few days. NOT vaccinated against COVID.

## 2020-04-02 NOTE — ED Notes (Signed)
Pt called x2 for roll call and once more for vitals recheck with no answer.

## 2020-04-02 NOTE — ED Notes (Signed)
Pt called once more for vitals recheck with no answer

## 2020-04-04 ENCOUNTER — Ambulatory Visit: Payer: Self-pay

## 2020-04-04 ENCOUNTER — Other Ambulatory Visit: Payer: Self-pay

## 2020-04-04 DIAGNOSIS — Z20822 Contact with and (suspected) exposure to covid-19: Secondary | ICD-10-CM

## 2020-04-05 LAB — NOVEL CORONAVIRUS, NAA: SARS-CoV-2, NAA: NOT DETECTED

## 2020-04-05 LAB — SARS-COV-2, NAA 2 DAY TAT

## 2020-04-07 ENCOUNTER — Other Ambulatory Visit: Payer: Self-pay | Admitting: Internal Medicine

## 2020-04-07 ENCOUNTER — Telehealth: Payer: Self-pay

## 2020-04-07 DIAGNOSIS — N529 Male erectile dysfunction, unspecified: Secondary | ICD-10-CM

## 2020-04-07 MED ORDER — TADALAFIL 10 MG PO TABS: ORAL_TABLET | ORAL | 11 refills | Status: DC

## 2020-04-07 NOTE — Telephone Encounter (Signed)
Patient requesting medication refill for Cialis. Routing to MD for approval. Darryl Berg

## 2020-04-07 NOTE — Telephone Encounter (Signed)
I sent refills to his pharmacy.

## 2020-04-07 NOTE — Telephone Encounter (Signed)
Thank you. Patient aware.

## 2020-04-15 ENCOUNTER — Telehealth: Payer: Self-pay

## 2020-04-15 ENCOUNTER — Other Ambulatory Visit: Payer: Self-pay | Admitting: Internal Medicine

## 2020-04-15 DIAGNOSIS — N529 Male erectile dysfunction, unspecified: Secondary | ICD-10-CM

## 2020-04-15 NOTE — Telephone Encounter (Signed)
Received refill request for Cialis from [facility] pharmacy. Will cancel prescription at [facility].  Darryl Berg, [place]

## 2020-06-09 ENCOUNTER — Ambulatory Visit: Payer: Self-pay

## 2020-06-09 ENCOUNTER — Other Ambulatory Visit: Payer: Self-pay | Admitting: Internal Medicine

## 2020-06-09 DIAGNOSIS — N529 Male erectile dysfunction, unspecified: Secondary | ICD-10-CM

## 2020-06-23 ENCOUNTER — Ambulatory Visit: Payer: Self-pay

## 2020-06-23 ENCOUNTER — Other Ambulatory Visit: Payer: Self-pay

## 2020-06-24 ENCOUNTER — Encounter: Payer: Self-pay | Admitting: Internal Medicine

## 2020-07-01 ENCOUNTER — Other Ambulatory Visit: Payer: Self-pay

## 2020-07-01 DIAGNOSIS — Z113 Encounter for screening for infections with a predominantly sexual mode of transmission: Secondary | ICD-10-CM

## 2020-07-01 DIAGNOSIS — B20 Human immunodeficiency virus [HIV] disease: Secondary | ICD-10-CM

## 2020-07-01 DIAGNOSIS — I10 Essential (primary) hypertension: Secondary | ICD-10-CM

## 2020-07-01 DIAGNOSIS — Z79899 Other long term (current) drug therapy: Secondary | ICD-10-CM

## 2020-07-01 MED ORDER — BIKTARVY 50-200-25 MG PO TABS: 1.0000 | ORAL_TABLET | Freq: Every day | ORAL | 3 refills | Status: DC

## 2020-07-01 MED ORDER — HYDROCHLOROTHIAZIDE 50 MG PO TABS: 50.0000 mg | ORAL_TABLET | Freq: Every day | ORAL | 0 refills | Status: DC

## 2020-07-01 MED ORDER — PREZCOBIX 800-150 MG PO TABS: 1.0000 | ORAL_TABLET | Freq: Every day | ORAL | 3 refills | Status: DC

## 2020-07-01 NOTE — Telephone Encounter (Signed)
Received call from pharmacy inquiring if ok to fill patient ART and hydrochlorothiazide. Called patient to confirm that patient has not missed any doses of medication and has been taking consistently since last visit with Dr. Orvan Falconer. Patient states he had come left over from previous medication inconsistencies. Medication sent to pharmacy electronically.  Valarie Cones

## 2020-07-23 ENCOUNTER — Other Ambulatory Visit: Payer: Self-pay

## 2020-08-06 ENCOUNTER — Ambulatory Visit: Payer: Self-pay | Admitting: Internal Medicine

## 2020-08-06 ENCOUNTER — Other Ambulatory Visit: Payer: Self-pay | Admitting: Internal Medicine

## 2020-08-06 DIAGNOSIS — I10 Essential (primary) hypertension: Secondary | ICD-10-CM

## 2020-08-08 ENCOUNTER — Other Ambulatory Visit: Payer: Self-pay

## 2020-08-11 ENCOUNTER — Other Ambulatory Visit: Payer: Self-pay

## 2020-08-11 DIAGNOSIS — Z113 Encounter for screening for infections with a predominantly sexual mode of transmission: Secondary | ICD-10-CM

## 2020-08-11 DIAGNOSIS — B20 Human immunodeficiency virus [HIV] disease: Secondary | ICD-10-CM

## 2020-08-11 DIAGNOSIS — Z79899 Other long term (current) drug therapy: Secondary | ICD-10-CM

## 2020-08-12 LAB — URINE CYTOLOGY ANCILLARY ONLY
Chlamydia: NEGATIVE
Comment: NEGATIVE
Comment: NORMAL
Neisseria Gonorrhea: NEGATIVE

## 2020-08-12 LAB — T-HELPER CELL (CD4) - (RCID CLINIC ONLY)
CD4 % Helper T Cell: 19 % — ABNORMAL LOW (ref 33–65)
CD4 T Cell Abs: 380 /uL — ABNORMAL LOW (ref 400–1790)

## 2020-08-13 LAB — CBC WITH DIFFERENTIAL/PLATELET
Absolute Monocytes: 511 cells/uL (ref 200–950)
Basophils Absolute: 51 cells/uL (ref 0–200)
Basophils Relative: 1.3 %
Eosinophils Absolute: 0 cells/uL — ABNORMAL LOW (ref 15–500)
Eosinophils Relative: 0 %
HCT: 44.4 % (ref 38.5–50.0)
Hemoglobin: 15 g/dL (ref 13.2–17.1)
Lymphs Abs: 2391 cells/uL (ref 850–3900)
MCH: 33 pg (ref 27.0–33.0)
MCHC: 33.8 g/dL (ref 32.0–36.0)
MCV: 97.6 fL (ref 80.0–100.0)
MPV: 9.1 fL (ref 7.5–12.5)
Monocytes Relative: 13.1 %
Neutro Abs: 948 cells/uL — ABNORMAL LOW (ref 1500–7800)
Neutrophils Relative %: 24.3 %
Platelets: 241 10*3/uL (ref 140–400)
RBC: 4.55 10*6/uL (ref 4.20–5.80)
RDW: 14.6 % (ref 11.0–15.0)
Total Lymphocyte: 61.3 %
WBC: 3.9 10*3/uL (ref 3.8–10.8)

## 2020-08-13 LAB — COMPLETE METABOLIC PANEL WITH GFR
AG Ratio: 1.2 (calc) (ref 1.0–2.5)
ALT: 70 U/L — ABNORMAL HIGH (ref 9–46)
AST: 128 U/L — ABNORMAL HIGH (ref 10–40)
Albumin: 4.2 g/dL (ref 3.6–5.1)
Alkaline phosphatase (APISO): 44 U/L (ref 36–130)
BUN: 8 mg/dL (ref 7–25)
CO2: 30 mmol/L (ref 20–32)
Calcium: 9.2 mg/dL (ref 8.6–10.3)
Chloride: 103 mmol/L (ref 98–110)
Creat: 0.99 mg/dL (ref 0.60–1.35)
GFR, Est African American: 106 mL/min/{1.73_m2} (ref >=60)
GFR, Est Non African American: 92 mL/min/{1.73_m2} (ref >=60)
Globulin: 3.4 g/dL (calc) (ref 1.9–3.7)
Glucose, Bld: 92 mg/dL (ref 65–99)
Potassium: 4.6 mmol/L (ref 3.5–5.3)
Sodium: 144 mmol/L (ref 135–146)
Total Bilirubin: 0.5 mg/dL (ref 0.2–1.2)
Total Protein: 7.6 g/dL (ref 6.1–8.1)

## 2020-08-13 LAB — HIV-1 RNA QUANT-NO REFLEX-BLD
HIV 1 RNA Quant: 8140 Copies/mL — ABNORMAL HIGH
HIV-1 RNA Quant, Log: 3.91 Log cps/mL — ABNORMAL HIGH

## 2020-08-13 LAB — RPR: RPR Ser Ql: NONREACTIVE

## 2020-08-13 LAB — LIPID PANEL
Cholesterol: 250 mg/dL — ABNORMAL HIGH (ref <200)
HDL: 108 mg/dL (ref >=40)
LDL Cholesterol (Calc): 125 mg/dL (calc) — ABNORMAL HIGH
Non-HDL Cholesterol (Calc): 142 mg/dL (calc) — ABNORMAL HIGH (ref <130)
Total CHOL/HDL Ratio: 2.3 (calc) (ref <5.0)
Triglycerides: 77 mg/dL (ref <150)

## 2020-08-21 ENCOUNTER — Ambulatory Visit: Payer: Self-pay | Admitting: Internal Medicine

## 2020-09-01 ENCOUNTER — Other Ambulatory Visit: Payer: Self-pay | Admitting: Internal Medicine

## 2020-09-01 DIAGNOSIS — I10 Essential (primary) hypertension: Secondary | ICD-10-CM

## 2020-09-02 ENCOUNTER — Telehealth: Payer: Self-pay

## 2020-09-02 NOTE — Telephone Encounter (Signed)
Received call from [facility] requesting refill for patient's hydrochlorothiazide. RN informed them a refill was sent in today.   Sandie Ano, RN

## 2020-09-04 ENCOUNTER — Encounter: Payer: Self-pay | Admitting: Internal Medicine

## 2020-09-04 ENCOUNTER — Other Ambulatory Visit: Payer: Self-pay

## 2020-09-04 ENCOUNTER — Ambulatory Visit (INDEPENDENT_AMBULATORY_CARE_PROVIDER_SITE_OTHER): Payer: Self-pay | Admitting: Internal Medicine

## 2020-09-04 DIAGNOSIS — F331 Major depressive disorder, recurrent, moderate: Secondary | ICD-10-CM

## 2020-09-04 DIAGNOSIS — F1010 Alcohol abuse, uncomplicated: Secondary | ICD-10-CM

## 2020-09-04 DIAGNOSIS — B20 Human immunodeficiency virus [HIV] disease: Secondary | ICD-10-CM

## 2020-09-04 DIAGNOSIS — I10 Essential (primary) hypertension: Secondary | ICD-10-CM

## 2020-09-04 DIAGNOSIS — R7401 Elevation of levels of liver transaminase levels: Secondary | ICD-10-CM

## 2020-09-04 NOTE — Assessment & Plan Note (Signed)
He can see a direct line between his anxiety, depression and his alcohol use.  He recalls feeling much better with greatly reduce anxiety and depression when he was sober years ago.  He can also tell a difference when he abstains briefly from alcohol over the past few months. We have encouraged him to go to [facility] [place] for primary care, mental health and addiction counseling.

## 2020-09-04 NOTE — Assessment & Plan Note (Signed)
This is likely due to his ongoing alcohol use.

## 2020-09-04 NOTE — Assessment & Plan Note (Signed)
If his timeline is correct his HIV viral load remains elevated above 8000 despite having been back on Biktarvy for about 6 weeks.  I will have him continue Biktarvy for now and repeat his viral load today.  He will follow-up in 4 weeks.  He received his second Covid vaccine recently and has had his influenza vaccine.

## 2020-09-04 NOTE — Progress Notes (Signed)
Patient Active Problem List   Diagnosis Date Noted  . Essential hypertension 07/01/2009    Priority: High  . Human immunodeficiency virus (HIV) disease ([place]) 10/17/2006    Priority: High  . Acute renal insufficiency 09/29/2016  . Gastritis due to alcohol without hemorrhage 09/26/2016  . Alcoholic hepatitis 37/90/2409  . Asymptomatic cholelithiasis 09/26/2016  . Transaminitis 09/25/2016  . 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 ANXIETY 10/17/2006  . ABUSE, ALCOHOL, CONTINUOUS 10/17/2006  . DISORDER, TOBACCO USE 10/17/2006  . Depression 10/17/2006    Patient's Medications  New Prescriptions   No medications on file  Previous Medications   ACETAMINOPHEN (TYLENOL) 325 MG TABLET    Take 650 mg by mouth every 6 (six) hours as needed for mild pain or fever.   ALBUTEROL (VENTOLIN HFA) 108 (90 BASE) MCG/ACT INHALER    Inhale 2 puffs into the lungs every 2 (two) hours as needed for wheezing or shortness of breath (cough).   BICTEGRAVIR-EMTRICITABINE-TENOFOVIR AF (BIKTARVY) 50-200-25 MG TABS TABLET    Take 1 tablet by mouth daily.   DARUNAVIR-COBICISTAT (PREZCOBIX) 800-150 MG TABLET    Take 1 tablet by mouth daily with breakfast.   HYDROCHLOROTHIAZIDE (HYDRODIURIL) 50 MG TABLET    TAKE 1 TABLET BY MOUTH DAILY   IBUPROFEN (ADVIL) 200 MG TABLET    Take 600 mg by mouth every 6 (six) hours as needed for moderate pain.   NAPROXEN (NAPROSYN) 375 MG TABLET    Take 1 tablet (375 mg total) by mouth 2 (two) times daily with a meal.   OMEPRAZOLE (PRILOSEC) 20 MG CAPSULE    Take 1 capsule (20 mg total) by mouth daily.   PROMETHAZINE-DEXTROMETHORPHAN (PROMETHAZINE-DM) 6.25-15 MG/5ML SYRUP    Take 5 mLs by mouth 4 (four) times daily as needed for cough.   TADALAFIL (CIALIS) 10 MG TABLET    TAKE 1 TABLET BY MOUTH ONCE DAILY AS NEEDED FOR ERECTILE DYSFUNCTION  Modified Medications   No medications on file  Discontinued Medications   No  medications on file    Subjective: JP is in for his routine HIV follow-up visit.  Initially he told me that everything was doing very well but then it turns out that he did not renew his ADAP and ran out of his [place] in October.  He is not sure how long he was out.  He thinks he restarted at the end of October.  He says he has not missed doses since that time.  He is currently living apart from his wife and children.  He is not working.  He is feeling more anxious and depressed.  He continues a pattern of binge drinking then trying to stop for several days.  He has been having problem with shakes in the morning or when he is not drinking.  He has been searching online about symptoms of alcohol withdrawal.  He says that he used to attend AA around 2006 because of a court mandate.  [facility] [place].  He says that he was sober for about 6 months and felt much better.  Records indicate that he was last seen at [facility] [place] in 2019.  He does not have a primary care provider.  Review of Systems: Review of Systems  Constitutional: Positive for malaise/fatigue. Negative for chills, diaphoresis, fever and weight loss.  HENT: Negative for sore throat.   Respiratory: Negative  for cough, sputum production and shortness of breath.   Cardiovascular: Negative for chest pain.  Gastrointestinal: Negative for abdominal pain, diarrhea, heartburn, nausea and vomiting.  Genitourinary: Negative for dysuria and frequency.  Musculoskeletal: Negative for joint pain and myalgias.  Skin: Negative for rash.  Neurological: Negative for dizziness and headaches.  Psychiatric/Behavioral: Positive for depression and substance abuse. Negative for suicidal ideas. The patient is nervous/anxious.     Past Medical History:  Diagnosis Date  . Depression   . HIV disease (HCC)   . Substance abuse (HCC)     Social History   Tobacco Use  . Smoking status: Never Smoker  .  Smokeless tobacco: Never Used  Vaping Use  . Vaping Use: Never used  Substance Use Topics  . Alcohol use: Yes    Alcohol/week: 12.0 standard drinks    Types: 12 Standard drinks or equivalent per week    Comment: weekends  . Drug use: No    Family History  Problem Relation Age of Onset  . Hypertension Father     No Known Allergies  Health Maintenance  Topic Date Due  . INFLUENZA VACCINE  03/30/2020  . COLONOSCOPY (Pts 45-60yrs Insurance coverage will need to be confirmed)  Never done  . TETANUS/TDAP  07/18/2021  . Hepatitis C Screening  Completed  . HIV Screening  Completed    Objective:  Vitals:   09/04/20 0935  BP: (!) 146/90  Pulse: (!) 106  Temp: 97.7 F (36.5 C)  TempSrc: Oral  Weight: 159 lb (72.1 kg)   Body mass index is 25.66 kg/m.  Physical Exam Constitutional:      Comments: He is pleasant and in no distress.  Cardiovascular:     Rate and Rhythm: Normal rate and regular rhythm.     Heart sounds: No murmur heard.   Pulmonary:     Effort: Pulmonary effort is normal.  Abdominal:     General: There is distension.     Palpations: Abdomen is soft.  Musculoskeletal:        General: No swelling or tenderness.  Neurological:     General: No focal deficit present.  Psychiatric:        Mood and Affect: Mood normal.     Lab Results Lab Results  Component Value Date   WBC 3.9 08/11/2020   HGB 15.0 08/11/2020   HCT 44.4 08/11/2020   MCV 97.6 08/11/2020   PLT 241 08/11/2020    Lab Results  Component Value Date   CREATININE 0.99 08/11/2020   BUN 8 08/11/2020   NA 144 08/11/2020   K 4.6 08/11/2020   CL 103 08/11/2020   CO2 30 08/11/2020    Lab Results  Component Value Date   ALT 70 (H) 08/11/2020   AST 128 (H) 08/11/2020   ALKPHOS 62 09/29/2016   BILITOT 0.5 08/11/2020    Lab Results  Component Value Date   CHOL 250 (H) 08/11/2020   HDL 108 08/11/2020   LDLCALC 125 (H) 08/11/2020   TRIG 77 08/11/2020   CHOLHDL 2.3 08/11/2020    Lab Results  Component Value Date   LABRPR NON-REACTIVE 08/11/2020   HIV 1 RNA Quant  Date Value  08/11/2020 8,140 Copies/mL (H)  02/28/2020 75 copies/mL (H)  09/19/2019 31 copies/mL (H)   CD4 T Cell Abs (/uL)  Date Value  08/11/2020 380 (L)  02/28/2020 856  06/25/2019 444     Problem List Items Addressed This Visit      High  Human immunodeficiency virus (HIV) disease (HCC)    If his timeline is correct his HIV viral load remains elevated above 8000 despite having been back on Biktarvy for about 6 weeks.  I will have him continue Biktarvy for now and repeat his viral load today.  He will follow-up in 4 weeks.  He received his second Covid vaccine recently and has had his influenza vaccine.      Relevant Orders   HIV-1 RNA quant-no reflex-bld   Essential hypertension    His blood pressure is poorly controlled.  This is certainly influenced by his heavy alcohol use.  We have encouraged him to go to [facility] [place] for primary care, mental health and addiction counseling.        Unprioritized   ABUSE, ALCOHOL, CONTINUOUS    He has severe, chronic alcohol dependency that is causing significant social, psychological and physical problems for him.  I had him meet with our behavioral health counselor today. We have encouraged him to go to [facility] [place] today for primary care, mental health and addiction counseling.       Depression    He can see a direct line between his anxiety, depression and his alcohol use.  He recalls feeling much better with greatly reduce anxiety and depression when he was sober years ago.  He can also tell a difference when he abstains briefly from alcohol over the past few months. We have encouraged him to go to [facility] [place] for primary care, mental health and addiction counseling.       Transaminitis    This is likely due to his ongoing alcohol use.           Cliffton Asters, MD [facility]ious Disease [facility] [phone] pager   [phone] cell 09/04/2020, 10:18 AM

## 2020-09-04 NOTE — Assessment & Plan Note (Signed)
His blood pressure is poorly controlled.  This is certainly influenced by his heavy alcohol use.  We have encouraged him to go to [facility] [place] for primary care, mental health and addiction counseling.

## 2020-09-04 NOTE — Assessment & Plan Note (Signed)
He has severe, chronic alcohol dependency that is causing significant social, psychological and physical problems for him.  I had him meet with our behavioral health counselor today. We have encouraged him to go to [facility] [place] today for primary care, mental health and addiction counseling.

## 2020-09-15 LAB — HIV-1 RNA QUANT-NO REFLEX-BLD
HIV 1 RNA Quant: 567 Copies/mL — ABNORMAL HIGH
HIV-1 RNA Quant, Log: 2.75 Log cps/mL — ABNORMAL HIGH

## 2020-09-26 ENCOUNTER — Other Ambulatory Visit: Payer: Self-pay | Admitting: Internal Medicine

## 2020-09-26 DIAGNOSIS — I10 Essential (primary) hypertension: Secondary | ICD-10-CM

## 2020-10-01 ENCOUNTER — Ambulatory Visit: Payer: Self-pay | Admitting: Internal Medicine

## 2020-10-16 NOTE — Telephone Encounter (Signed)
Patient returned call to schedule missed appointment with Dr. Orvan Falconer.   Noted that patient was already scheduled for additional lab visit. This lab visit is not needed. Lab appointment canceled and offered phone visit. Patient accepts phone visit.  Darryl Berg

## 2020-10-17 ENCOUNTER — Other Ambulatory Visit: Payer: Self-pay | Admitting: Internal Medicine

## 2020-10-17 DIAGNOSIS — B20 Human immunodeficiency virus [HIV] disease: Secondary | ICD-10-CM

## 2020-10-17 DIAGNOSIS — I10 Essential (primary) hypertension: Secondary | ICD-10-CM

## 2020-10-22 ENCOUNTER — Other Ambulatory Visit: Payer: Self-pay

## 2020-10-22 ENCOUNTER — Telehealth (INDEPENDENT_AMBULATORY_CARE_PROVIDER_SITE_OTHER): Payer: Self-pay | Admitting: Internal Medicine

## 2020-10-22 DIAGNOSIS — B20 Human immunodeficiency virus [HIV] disease: Secondary | ICD-10-CM

## 2020-10-22 NOTE — Progress Notes (Signed)
Virtual Visit via Telephone Note  I connected with Darryl Berg on 10/22/20 at 10:15 AM EST by telephone and verified that I am speaking with the correct person using two identifiers.  Location: Patient: Home Provider: [facility]   I discussed the limitations, risks, security and privacy concerns of performing an evaluation and management service by telephone and the availability of in person appointments. I also discussed with the patient that there may be a patient responsible charge related to this service. The patient expressed understanding and agreed to proceed.   History of Present Illness: I called and spoke with Darryl Berg today.  He denies any problems obtaining, taking or tolerating his Biktarvy or Prezcobix and says he has not missed any doses since his visit here last month.  He says that he is feeling better.  He says that he is cut down on his alcohol intake because he is now working and is only drinking alcohol on weekends.  He says that he is not feeling as depressed.  When he was here last month I was very concerned about impending alcohol withdrawal and worsening depression.  He says that he did go to [facility] the [place] that same day but has not been back.  He has reconnected with one of our bridge counselors.  When I spoke to the counselor yesterday he indicated that he did not think that Darryl Berg actually went to the clinic.   Observations/Objective: HIV 1 RNA Quant  Date Value  09/04/2020 567 Copies/mL (H)  08/11/2020 8,140 Copies/mL (H)  02/28/2020 75 copies/mL (H)   CD4 T Cell Abs (/uL)  Date Value  08/11/2020 380 (L)  02/28/2020 856  06/25/2019 444    Assessment and Plan: His HIV infection is under slightly better control than it was in early December.  Still very concerned about his alcohol dependence, depression and hypertension.  I have spoken to our bridge counselor about that and we will do our best to get him back to [facility] of the [place].  He will  follow-up here for repeat blood work in 6 weeks.  Follow Up Instructions: Continue Biktarvy and Prezcobix and follow-up here in 6 weeks   I discussed the assessment and treatment plan with the patient. The patient was provided an opportunity to ask questions and all were answered. The patient agreed with the plan and demonstrated an understanding of the instructions.   The patient was advised to call back or seek an in-person evaluation if the symptoms worsen or if the condition fails to improve as anticipated.  I provided 18 minutes of non-face-to-face time during this encounter.   Cliffton Asters, MD

## 2020-11-05 ENCOUNTER — Ambulatory Visit: Payer: Self-pay | Admitting: Internal Medicine

## 2020-11-06 ENCOUNTER — Ambulatory Visit: Payer: Self-pay

## 2020-11-11 ENCOUNTER — Other Ambulatory Visit: Payer: Self-pay | Admitting: Internal Medicine

## 2020-11-11 DIAGNOSIS — B20 Human immunodeficiency virus [HIV] disease: Secondary | ICD-10-CM

## 2020-11-11 DIAGNOSIS — I10 Essential (primary) hypertension: Secondary | ICD-10-CM

## 2020-11-28 ENCOUNTER — Other Ambulatory Visit: Payer: Self-pay | Admitting: Internal Medicine

## 2020-11-28 DIAGNOSIS — N529 Male erectile dysfunction, unspecified: Secondary | ICD-10-CM

## 2020-12-02 NOTE — Telephone Encounter (Signed)
Please advise on refill. Has appt on 4/6

## 2020-12-02 NOTE — Telephone Encounter (Signed)
He can have refills.  Darryl Berg

## 2020-12-03 ENCOUNTER — Ambulatory Visit: Payer: Self-pay | Admitting: Internal Medicine

## 2020-12-17 ENCOUNTER — Ambulatory Visit: Payer: Self-pay | Admitting: Internal Medicine

## 2020-12-23 ENCOUNTER — Encounter: Payer: Self-pay | Admitting: Internal Medicine

## 2020-12-31 ENCOUNTER — Ambulatory Visit: Payer: Self-pay | Admitting: Internal Medicine

## 2021-01-01 ENCOUNTER — Ambulatory Visit (INDEPENDENT_AMBULATORY_CARE_PROVIDER_SITE_OTHER): Payer: Self-pay | Admitting: Internal Medicine

## 2021-01-01 ENCOUNTER — Other Ambulatory Visit: Payer: Self-pay

## 2021-01-01 ENCOUNTER — Encounter: Payer: Self-pay | Admitting: Internal Medicine

## 2021-01-01 DIAGNOSIS — B20 Human immunodeficiency virus [HIV] disease: Secondary | ICD-10-CM

## 2021-01-01 DIAGNOSIS — I10 Essential (primary) hypertension: Secondary | ICD-10-CM

## 2021-01-01 DIAGNOSIS — F339 Major depressive disorder, recurrent, unspecified: Secondary | ICD-10-CM

## 2021-01-01 DIAGNOSIS — F1010 Alcohol abuse, uncomplicated: Secondary | ICD-10-CM

## 2021-01-01 NOTE — Assessment & Plan Note (Signed)
His infection was starting to come under better control in January since restarting his antiretroviral therapy.  He will continue Biktarvy and Prezcobix, get repeat lab work today and follow-up in 4 weeks.

## 2021-01-01 NOTE — Progress Notes (Signed)
Patient Active Problem List   Diagnosis Date Noted  . Essential hypertension 07/01/2009    Priority: High  . Human immunodeficiency virus (HIV) disease (HCC) 10/17/2006    Priority: High  . Acute renal insufficiency 09/29/2016  . Gastritis due to alcohol without hemorrhage 09/26/2016  . Alcoholic hepatitis 09/26/2016  . Asymptomatic cholelithiasis 09/26/2016  . Transaminitis 09/25/2016  . 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 ANXIETY 10/17/2006  . ABUSE, ALCOHOL, CONTINUOUS 10/17/2006  . DISORDER, TOBACCO USE 10/17/2006  . Depression 10/17/2006    Patient's Medications  New Prescriptions   No medications on file  Previous Medications   ALBUTEROL (VENTOLIN HFA) 108 (90 BASE) MCG/ACT INHALER    Inhale 2 puffs into the lungs every 2 (two) hours as needed for wheezing or shortness of breath (cough).   BIKTARVY 50-200-25 MG TABS TABLET    TAKE 1 TABLET BY MOUTH DAILY   HYDROCHLOROTHIAZIDE (HYDRODIURIL) 50 MG TABLET    TAKE 1 TABLET BY MOUTH DAILY   OMEPRAZOLE (PRILOSEC) 20 MG CAPSULE    Take 1 capsule (20 mg total) by mouth daily.   PREZCOBIX 800-150 MG TABLET    TAKE 1 TABLET BY MOUTH DAILY WITH BREAKFAST   PROMETHAZINE-DEXTROMETHORPHAN (PROMETHAZINE-DM) 6.25-15 MG/5ML SYRUP    Take 5 mLs by mouth 4 (four) times daily as needed for cough.   TADALAFIL (CIALIS) 10 MG TABLET    TAKE 1 TABLET BY MOUTH ONCE DAILY AS NEEDED FOR ERECTILE DYSFUNCTION  Modified Medications   No medications on file  Discontinued Medications   No medications on file    Subjective: Darryl Berg is in for his routine HIV follow-up visit after missing several visits recently.  He denies any problems obtaining, taking or tolerating his Biktarvy or Prezcobix.  He says that he is using a pillbox that I gave him at the time of his last visit.  He does not recall missing any doses since that visit.  He continues to drink alcohol heavily.  He went to [facility]  of the [place] but says he was told he needed to make an appointment and he never went back.  He is still estranged from his wife and children because of his drinking.  He is happy that he did get a job recently.  He says he is not feeling as depressed.  He says that he has talked to his bridge counselor about getting help for his drinking but does not have a set plan yet.  Review of Systems: Review of Systems  Constitutional: Negative for chills, diaphoresis, fever, malaise/fatigue and weight loss.  HENT: Negative for sore throat.   Respiratory: Negative for cough, sputum production and shortness of breath.   Cardiovascular: Negative for chest pain.  Gastrointestinal: Negative for abdominal pain, diarrhea, heartburn, nausea and vomiting.  Skin: Negative for rash.  Psychiatric/Behavioral: Negative for depression. The patient is not nervous/anxious.     Past Medical History:  Diagnosis Date  . Depression   . HIV disease (HCC)   . Substance abuse (HCC)     Social History   Tobacco Use  . Smoking status: Never Smoker  . Smokeless tobacco: Never Used  Vaping Use  . Vaping Use: Never used  Substance Use Topics  . Alcohol use: Yes    Alcohol/week: 12.0 standard drinks    Types: 12 Standard drinks or equivalent per week    Comment: weekends  . Drug use: No  Family History  Problem Relation Age of Onset  . Hypertension Father     No Known Allergies  Health Maintenance  Topic Date Due  . COLONOSCOPY (Pts 45-65yrs Insurance coverage will need to be confirmed)  Never done  . INFLUENZA VACCINE  03/30/2021  . TETANUS/TDAP  07/18/2021  . Hepatitis C Screening  Completed  . HIV Screening  Completed  . HPV VACCINES  Aged Out    Objective:  Vitals:   01/01/21 1032  BP: (!) 142/93  Pulse: (!) 123  Temp: 98 F (36.7 C)  Weight: 145 lb (65.8 kg)  Height: 5\' 6"  (1.676 m)   Body mass index is 23.4 kg/m.  Physical Exam Constitutional:      Comments: He is calm and  pleasant today.  His weight is down 19 pounds since last August.  Cardiovascular:     Rate and Rhythm: Normal rate and regular rhythm.     Heart sounds: No murmur heard.   Pulmonary:     Effort: Pulmonary effort is normal.     Breath sounds: Normal breath sounds.  Abdominal:     Palpations: Abdomen is soft.     Tenderness: There is no abdominal tenderness.  Musculoskeletal:        General: No swelling or tenderness.  Skin:    Findings: No rash.  Psychiatric:        Mood and Affect: Mood normal.     Lab Results Lab Results  Component Value Date   WBC 3.9 08/11/2020   HGB 15.0 08/11/2020   HCT 44.4 08/11/2020   MCV 97.6 08/11/2020   PLT 241 08/11/2020    Lab Results  Component Value Date   CREATININE 0.99 08/11/2020   BUN 8 08/11/2020   NA 144 08/11/2020   K 4.6 08/11/2020   CL 103 08/11/2020   CO2 30 08/11/2020    Lab Results  Component Value Date   ALT 70 (H) 08/11/2020   AST 128 (H) 08/11/2020   ALKPHOS 62 09/29/2016   BILITOT 0.5 08/11/2020    Lab Results  Component Value Date   CHOL 250 (H) 08/11/2020   HDL 108 08/11/2020   LDLCALC 125 (H) 08/11/2020   TRIG 77 08/11/2020   CHOLHDL 2.3 08/11/2020   Lab Results  Component Value Date   LABRPR NON-REACTIVE 08/11/2020   HIV 1 RNA Quant  Date Value  09/04/2020 567 Copies/mL (H)  08/11/2020 8,140 Copies/mL (H)  02/28/2020 75 copies/mL (H)   CD4 T Cell Abs (/uL)  Date Value  08/11/2020 380 (L)  02/28/2020 856  06/25/2019 444     Problem List Items Addressed This Visit      High   Human immunodeficiency virus (HIV) disease (HCC)    His infection was starting to come under better control in January since restarting his antiretroviral therapy.  He will continue Biktarvy and Prezcobix, get repeat lab work today and follow-up in 4 weeks.      Relevant Orders   T-helper cell (CD4)- ([facility] clinic only)   HIV-1 RNA quant-no reflex-bld   Essential hypertension    I encouraged him to go back to [facility] the [facility] to establish primary care.        Unprioritized   ABUSE, ALCOHOL, CONTINUOUS    He continues to have significant problems directly related to his heavy alcohol drinking.  I asked him to continue to work with his [place] and come up with firm plans about treatment.  They are  considering inpatient therapy at a facility in [place], [place].      Depression    He indicates that he is no longer feeling depressed but I feel like there is likely a component of denial given that he remains estranged from his family.           Cliffton Asters, MD [facility] for Infectious Disease [facility] [phone] pager   [phone] cell 01/01/2021, 11:11 AM

## 2021-01-01 NOTE — Assessment & Plan Note (Signed)
He continues to have significant problems directly related to his heavy alcohol drinking.  I asked him to continue to work with his Paramedic and come up with firm plans about treatment.  They are considering inpatient therapy at a facility in [place], [place].

## 2021-01-01 NOTE — Assessment & Plan Note (Signed)
I encouraged him to go back to [facility] of the [place] to establish primary care.

## 2021-01-01 NOTE — Assessment & Plan Note (Signed)
He indicates that he is no longer feeling depressed but I feel like there is likely a component of denial given that he remains estranged from his family.

## 2021-01-02 LAB — T-HELPER CELL (CD4) - (RCID CLINIC ONLY)
CD4 % Helper T Cell: 23 % — ABNORMAL LOW (ref 33–65)
CD4 T Cell Abs: 466 /uL (ref 400–1790)

## 2021-01-04 LAB — HIV-1 RNA QUANT-NO REFLEX-BLD
HIV 1 RNA Quant: 339 Copies/mL — ABNORMAL HIGH
HIV-1 RNA Quant, Log: 2.53 Log cps/mL — ABNORMAL HIGH

## 2021-01-15 ENCOUNTER — Other Ambulatory Visit: Payer: Self-pay | Admitting: Internal Medicine

## 2021-01-15 DIAGNOSIS — N529 Male erectile dysfunction, unspecified: Secondary | ICD-10-CM

## 2021-01-29 ENCOUNTER — Encounter: Payer: Self-pay | Admitting: Internal Medicine

## 2021-01-29 ENCOUNTER — Telehealth (INDEPENDENT_AMBULATORY_CARE_PROVIDER_SITE_OTHER): Payer: Self-pay | Admitting: Internal Medicine

## 2021-01-29 ENCOUNTER — Other Ambulatory Visit: Payer: Self-pay

## 2021-01-29 DIAGNOSIS — B20 Human immunodeficiency virus [HIV] disease: Secondary | ICD-10-CM

## 2021-01-29 NOTE — Progress Notes (Signed)
Virtual Visit via Telephone Note  I connected with Darryl Berg on 01/29/21 at 10:45 AM EDT by telephone and verified that I am speaking with the correct person using two identifiers.  Location: Patient: Home Provider: [facility]   I discussed the limitations, risks, security and privacy concerns of performing an evaluation and management service by telephone and the availability of in person appointments. I also discussed with the patient that there may be a patient responsible charge related to this service. The patient expressed understanding and agreed to proceed.   History of Present Illness: I called and spoke with Darryl Berg today.  He denies any problems obtaining, taking or tolerating his Biktarvy or Prezcobix and says he has not missed any doses since his last visit here.  He has not gone back to [facility] the [place] as I instructed him to at the time of his last visit.  He needs to establish primary care for his hypertension and ongoing help with his alcohol dependency and depression.   Observations/Objective: HIV 1 RNA Quant (Copies/mL)  Date Value  01/01/2021 339 (H)  09/04/2020 567 (H)  08/11/2020 8,140 (H)   CD4 T Cell Abs (/uL)  Date Value  01/01/2021 466  08/11/2020 380 (L)  02/28/2020 856   Assessment and Plan: His adherence has improved recently and his viral load is coming back under control.  His CD4 count has reconstituted back to normal.  I asked him again to make a commitment to go back to [facility] to establish primary care.  Follow Up Instructions: Continue Biktarvy and Prezcobix Phone follow-up in 1 month   I discussed the assessment and treatment plan with the patient. The patient was provided an opportunity to ask questions and all were answered. The patient agreed with the plan and demonstrated an understanding of the instructions.   The patient was advised to call back or seek an in-person evaluation if the symptoms worsen or if the condition fails to  improve as anticipated.  I provided 16 minutes of non-face-to-face time during this encounter.   Cliffton Asters, MD

## 2021-02-04 IMAGING — CT CT ANGIO CHEST
2 of 6 series · 19 of 46 positions shown · IV contrast (omnipaque)
Comparison: None.

CLINICAL DATA: Chest pain.  Cough.

EXAM:
CT ANGIOGRAPHY CHEST WITH CONTRAST
TECHNIQUE: Multidetector CT imaging of the chest was performed using the
standard protocol during bolus administration of intravenous
contrast. Multiplanar CT image reconstructions and MIPs were
obtained to evaluate the vascular anatomy.
CONTRAST:  100mL OMNIPAQUE IOHEXOL 350 MG/ML SOLN

[Series 6: thins · axial · 0.74mm/px · z∈[-307,-51]mm · 16 of 282 slices shown]
[im 13/282  lung]
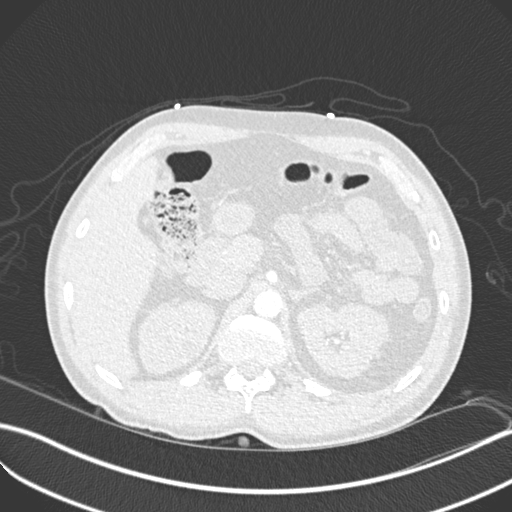
[im 37/282  soft-tissue]
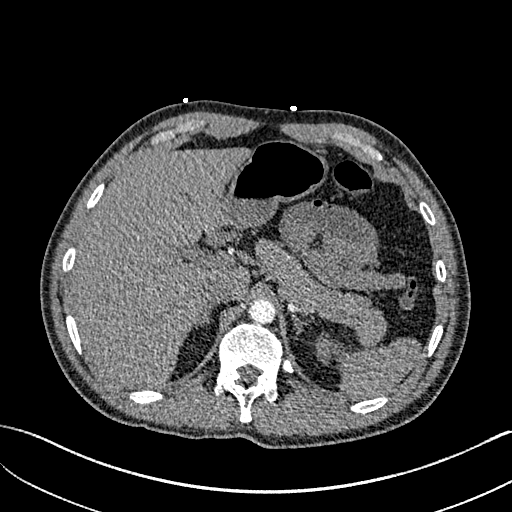
[im 49/282  lung]
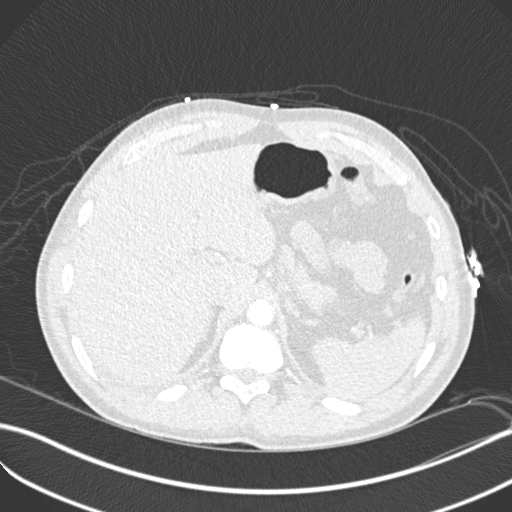
[im 62/282  soft-tissue]
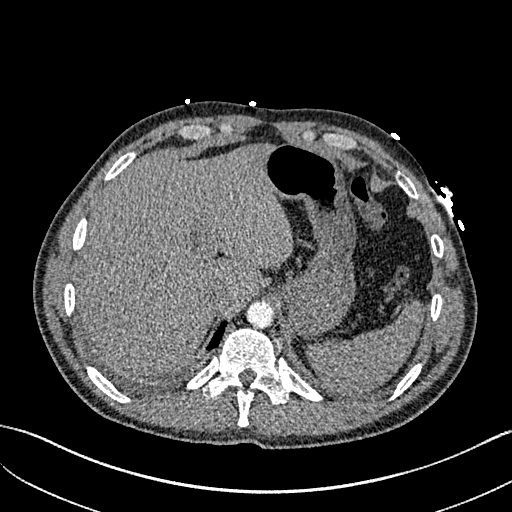
[im 86/282  lung]
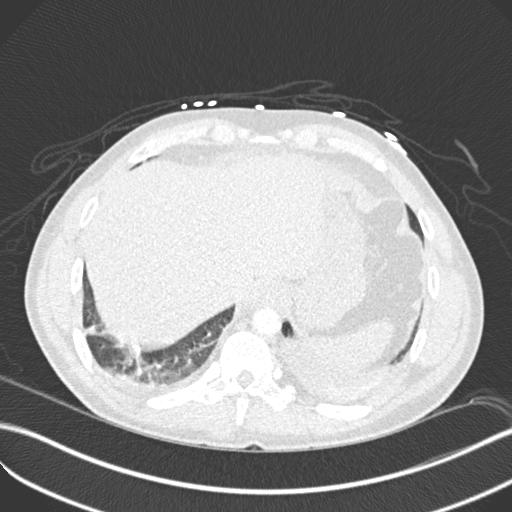
[im 98/282  soft-tissue]
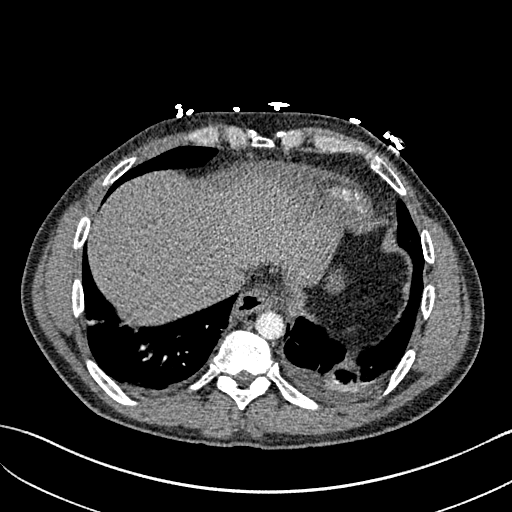
[im 110/282  lung]
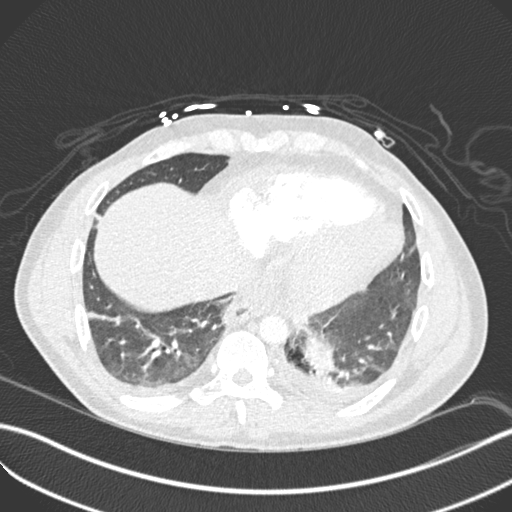
[im 135/282  soft-tissue]
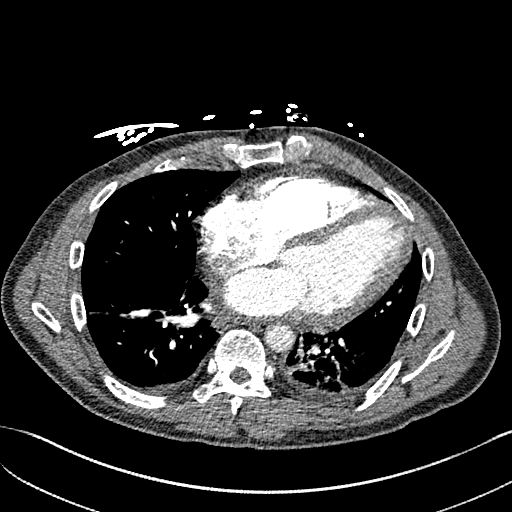
[im 147/282  lung]
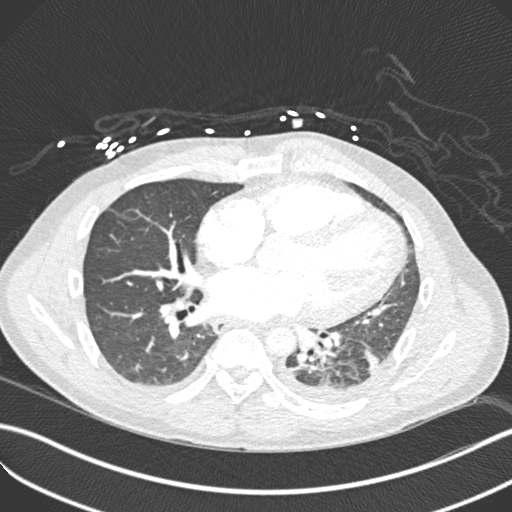
[im 172/282  soft-tissue]
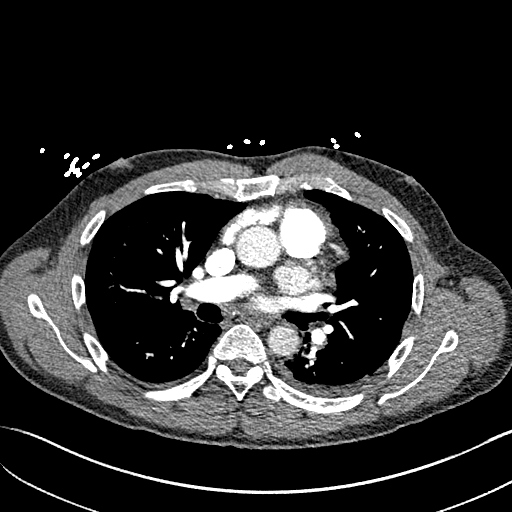
[im 184/282  lung]
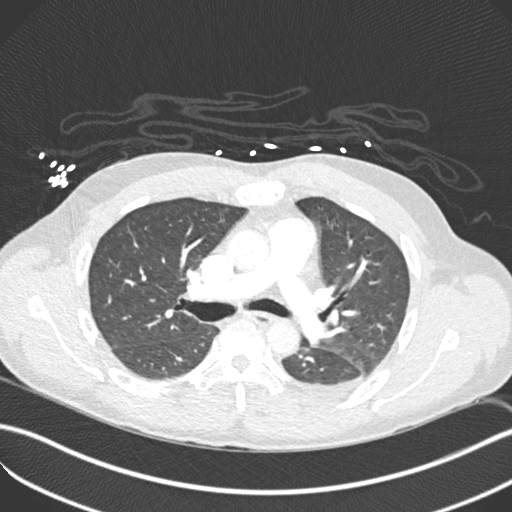
[im 196/282  soft-tissue]
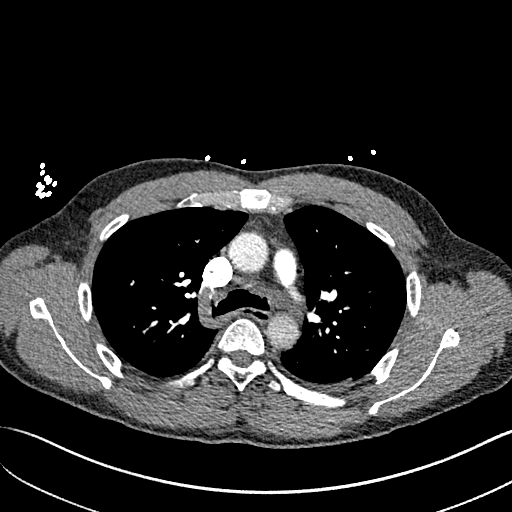
[im 220/282  lung]
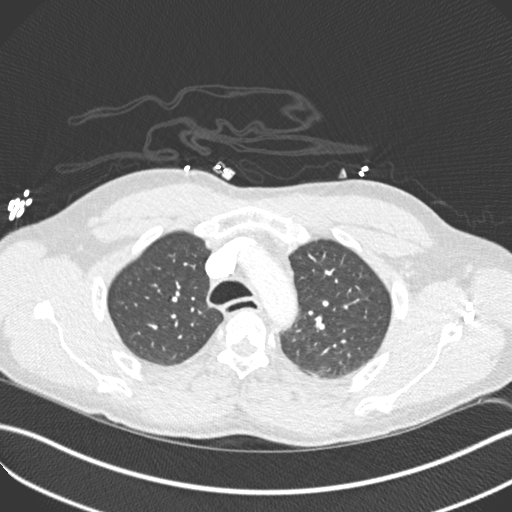
[im 233/282  soft-tissue]
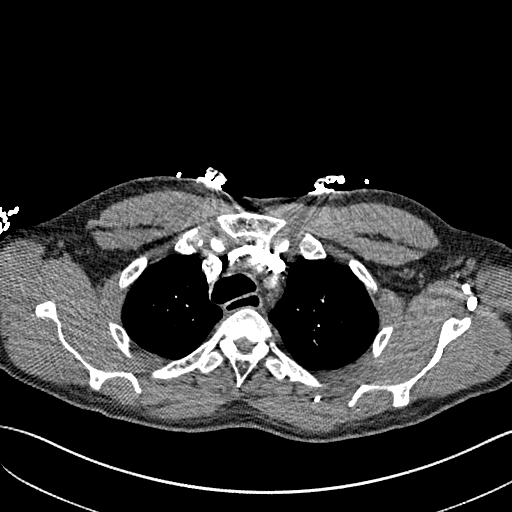
[im 245/282  lung]
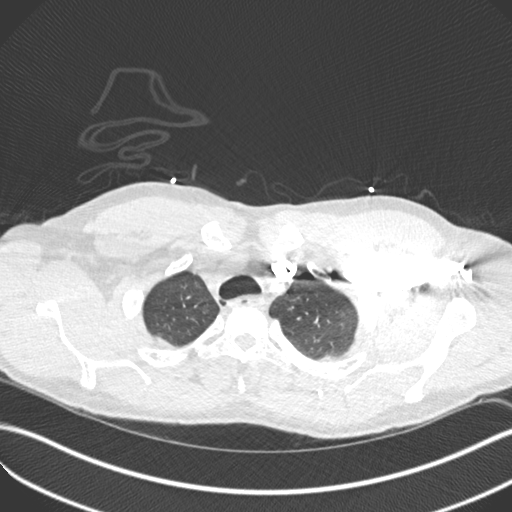
[im 269/282  soft-tissue]
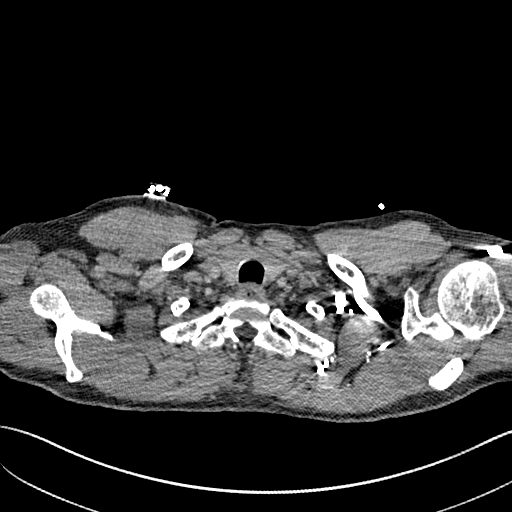

[Series 8: coronal mpr · coronal · 0.59mm/px · 3 of 135 slices shown]
[im 34/135  soft-tissue]
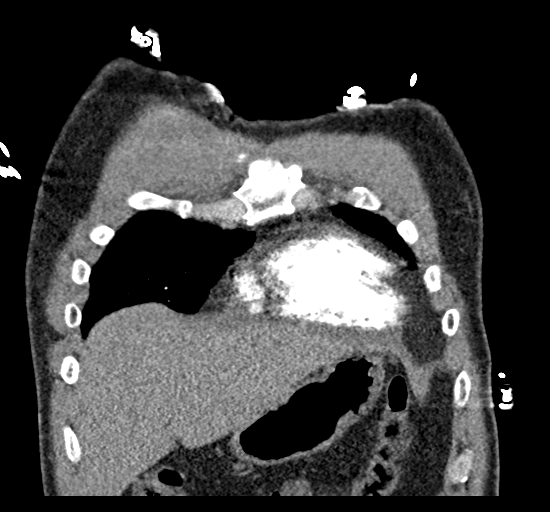
[im 68/135  soft-tissue]
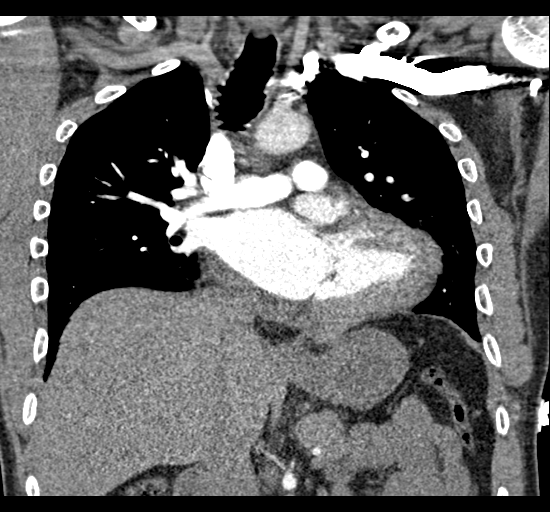
[im 101/135  soft-tissue]
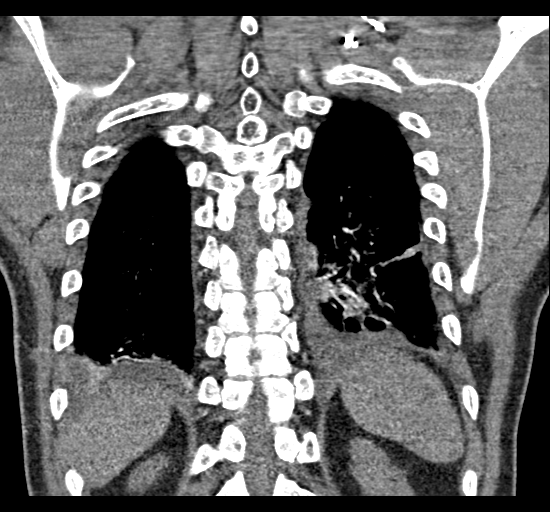

[19 of 46 positions shown; findings below may reference images not displayed]

FINDINGS: Cardiovascular: No filling defects within the pulmonary arteries to
suggest acute pulmonary embolism.

Mediastinum/Nodes: No axillary supraclavicular adenopathy no
mediastinal hilar adenopathy no pericardial effusion.

Lungs/Pleura:

No pulmonary infarction. Atelectasis and small effusions LEFT and
RIGHT lung base. No focal infiltrate.

Upper Abdomen: Limited view of the liver, kidneys, pancreas are
unremarkable. Normal adrenal glands.

Musculoskeletal: No aggressive osseous lead

Review of the MIP images confirms the above findings.
IMPRESSION: 1. No evidence acute pulmonary embolism.
2. Bibasilar atelectasis and small pleural effusions.
3. No evidence pneumonia or infarction

## 2021-02-09 ENCOUNTER — Other Ambulatory Visit: Payer: Self-pay | Admitting: Family

## 2021-02-09 DIAGNOSIS — B20 Human immunodeficiency virus [HIV] disease: Secondary | ICD-10-CM

## 2021-02-26 ENCOUNTER — Telehealth: Payer: Self-pay

## 2021-02-26 ENCOUNTER — Other Ambulatory Visit: Payer: Self-pay | Admitting: Internal Medicine

## 2021-02-26 DIAGNOSIS — N529 Male erectile dysfunction, unspecified: Secondary | ICD-10-CM

## 2021-02-26 NOTE — Telephone Encounter (Signed)
Received interface refill request for  Tadalafil 10 MG Oral Tablet Routing to provider for approval. Valarie Cones

## 2021-02-27 MED ORDER — TADALAFIL 10 MG PO TABS: 10.0000 mg | ORAL_TABLET | Freq: Every day | ORAL | 0 refills | Status: DC | PRN

## 2021-02-27 NOTE — Telephone Encounter (Signed)
Request approved

## 2021-02-27 NOTE — Addendum Note (Signed)
Addended by: Valarie Cones on: 02/27/2021 10:11 AM   Modules accepted: Orders

## 2021-03-03 ENCOUNTER — Encounter: Payer: Self-pay | Admitting: Internal Medicine

## 2021-03-03 ENCOUNTER — Other Ambulatory Visit: Payer: Self-pay

## 2021-03-03 ENCOUNTER — Ambulatory Visit: Payer: Self-pay

## 2021-03-03 ENCOUNTER — Ambulatory Visit (INDEPENDENT_AMBULATORY_CARE_PROVIDER_SITE_OTHER): Payer: Self-pay | Admitting: Internal Medicine

## 2021-03-03 DIAGNOSIS — B20 Human immunodeficiency virus [HIV] disease: Secondary | ICD-10-CM

## 2021-03-03 NOTE — Progress Notes (Signed)
Virtual Visit via Telephone Note  I connected with Darryl Berg on 03/03/21 at  1:45 PM EDT by telephone and verified that I am speaking with the correct person using two identifiers.  Location: Patient: Home Provider: [facility]   I discussed the limitations, risks, security and privacy concerns of performing an evaluation and management service by telephone and the availability of in person appointments. I also discussed with the patient that there may be a patient responsible charge related to this service. The patient expressed understanding and agreed to proceed.   History of Present Illness: I called and spoke with JP today.  He denies any problems obtaining, taking or tolerating his Descovy or Prezcobix.  He said he was about an hour late with his dose last week 1 day when he was at work but has not missed any doses.  He initially told me that he stopped drinking alcohol 1 month ago but then said he is drinking a little bit of alcohol.  He has not been back to [facility] [place] to establish ongoing primary care and behavioral health care like I asked him to.  He told me that he is working with our Paramedic to arrange that but after my phone call with him today our bridge counselor told me that he missed his appointment here today.   Observations/Objective: HIV 1 RNA Quant (Copies/mL)  Date Value  01/01/2021 339 (H)  09/04/2020 567 (H)  08/11/2020 8,140 (H)   CD4 T Cell Abs (/uL)  Date Value  01/01/2021 466  08/11/2020 380 (L)  02/28/2020 856    HIV 1 RNA Quant (Copies/mL)  Date Value  01/01/2021 339 (H)  09/04/2020 567 (H)  08/11/2020 8,140 (H)   CD4 T Cell Abs (/uL)  Date Value  01/01/2021 466  08/11/2020 380 (L)  02/28/2020 856     Assessment and Plan: His adherence has been a little better recently.  He will continue Biktarvy and Prezcobix and come in for lab work in 6 weeks.  He continues to struggle with alcohol dependence and denial.  His  alcohol use has caused exacerbation of his hypertension, depression and has led to him being estranged from his family.  Follow Up Instructions: Continue Biktarvy and Prezcobix Follow-up here for blood work in 6 weeks Her bridge counselor we will follow-up with him   I discussed the assessment and treatment plan with the patient. The patient was provided an opportunity to ask questions and all were answered. The patient agreed with the plan and demonstrated an understanding of the instructions.   The patient was advised to call back or seek an in-person evaluation if the symptoms worsen or if the condition fails to improve as anticipated.  I provided 16 minutes of non-face-to-face time during this encounter.   Cliffton Asters, MD

## 2021-03-05 ENCOUNTER — Encounter: Payer: Self-pay | Admitting: Internal Medicine

## 2021-03-12 ENCOUNTER — Other Ambulatory Visit: Payer: Self-pay

## 2021-03-12 DIAGNOSIS — N529 Male erectile dysfunction, unspecified: Secondary | ICD-10-CM

## 2021-03-12 MED ORDER — TADALAFIL 10 MG PO TABS
10.0000 mg | ORAL_TABLET | Freq: Every day | ORAL | 0 refills | Status: DC | PRN
Start: 2021-03-12 — End: 2021-04-30

## 2021-04-14 ENCOUNTER — Ambulatory Visit: Payer: Self-pay | Admitting: Internal Medicine

## 2021-04-22 ENCOUNTER — Ambulatory Visit: Payer: Self-pay | Admitting: Internal Medicine

## 2021-04-23 ENCOUNTER — Ambulatory Visit (INDEPENDENT_AMBULATORY_CARE_PROVIDER_SITE_OTHER): Payer: Self-pay | Admitting: Internal Medicine

## 2021-04-23 ENCOUNTER — Other Ambulatory Visit: Payer: Self-pay

## 2021-04-23 ENCOUNTER — Encounter: Payer: Self-pay | Admitting: Internal Medicine

## 2021-04-23 DIAGNOSIS — F1010 Alcohol abuse, uncomplicated: Secondary | ICD-10-CM

## 2021-04-23 DIAGNOSIS — I10 Essential (primary) hypertension: Secondary | ICD-10-CM

## 2021-04-23 DIAGNOSIS — U071 COVID-19: Secondary | ICD-10-CM | POA: Insufficient documentation

## 2021-04-23 DIAGNOSIS — B20 Human immunodeficiency virus [HIV] disease: Secondary | ICD-10-CM

## 2021-04-23 MED ORDER — PREZCOBIX 800-150 MG PO TABS: 1.0000 | ORAL_TABLET | Freq: Every day | ORAL | 11 refills | Status: DC

## 2021-04-23 MED ORDER — BIKTARVY 50-200-25 MG PO TABS: 1.0000 | ORAL_TABLET | Freq: Every day | ORAL | 11 refills | Status: DC

## 2021-04-23 NOTE — Assessment & Plan Note (Signed)
I asked him to take his Biktarvy and Prezcobix before going to work and to do his best to not miss any doses.  He will get repeat blood work today and follow-up in 3 months.

## 2021-04-23 NOTE — Assessment & Plan Note (Signed)
I told him that he will need a COVID booster vaccine in a couple of months.

## 2021-04-23 NOTE — Progress Notes (Signed)
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   HYDROCHLOROTHIAZIDE (HYDRODIURIL) 50 MG TABLET    TAKE 1 TABLET BY MOUTH DAILY   OMEPRAZOLE (PRILOSEC) 20 MG CAPSULE    Take 1 capsule (20 mg total) by mouth daily.   PROMETHAZINE-DEXTROMETHORPHAN (PROMETHAZINE-DM) 6.25-15 MG/5ML SYRUP    Take 5 mLs by mouth 4 (four) times daily as needed for cough.   TADALAFIL (CIALIS) 10 MG TABLET    Take 1 tablet (10 mg total) by mouth daily as needed for erectile dysfunction.  Modified Medications   Modified Medication Previous Medication   BICTEGRAVIR-EMTRICITABINE-TENOFOVIR AF (BIKTARVY) 50-200-25 MG TABS TABLET BIKTARVY 50-200-25 MG TABS tablet      Take 1 tablet by mouth daily.    TAKE 1 TABLET BY MOUTH DAILY   DARUNAVIR-COBICISTAT (PREZCOBIX) 800-150 MG TABLET PREZCOBIX 800-150 MG tablet      Take 1 tablet by mouth daily with breakfast.    TAKE 1 TABLET BY MOUTH DAILY WITH BREAKFAST  Discontinued Medications   No medications on file    Subjective: JP is in for his routine HIV follow-up visit after missing several appointments recently.  He denies any  problems obtaining or tolerating his Biktarvy or Prezcobix.  However he has been missing doses.  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  He says that he frequently forgets.  He says that he is drinking alcohol about 2 times each week.  He says that he has had some intermittent depression recently.  He never went to [facility] [place] to establish primary care and counseling.  He is still living in an apartment with a roommate rather than at home with his wife and family.  He recently tested positive for COVID.  He had his first 2 COVID vaccines in January.  Fortunately his illness was mild and transient. Review of Systems: Review of Systems  Constitutional:  Negative for chills, diaphoresis, fever, malaise/fatigue and weight loss.  HENT:  Negative for sore throat.   Respiratory:  Negative for cough, sputum production and shortness of breath.   Cardiovascular:  Negative for chest pain.  Gastrointestinal:  Negative for abdominal pain, diarrhea, heartburn, nausea and vomiting.  Genitourinary:  Negative for dysuria and frequency.  Musculoskeletal:  Negative for joint pain and myalgias.  Skin:  Negative for rash.  Neurological:  Negative for dizziness and headaches.  Psychiatric/Behavioral:  Positive for depression. Negative for substance abuse. The patient is not nervous/anxious.    Past Medical History:  Diagnosis Date   Depression  HIV disease (HCC)    Substance abuse (HCC)     Social History   Tobacco Use   Smoking status: Never   Smokeless tobacco: Never  Vaping Use   Vaping Use: Never used  Substance Use Topics   Alcohol use: Not Currently    Alcohol/week: 12.0 standard drinks    Types: 12 Standard drinks or equivalent per week    Comment: weekends   Drug use: No    Family History  Problem Relation Age of Onset   Hypertension Father     No Known Allergies  Health  Maintenance  Topic Date Due   Pneumococcal Vaccine 9-67 Years old (3 - PCV) 09/26/2017   COLONOSCOPY (Pts 45-20yrs Insurance coverage will need to be confirmed)  Never done   INFLUENZA VACCINE  03/30/2021   TETANUS/TDAP  07/18/2021   Hepatitis C Screening  Completed   HIV Screening  Completed   HPV VACCINES  Aged Out    Objective:  Vitals:   04/23/21 1004  BP: (!) 138/99  Pulse: 89  Temp: 98.7 F (37.1 C)  TempSrc: Oral  Weight: 152 lb (68.9 kg)   Body mass index is 24.53 kg/m.  Physical Exam Constitutional:      General: He is not in acute distress.    Appearance: Normal appearance.  Cardiovascular:     Rate and Rhythm: Normal rate and regular rhythm.     Heart sounds: No murmur heard. Pulmonary:     Effort: Pulmonary effort is normal.     Breath sounds: Normal breath sounds.  Psychiatric:        Mood and Affect: Mood normal.    Lab Results Lab Results  Component Value Date   WBC 3.9 08/11/2020   HGB 15.0 08/11/2020   HCT 44.4 08/11/2020   MCV 97.6 08/11/2020   PLT 241 08/11/2020    Lab Results  Component Value Date   CREATININE 0.99 08/11/2020   BUN 8 08/11/2020   NA 144 08/11/2020   K 4.6 08/11/2020   CL 103 08/11/2020   CO2 30 08/11/2020    Lab Results  Component Value Date   ALT 70 (H) 08/11/2020   AST 128 (H) 08/11/2020   ALKPHOS 62 09/29/2016   BILITOT 0.5 08/11/2020    Lab Results  Component Value Date   CHOL 250 (H) 08/11/2020   HDL 108 08/11/2020   LDLCALC 125 (H) 08/11/2020   TRIG 77 08/11/2020   CHOLHDL 2.3 08/11/2020   Lab Results  Component Value Date   LABRPR NON-REACTIVE 08/11/2020   HIV 1 RNA Quant (Copies/mL)  Date Value  01/01/2021 339 (H)  09/04/2020 567 (H)  08/11/2020 8,140 (H)   CD4 T Cell Abs (/uL)  Date Value  01/01/2021 466  08/11/2020 380 (L)  02/28/2020 856     Problem List Items Addressed This Visit       High   Human immunodeficiency virus (HIV) disease (HCC)    I asked him to take his Biktarvy  and Prezcobix before going to work and to do his best to not miss any doses.  He will get repeat blood work today and follow-up in 3 months.      Relevant Medications   bictegravir-emtricitabine-tenofovir AF (BIKTARVY) 50-200-25 MG TABS tablet   darunavir-cobicistat (PREZCOBIX) 800-150 MG tablet   Other Relevant Orders   T-helper cell (CD4)- ([facility] clinic only)   HIV-1 RNA quant-no reflex-bld   Essential hypertension    His blood pressure remains elevated.  I talked  him again about the importance of establishing primary care.      COVID-19    I told him that he will need a COVID booster vaccine in a couple of months.      Relevant Medications   bictegravir-emtricitabine-tenofovir AF (BIKTARVY) 50-200-25 MG TABS tablet   darunavir-cobicistat (PREZCOBIX) 800-150 MG tablet     Unprioritized   ABUSE, ALCOHOL, CONTINUOUS    I recommended again that he go to [facility] [place] to establish primary care and counseling for his ongoing heavy alcohol use and depression.         Cliffton Asters, MD [facility] for Infectious Disease [facility] [phone] pager   [phone] cell 04/23/2021, 10:22 AM

## 2021-04-23 NOTE — Assessment & Plan Note (Signed)
I recommended again that he go to [facility] [place] to establish primary care and counseling for his ongoing heavy alcohol use and depression.

## 2021-04-23 NOTE — Assessment & Plan Note (Signed)
His blood pressure remains elevated.  I talked him again about the importance of establishing primary care.

## 2021-04-27 LAB — T-HELPER CELL (CD4) - (RCID CLINIC ONLY)
CD4 % Helper T Cell: 26 % — ABNORMAL LOW (ref 33–65)
CD4 T Cell Abs: 412 /uL (ref 400–1790)

## 2021-04-27 LAB — HIV-1 RNA QUANT-NO REFLEX-BLD
HIV 1 RNA Quant: 31 Copies/mL — ABNORMAL HIGH
HIV-1 RNA Quant, Log: 1.48 Log cps/mL — ABNORMAL HIGH

## 2021-04-29 ENCOUNTER — Other Ambulatory Visit: Payer: Self-pay | Admitting: Internal Medicine

## 2021-04-29 DIAGNOSIS — N529 Male erectile dysfunction, unspecified: Secondary | ICD-10-CM

## 2021-04-29 NOTE — Telephone Encounter (Signed)
Ok to refill 

## 2021-05-26 ENCOUNTER — Other Ambulatory Visit: Payer: Self-pay | Admitting: Internal Medicine

## 2021-05-26 DIAGNOSIS — I10 Essential (primary) hypertension: Secondary | ICD-10-CM

## 2021-05-26 NOTE — Telephone Encounter (Signed)
Please advise regarding refill.  

## 2021-06-07 ENCOUNTER — Other Ambulatory Visit: Payer: Self-pay | Admitting: Internal Medicine

## 2021-06-07 DIAGNOSIS — N529 Male erectile dysfunction, unspecified: Secondary | ICD-10-CM

## 2021-07-29 ENCOUNTER — Ambulatory Visit: Payer: Self-pay | Admitting: Internal Medicine

## 2021-09-03 ENCOUNTER — Ambulatory Visit: Payer: Self-pay | Admitting: Internal Medicine

## 2021-09-03 ENCOUNTER — Other Ambulatory Visit: Payer: Self-pay | Admitting: Internal Medicine

## 2021-09-03 DIAGNOSIS — N529 Male erectile dysfunction, unspecified: Secondary | ICD-10-CM

## 2021-09-03 NOTE — Telephone Encounter (Signed)
Okay to refill? 

## 2021-09-08 ENCOUNTER — Other Ambulatory Visit: Payer: Self-pay | Admitting: Internal Medicine

## 2021-09-08 DIAGNOSIS — N529 Male erectile dysfunction, unspecified: Secondary | ICD-10-CM

## 2021-09-09 ENCOUNTER — Other Ambulatory Visit: Payer: Self-pay | Admitting: Internal Medicine

## 2021-09-09 DIAGNOSIS — I10 Essential (primary) hypertension: Secondary | ICD-10-CM

## 2021-09-09 NOTE — Telephone Encounter (Signed)
Ok to refill 

## 2021-09-09 NOTE — Telephone Encounter (Signed)
Do you want to refill? He called earlier today to make an appointment for 1/18

## 2021-09-16 ENCOUNTER — Ambulatory Visit (INDEPENDENT_AMBULATORY_CARE_PROVIDER_SITE_OTHER): Payer: Self-pay | Admitting: Internal Medicine

## 2021-09-16 ENCOUNTER — Other Ambulatory Visit: Payer: Self-pay

## 2021-09-16 ENCOUNTER — Other Ambulatory Visit: Payer: Self-pay | Admitting: Internal Medicine

## 2021-09-16 VITALS — Wt 145.0 lb

## 2021-09-16 DIAGNOSIS — F1010 Alcohol abuse, uncomplicated: Secondary | ICD-10-CM

## 2021-09-16 DIAGNOSIS — Z23 Encounter for immunization: Secondary | ICD-10-CM

## 2021-09-16 DIAGNOSIS — I10 Essential (primary) hypertension: Secondary | ICD-10-CM

## 2021-09-16 DIAGNOSIS — N529 Male erectile dysfunction, unspecified: Secondary | ICD-10-CM

## 2021-09-16 DIAGNOSIS — B20 Human immunodeficiency virus [HIV] disease: Secondary | ICD-10-CM

## 2021-09-16 MED ORDER — PREZCOBIX 800-150 MG PO TABS: 1.0000 | ORAL_TABLET | Freq: Every day | ORAL | 11 refills | Status: DC

## 2021-09-16 MED ORDER — BIKTARVY 50-200-25 MG PO TABS: 1.0000 | ORAL_TABLET | Freq: Every day | ORAL | 11 refills | Status: DC

## 2021-09-16 NOTE — Assessment & Plan Note (Signed)
It seems as though his adherence has improved over the past year and his viral load has started to suppress again.  He will continue his current antiretroviral regimen, get repeat blood work today and follow-up in 6 months.  He received his annual influenza vaccine today.  He refused a COVID booster vaccine.

## 2021-09-16 NOTE — Assessment & Plan Note (Addendum)
Encouraged him, again, to go to [facility] to get help for his chronic depression and alcohol use disorder.  Addendum: Darryl Berg met with our behavioral health counselor and bridge counselor here today.  He appears to be more motivated to try to quit drinking alcohol.  He has been showing some signs of withdrawal.  I am prescribing naltrexone and we are hoping to get him to [facility] and encouraged him to enter [facility], and inpatient drug and alcohol treatment facility in [place].  He will follow-up with me early next week.

## 2021-09-16 NOTE — Assessment & Plan Note (Signed)
I encouraged him, again, to go to [facility] to establish ongoing primary care.
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
## 2021-09-17 ENCOUNTER — Encounter: Payer: Self-pay | Admitting: Internal Medicine

## 2021-09-17 LAB — T-HELPER CELL (CD4) - (RCID CLINIC ONLY)
CD4 % Helper T Cell: 19 % — ABNORMAL LOW (ref 33–65)
CD4 T Cell Abs: 349 /uL — ABNORMAL LOW (ref 400–1790)

## 2021-09-17 NOTE — Telephone Encounter (Signed)
Okay to refill? I see that you saw him yesterday and encouraged primary care

## 2021-09-18 LAB — HIV-1 RNA QUANT-NO REFLEX-BLD

## 2021-09-18 LAB — RPR

## 2021-09-18 LAB — LIPID PANEL

## 2021-09-18 LAB — COMPREHENSIVE METABOLIC PANEL

## 2021-09-18 LAB — CBC

## 2021-09-19 LAB — COMPREHENSIVE METABOLIC PANEL
AG Ratio: 1.4 (calc) (ref 1.0–2.5)
ALT: 40 U/L (ref 9–46)
AST: 62 U/L — ABNORMAL HIGH (ref 10–40)
Albumin: 5 g/dL (ref 3.6–5.1)
Alkaline phosphatase (APISO): 49 U/L (ref 36–130)
BUN: 13 mg/dL (ref 7–25)
CO2: 32 mmol/L (ref 20–32)
Calcium: 10.1 mg/dL (ref 8.6–10.3)
Chloride: 95 mmol/L — ABNORMAL LOW (ref 98–110)
Creat: 1.11 mg/dL (ref 0.60–1.29)
Globulin: 3.7 g/dL (calc) (ref 1.9–3.7)
Glucose, Bld: 125 mg/dL — ABNORMAL HIGH (ref 65–99)
Potassium: 3.9 mmol/L (ref 3.5–5.3)
Sodium: 139 mmol/L (ref 135–146)
Total Bilirubin: 0.7 mg/dL (ref 0.2–1.2)
Total Protein: 8.7 g/dL — ABNORMAL HIGH (ref 6.1–8.1)

## 2021-09-19 LAB — LIPID PANEL
Cholesterol: 255 mg/dL — ABNORMAL HIGH (ref <200)
HDL: 111 mg/dL (ref >=40)
LDL Cholesterol (Calc): 108 mg/dL (calc) — ABNORMAL HIGH
Non-HDL Cholesterol (Calc): 144 mg/dL (calc) — ABNORMAL HIGH (ref <130)
Total CHOL/HDL Ratio: 2.3 (calc) (ref <5.0)
Triglycerides: 239 mg/dL — ABNORMAL HIGH (ref <150)

## 2021-09-19 LAB — CBC
HCT: 45.4 % (ref 38.5–50.0)
Hemoglobin: 16 g/dL (ref 13.2–17.1)
MCH: 36 pg — ABNORMAL HIGH (ref 27.0–33.0)
MCHC: 35.2 g/dL (ref 32.0–36.0)
MCV: 102 fL — ABNORMAL HIGH (ref 80.0–100.0)
MPV: 9.4 fL (ref 7.5–12.5)
Platelets: 238 10*3/uL (ref 140–400)
RBC: 4.45 10*6/uL (ref 4.20–5.80)
RDW: 14 % (ref 11.0–15.0)
WBC: 3.9 10*3/uL (ref 3.8–10.8)

## 2021-09-19 LAB — HIV-1 RNA QUANT-NO REFLEX-BLD
HIV 1 RNA Quant: 5140 Copies/mL — ABNORMAL HIGH
HIV-1 RNA Quant, Log: 3.71 Log cps/mL — ABNORMAL HIGH

## 2021-09-19 LAB — RPR: RPR Ser Ql: NONREACTIVE

## 2021-09-24 ENCOUNTER — Ambulatory Visit: Payer: Self-pay

## 2021-09-24 ENCOUNTER — Other Ambulatory Visit: Payer: Self-pay

## 2021-09-24 ENCOUNTER — Telehealth: Payer: Self-pay | Admitting: Internal Medicine

## 2021-09-24 MED ORDER — NALTREXONE HCL 50 MG PO TABS: 50.0000 mg | ORAL_TABLET | Freq: Every day | ORAL | 2 refills | Status: DC

## 2021-09-24 NOTE — Addendum Note (Signed)
Addended by: Cliffton Asters on: 09/24/2021 10:02 AM   Modules accepted: Orders

## 2021-09-24 NOTE — Progress Notes (Unsigned)
Therapist met with client as scheduled for the purpose of completing a (review) of the comprehensive clinical assessment. Therapist used motivational interviewing to gather information regarding clients current and previous mental health history. Therapist also inquired about client's goals, strengths and weaknesses, in addition to social determinants of health. Therapist identified current stressors and risk factors, in addition to using other applicable clinical tools for assessment and recommendation. Therapist reviewed clinical diagnosis with client and processed various ways to move forward to alleviate/reduce mental health symptoms. Therapist assessed for SI/HI.  Client met with therapist as scheduled for the purpose of completing a comprehensive clinical assessment. Appearance was relaxed/casual, and attitude was appropriate and casual. Client was guarded and was reluctant to provide necessary information needed to complete assessment. Client and clinician processed client's level of insight (syntonic versus dystonic) toward the presenting problems and how he demonstrates insight into the problematic nature of the described behavior; whether he agrees with others,' concern and or lack thereof. Clinician also gaged client's level of motivation to work on change. Client reported symptoms that were conducive to their diagnosis and seemed receptive to therapeutic goals. Client denied SI/HI.

## 2021-09-24 NOTE — Telephone Encounter (Signed)
*  Caution - External email - see footer for warnings* He got his Nalterxone - he got to [employer] and they wanted $115 - he called me in a panic - I was driving up [facility]. to see someone else and popped in and got it for him - they did reduce it to $37 - with a discount card - and [employer] paid for it.  (I wish they just did a discount card for everyone - they see me and usually do that - since they see me soooo often)  I am worried - he thinks this will solve his problem - I told him it is a PIECE of the puzzle - he needs to continue to see Okey Regal as scheduled - and to consider doing meetings - and the recommendation is always 90 meetings in 90 days - he said ok.  I then stressed if you try this for a couple of weeks - and you are really struggling - let's talk more about residential treatment.  He agreed.  I think the fact I could make this happen today will be helpful to once again reinforce our work relationship.  Thanks!  [employer]

## 2021-09-25 ENCOUNTER — Ambulatory Visit: Payer: Self-pay

## 2021-09-29 ENCOUNTER — Ambulatory Visit: Payer: Self-pay | Admitting: Internal Medicine

## 2021-10-08 ENCOUNTER — Ambulatory Visit: Payer: Self-pay

## 2021-11-26 ENCOUNTER — Telehealth: Payer: Self-pay | Admitting: Internal Medicine
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
## 2022-02-04 ENCOUNTER — Telehealth: Payer: Self-pay

## 2022-02-04 NOTE — Telephone Encounter (Signed)
Received call from [facility] reporting that patient has not received medication since March. Asked them to please continue dispensing his medication.   Called JP to discuss/counsel adherence. No answer. Left HIPAA compliant voicemail requesting callback.   Sandie Ano, RN

## 2022-03-17 ENCOUNTER — Other Ambulatory Visit: Payer: Self-pay

## 2022-03-17 ENCOUNTER — Ambulatory Visit (INDEPENDENT_AMBULATORY_CARE_PROVIDER_SITE_OTHER): Payer: Self-pay | Admitting: Internal Medicine

## 2022-03-17 ENCOUNTER — Encounter: Payer: Self-pay | Admitting: Internal Medicine

## 2022-03-17 DIAGNOSIS — B20 Human immunodeficiency virus [HIV] disease: Secondary | ICD-10-CM

## 2022-03-17 DIAGNOSIS — F3342 Major depressive disorder, recurrent, in full remission: Secondary | ICD-10-CM

## 2022-03-17 DIAGNOSIS — I10 Essential (primary) hypertension: Secondary | ICD-10-CM

## 2022-03-17 NOTE — Assessment & Plan Note (Signed)
His blood pressure is under much better control.

## 2022-03-17 NOTE — Assessment & Plan Note (Signed)
Not surprisingly, his depression has gone into remission now that he is drinking less alcohol.

## 2022-03-17 NOTE — Assessment & Plan Note (Signed)
His adherence with his HIV medications has improved recently coincident with drinking less alcohol and having close oversight from our bridge counselor.  I will update his lab work today and see him back in 3 months.

## 2022-03-17 NOTE — Progress Notes (Signed)
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   BICTEGRAVIR-EMTRICITABINE-TENOFOVIR AF (BIKTARVY) 50-200-25 MG TABS TABLET    Take 1 tablet by mouth daily.   DARUNAVIR-COBICISTAT (PREZCOBIX) 800-150 MG TABLET    Take 1 tablet by mouth daily with breakfast.   HYDROCHLOROTHIAZIDE (HYDRODIURIL) 50 MG TABLET    TAKE 1 TABLET BY MOUTH DAILY   NALTREXONE (DEPADE) 50 MG TABLET    Take 1 tablet (50 mg total) by mouth daily.   OMEPRAZOLE (PRILOSEC) 20 MG CAPSULE    Take 1 capsule (20 mg total) by mouth daily.   PROMETHAZINE-DEXTROMETHORPHAN (PROMETHAZINE-DM) 6.25-15 MG/5ML SYRUP    Take 5 mLs by mouth 4 (four) times daily as needed for cough.   TADALAFIL (CIALIS) 10 MG TABLET    TAKE 1 TABLET BY MOUTH ONCE DAILY AS NEEDED FOR ERECTILE DYSFUNCTION  Modified Medications   No medications on file  Discontinued Medications   No medications on file    Subjective: Darryl Berg is in for his routine HIV follow-up visit.  He denies any problems obtaining, taking or tolerating his Biktarvy or Prezcobix.  He has a pillbox but it does not sound like he is using it  consistently.  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  He tends to take his HIV medicines and hydrochlorothiazide in the morning and his naltrexone at bedtime because it makes him sleepy.  He says that he is not drinking nearly as much alcohol.  He is feeling better and he says that his family is happier with him.  He lost his ID and immigration papers so he has not been able to get a PCP.  Review of Systems: Review of Systems  Constitutional:  Negative for fever and weight loss.  Psychiatric/Behavioral:  Negative for depression.     Past Medical History:  Diagnosis Date   Depression    HIV disease (HCC)    Substance abuse (HCC)     Social History   Tobacco Use   Smoking status: Never   Smokeless tobacco: Never  Vaping Use   Vaping Use: Never used  Substance Use Topics   Alcohol use: Not Currently    Alcohol/week: 12.0 standard drinks of alcohol    Types: 12 Standard drinks or equivalent per week    Comment: weekends   Drug use: No    Family History  Problem Relation Age of Onset   Hypertension Father     No Known Allergies  Health Maintenance  Topic Date Due   COLONOSCOPY (Pts 45-29yrs Insurance coverage will need to be confirmed)  Never done  TETANUS/TDAP  07/18/2021   INFLUENZA VACCINE  03/30/2022   Hepatitis C Screening  Completed   HIV Screening  Completed   HPV VACCINES  Aged Out    Objective:  Vitals:   03/17/22 0921  BP: 135/81  Pulse: 91  Temp: 97.9 F (36.6 C)  TempSrc: Temporal  Weight: 143 lb (64.9 kg)   Body mass index is 23.08 kg/m.  Physical Exam Constitutional:      Comments: His spirits are good today.  Cardiovascular:     Rate and Rhythm: Normal rate and regular rhythm.     Heart sounds: No murmur heard. Pulmonary:     Effort: Pulmonary effort is normal.     Breath sounds: Normal breath sounds.  Psychiatric:        Mood and Affect: Mood normal.     Lab Results Lab Results  Component Value Date    WBC 3.9 09/16/2021   WBC CANCELED 09/16/2021   HGB 16.0 09/16/2021   HCT 45.4 09/16/2021   MCV 102.0 (H) 09/16/2021   PLT 238 09/16/2021    Lab Results  Component Value Date   CREATININE 1.11 09/16/2021   BUN 13 09/16/2021   NA 139 09/16/2021   K 3.9 09/16/2021   CL 95 (L) 09/16/2021   CO2 32 09/16/2021    Lab Results  Component Value Date   ALT 40 09/16/2021   AST 62 (H) 09/16/2021   ALKPHOS 62 09/29/2016   BILITOT 0.7 09/16/2021    Lab Results  Component Value Date   CHOL 255 (H) 09/16/2021   HDL 111 09/16/2021   LDLCALC 108 (H) 09/16/2021   LDLCALC CANCELED 09/16/2021   TRIG 239 (H) 09/16/2021   CHOLHDL 2.3 09/16/2021   Lab Results  Component Value Date   LABRPR NON-REACTIVE 09/16/2021   LABRPR CANCELED 09/16/2021   HIV 1 RNA Quant  Date Value  09/16/2021 5,140 Copies/mL (H)  09/16/2021 CANCELED  04/23/2021 31 Copies/mL (H)   CD4 T Cell Abs (/uL)  Date Value  09/16/2021 349 (L)  04/23/2021 412  01/01/2021 466     Problem List Items Addressed This Visit       High   Human immunodeficiency virus (HIV) disease (HCC)    His adherence with his HIV medications has improved recently coincident with drinking less alcohol and having close oversight from our bridge counselor.  I will update his lab work today and see him back in 3 months.      Relevant Orders   T-helper cells (CD4) count (not at [facility])   HIV-1 RNA quant-no reflex-bld   Essential hypertension    His blood pressure is under much better control.        Unprioritized   Depression    Not surprisingly, his depression has gone into remission now that he is drinking less alcohol.         Cliffton Asters, MD [facility] [phone] pager   [phone] cell 03/17/2022, 9:38 AM

## 2022-03-18 LAB — T-HELPER CELLS (CD4) COUNT (NOT AT ARMC)
CD4 % Helper T Cell: 19 % — ABNORMAL LOW (ref 33–65)
CD4 T Cell Abs: 389 /uL — ABNORMAL LOW (ref 400–1790)

## 2022-03-19 ENCOUNTER — Other Ambulatory Visit: Payer: Self-pay | Admitting: Internal Medicine

## 2022-03-19 DIAGNOSIS — I10 Essential (primary) hypertension: Secondary | ICD-10-CM

## 2022-03-19 DIAGNOSIS — B20 Human immunodeficiency virus [HIV] disease: Secondary | ICD-10-CM

## 2022-03-22 LAB — HIV-1 RNA QUANT-NO REFLEX-BLD
HIV 1 RNA Quant: 709 Copies/mL — ABNORMAL HIGH
HIV-1 RNA Quant, Log: 2.85 Log cps/mL — ABNORMAL HIGH

## 2022-06-02 ENCOUNTER — Other Ambulatory Visit: Payer: Self-pay

## 2022-06-02 DIAGNOSIS — Z113 Encounter for screening for infections with a predominantly sexual mode of transmission: Secondary | ICD-10-CM

## 2022-06-02 DIAGNOSIS — B20 Human immunodeficiency virus [HIV] disease: Secondary | ICD-10-CM

## 2022-06-03 ENCOUNTER — Other Ambulatory Visit: Payer: Self-pay

## 2022-06-17 ENCOUNTER — Ambulatory Visit: Payer: Self-pay | Admitting: Internal Medicine

## 2022-06-17 ENCOUNTER — Telehealth: Payer: Self-pay | Admitting: Internal Medicine

## 2022-06-17 NOTE — Telephone Encounter (Signed)
I can't reach him on the phone - no response to text reminder today - today - the phone goes unanswered.    That means a no show for today I guess.  (Unless he suddenly calls)  He was doing better - then lost a job and it has been a downward spiral again...  We shall see what happens.   [facility] [address], [place], [place] [address]  Office [phone] Work cell [phone]

## 2022-07-01 ENCOUNTER — Ambulatory Visit: Payer: Self-pay | Admitting: Internal Medicine

## 2022-07-01 ENCOUNTER — Emergency Department (HOSPITAL_COMMUNITY)
Admission: EM | Admit: 2022-07-01 | Discharge: 2022-07-01 | Discharge status: Against Medical Advice | Payer: Self-pay | Attending: Emergency Medicine | Admitting: Emergency Medicine

## 2022-07-01 ENCOUNTER — Encounter (HOSPITAL_COMMUNITY): Payer: Self-pay

## 2022-07-01 ENCOUNTER — Other Ambulatory Visit: Payer: Self-pay

## 2022-07-01 DIAGNOSIS — Z21 Asymptomatic human immunodeficiency virus [HIV] infection status: Secondary | ICD-10-CM | POA: Insufficient documentation

## 2022-07-01 DIAGNOSIS — Y908 Blood alcohol level of 240 mg/100 ml or more: Secondary | ICD-10-CM | POA: Insufficient documentation

## 2022-07-01 DIAGNOSIS — K920 Hematemesis: Secondary | ICD-10-CM | POA: Insufficient documentation

## 2022-07-01 DIAGNOSIS — I10 Essential (primary) hypertension: Secondary | ICD-10-CM | POA: Insufficient documentation

## 2022-07-01 LAB — CBC WITH DIFFERENTIAL/PLATELET
Abs Immature Granulocytes: 0.02 10*3/uL (ref 0.00–0.07)
Basophils Absolute: 0 10*3/uL (ref 0.0–0.1)
Basophils Relative: 1 %
Eosinophils Absolute: 0.1 10*3/uL (ref 0.0–0.5)
Eosinophils Relative: 3 %
HCT: 41.2 % (ref 39.0–52.0)
Hemoglobin: 14 g/dL (ref 13.0–17.0)
Immature Granulocytes: 1 %
Lymphocytes Relative: 51 %
Lymphs Abs: 1.7 10*3/uL (ref 0.7–4.0)
MCH: 35.1 pg — ABNORMAL HIGH (ref 26.0–34.0)
MCHC: 34 g/dL (ref 30.0–36.0)
MCV: 103.3 fL — ABNORMAL HIGH (ref 80.0–100.0)
Monocytes Absolute: 0.4 10*3/uL (ref 0.1–1.0)
Monocytes Relative: 13 %
Neutro Abs: 1 10*3/uL — ABNORMAL LOW (ref 1.7–7.7)
Neutrophils Relative %: 31 %
Platelets: 165 10*3/uL (ref 150–400)
RBC: 3.99 MIL/uL — ABNORMAL LOW (ref 4.22–5.81)
RDW: 14.1 % (ref 11.5–15.5)
WBC: 3.2 10*3/uL — ABNORMAL LOW (ref 4.0–10.5)
nRBC: 0 % (ref 0.0–0.2)

## 2022-07-01 LAB — COMPREHENSIVE METABOLIC PANEL
ALT: 47 U/L — ABNORMAL HIGH (ref 0–44)
AST: 82 U/L — ABNORMAL HIGH (ref 15–41)
Albumin: 4.1 g/dL (ref 3.5–5.0)
Alkaline Phosphatase: 49 U/L (ref 38–126)
Anion gap: 11 (ref 5–15)
BUN: 11 mg/dL (ref 6–20)
CO2: 26 mmol/L (ref 22–32)
Calcium: 9.1 mg/dL (ref 8.9–10.3)
Chloride: 105 mmol/L (ref 98–111)
Creatinine, Ser: 0.88 mg/dL (ref 0.61–1.24)
GFR, Estimated: >60 mL/min (ref >60)
Glucose, Bld: 104 mg/dL — ABNORMAL HIGH (ref 70–99)
Potassium: 3.8 mmol/L (ref 3.5–5.1)
Sodium: 142 mmol/L (ref 135–145)
Total Bilirubin: 0.5 mg/dL (ref 0.3–1.2)
Total Protein: 7.9 g/dL (ref 6.5–8.1)

## 2022-07-01 LAB — PROTIME-INR
INR: 1 (ref 0.8–1.2)
Prothrombin Time: 13.1 seconds (ref 11.4–15.2)

## 2022-07-01 LAB — TYPE AND SCREEN
ABO/RH(D): A POS
Antibody Screen: NEGATIVE

## 2022-07-01 LAB — APTT: aPTT: 26 seconds (ref 24–36)

## 2022-07-01 LAB — ABO/RH: ABO/RH(D): A POS

## 2022-07-01 LAB — LIPASE, BLOOD: Lipase: 60 U/L — ABNORMAL HIGH (ref 11–51)

## 2022-07-01 LAB — ETHANOL: Alcohol, Ethyl (B): 379 mg/dL (ref <10)

## 2022-07-01 MED ORDER — ONDANSETRON HCL 4 MG/2ML IJ SOLN
4.0000 mg | Freq: Once | INTRAMUSCULAR | Status: DC
  Filled 2022-07-01: qty 2

## 2022-07-01 MED ORDER — PANTOPRAZOLE INFUSION (NEW) - SIMPLE MED
8.0000 mg/h | INTRAVENOUS | Status: DC
  Filled 2022-07-01: qty 100

## 2022-07-01 MED ORDER — SODIUM CHLORIDE 0.9 % IV BOLUS: 1000.0000 mL | Freq: Once | INTRAVENOUS | Status: DC

## 2022-07-01 MED ORDER — PANTOPRAZOLE 80MG IVPB - SIMPLE MED
80.0000 mg | Freq: Once | INTRAVENOUS | Status: DC
  Filled 2022-07-01: qty 100

## 2022-07-01 MED ORDER — PANTOPRAZOLE SODIUM 40 MG IV SOLR: 40.0000 mg | Freq: Two times a day (BID) | INTRAVENOUS | Status: DC

## 2022-07-01 NOTE — ED Notes (Signed)
Went to pt room to start iv and pt said that he was leaving ama bc he has to pick up his daughter and a a doctors appt in the morning. PA updated. Pt signed ama form

## 2022-07-01 NOTE — ED Triage Notes (Signed)
Arrives EMS from home. Sts he woke up with palpitations and began vomiting blood.   Several episodes of bloody vomit witnessed by EMS.   Has been drinking fireball tonight.

## 2022-07-01 NOTE — ED Provider Notes (Signed)
[facility] EMERGENCY DEPARTMENT Provider Note   CSN: [id number] Arrival date & time: 07/01/22  0146     History  Chief Complaint  Patient presents with   Hematemesis    Darryl Berg is a 46 y.o. male.  The history is provided by the patient and medical records.    46 y.o. M with hx of alcohol abuse, anxiety, depression, HTN, gastritis, HIV, hx TB, presenting to the ED for hematemesis.  He reports he began vomiting blood tonight, several episodes witnessed by EMS.  He denies hx of same but does report history of stomach ulcers.  He is not on anticoagulation.  He thinks it may be from eating [employer] for lunch.  EMS reports he was drinking large quantities of fireball tonight.  Reports still has some nausea but improved from prior.  Denies recent bloody bowel movements, etc.  Home Medications Prior to Admission medications   Medication Sig Start Date End Date Taking? Authorizing Provider  albuterol (VENTOLIN HFA) 108 (90 Base) MCG/ACT inhaler Inhale 2 puffs into the lungs every 2 (two) hours as needed for wheezing or shortness of breath (cough). 08/17/19   Muthersbaugh, Jarrett Soho, PA-C  bictegravir-emtricitabine-tenofovir AF (BIKTARVY) 50-200-25 MG TABS tablet Take 1 tablet by mouth daily. 09/16/21   Michel Bickers, MD  darunavir-cobicistat (PREZCOBIX) 800-150 MG tablet Take 1 tablet by mouth daily with breakfast. 09/16/21   Michel Bickers, MD  hydrochlorothiazide (HYDRODIURIL) 50 MG tablet TAKE 1 TABLET BY MOUTH DAILY 09/17/21   Michel Bickers, MD  naltrexone (DEPADE) 50 MG tablet Take 1 tablet (50 mg total) by mouth daily. 09/24/21   Michel Bickers, MD  omeprazole (PRILOSEC) 20 MG capsule Take 1 capsule (20 mg total) by mouth daily. 09/26/16   Asencion Partridge, MD  promethazine-dextromethorphan (PROMETHAZINE-DM) 6.25-15 MG/5ML syrup Take 5 mLs by mouth 4 (four) times daily as needed for cough. Patient not taking: Reported on 04/23/2021 08/17/19   Muthersbaugh, Jarrett Soho, PA-C   tadalafil (CIALIS) 10 MG tablet TAKE 1 TABLET BY MOUTH ONCE DAILY AS NEEDED FOR ERECTILE DYSFUNCTION 09/16/21   Michel Bickers, MD  BIKTARVY 50-200-25 MG TABS tablet TAKE 1 TABLET BY MOUTH DAILY 11/11/20   Michel Bickers, MD  diltiazem (CARDIZEM) 60 MG tablet Take 60 mg by mouth 2 (two) times daily.    11/17/11  [provider]  PREZCOBIX 800-150 MG tablet TAKE 1 TABLET BY MOUTH DAILY WITH BREAKFAST 11/11/20   Michel Bickers, MD      Allergies    Patient has no known allergies.    Review of Systems   Review of Systems  Gastrointestinal:  Positive for nausea and vomiting.  All other systems reviewed and are negative.   Physical Exam Updated Vital Signs BP (!) 132/99 (BP Location: Left Arm)   Pulse (!) 112   Temp 97.6 F (36.4 C) (Oral)   Resp 20   Ht 5\' 6"  (1.676 m)   Wt 68 kg   SpO2 100%   BMI 24.21 kg/m   Physical Exam Vitals and nursing note reviewed.  Constitutional:      Appearance: He is well-developed.  HENT:     Head: Normocephalic and atraumatic.  Eyes:     Conjunctiva/sclera: Conjunctivae normal.     Pupils: Pupils are equal, round, and reactive to light.  Cardiovascular:     Rate and Rhythm: Regular rhythm. Tachycardia present.     Heart sounds: Normal heart sounds.  Pulmonary:     Effort: Pulmonary effort is normal.  Breath sounds: Normal breath sounds.  Abdominal:     General: Bowel sounds are normal.     Palpations: Abdomen is soft.     Tenderness: There is no abdominal tenderness.     Comments: Grossly non-tender  Musculoskeletal:        General: Normal range of motion.     Cervical back: Normal range of motion.  Skin:    General: Skin is warm and dry.  Neurological:     Mental Status: He is alert and oriented to person, place, and time.     ED Results / Procedures / Treatments   Labs (all labs ordered are listed, but only abnormal results are displayed) Labs Reviewed  CBC WITH DIFFERENTIAL/PLATELET  COMPREHENSIVE METABOLIC PANEL   LIPASE, BLOOD  ETHANOL  PROTIME-INR  APTT  TYPE AND SCREEN    EKG None  Radiology No results found.  Procedures Procedures    Medications Ordered in ED Medications  sodium chloride 0.9 % bolus 1,000 mL (has no administration in time range)  pantoprazole (PROTONIX) 80 mg /NS 100 mL IVPB (has no administration in time range)  pantoprozole (PROTONIX) 80 mg /NS 100 mL infusion (has no administration in time range)  pantoprazole (PROTONIX) injection 40 mg (has no administration in time range)  ondansetron (ZOFRAN) injection 4 mg (has no administration in time range)    ED Course/ Medical Decision Making/ A&P                           Medical Decision Making Amount and/or Complexity of Data Reviewed Labs: ordered. ECG/medicine tests: ordered.  Risk Prescription drug management.   46 y.o. M here with hematemesis. Hx of alcohol abuse and has been drinking fireball today.  He is non-toxic in appearance but is tachycardic.  BP stable.  Abdomen without focal tenderness, reports hx of stomach ulcers.  Labs pending.   IVF, protonix, zofran ordered.  Will monitor.  3:02 AM Notified by RN that patient unhooked himself from the monitor and stated he is leaving.  He apparently refused IVF, medication, or further testing.  He has called his daughter to come get him and take him to the doctor in the morning.  I went to patient's room but he was already gone.  I was not able to have an AMA discussion with him prior to leaving the building, however he did sign AMA form with RN.  Final Clinical Impression(s) / ED Diagnoses Final diagnoses:  Hematemesis with nausea    Rx / DC Orders ED Discharge Orders     None         Garlon Hatchet, PA-C 07/01/22 0323    Tilden Fossa, MD 07/01/22 [phone]

## 2022-07-10 ENCOUNTER — Encounter (HOSPITAL_COMMUNITY): Payer: Self-pay | Admitting: Emergency Medicine

## 2022-07-10 ENCOUNTER — Other Ambulatory Visit: Payer: Self-pay

## 2022-07-10 ENCOUNTER — Emergency Department (HOSPITAL_COMMUNITY)
Admission: EM | Admit: 2022-07-10 | Discharge: 2022-07-10 | Disposition: A | Discharge status: Home / Self Care | Payer: Self-pay | Attending: Emergency Medicine | Admitting: Emergency Medicine

## 2022-07-10 DIAGNOSIS — R7401 Elevation of levels of liver transaminase levels: Secondary | ICD-10-CM | POA: Insufficient documentation

## 2022-07-10 DIAGNOSIS — I10 Essential (primary) hypertension: Secondary | ICD-10-CM | POA: Insufficient documentation

## 2022-07-10 DIAGNOSIS — Z79899 Other long term (current) drug therapy: Secondary | ICD-10-CM | POA: Insufficient documentation

## 2022-07-10 DIAGNOSIS — Y908 Blood alcohol level of 240 mg/100 ml or more: Secondary | ICD-10-CM | POA: Insufficient documentation

## 2022-07-10 DIAGNOSIS — F10930 Alcohol use, unspecified with withdrawal, uncomplicated: Secondary | ICD-10-CM

## 2022-07-10 DIAGNOSIS — Z21 Asymptomatic human immunodeficiency virus [HIV] infection status: Secondary | ICD-10-CM | POA: Insufficient documentation

## 2022-07-10 DIAGNOSIS — R000 Tachycardia, unspecified: Secondary | ICD-10-CM | POA: Insufficient documentation

## 2022-07-10 DIAGNOSIS — R5383 Other fatigue: Secondary | ICD-10-CM | POA: Insufficient documentation

## 2022-07-10 DIAGNOSIS — F10129 Alcohol abuse with intoxication, unspecified: Secondary | ICD-10-CM | POA: Insufficient documentation

## 2022-07-10 LAB — I-STAT VENOUS BLOOD GAS, ED
Acid-Base Excess: 6 mmol/L — ABNORMAL HIGH (ref 0.0–2.0)
Bicarbonate: 29.3 mmol/L — ABNORMAL HIGH (ref 20.0–28.0)
Calcium, Ion: 0.98 mmol/L — ABNORMAL LOW (ref 1.15–1.40)
HCT: 41 % (ref 39.0–52.0)
Hemoglobin: 13.9 g/dL (ref 13.0–17.0)
O2 Saturation: 100 %
Potassium: 4.1 mmol/L (ref 3.5–5.1)
Sodium: 135 mmol/L (ref 135–145)
TCO2: 30 mmol/L (ref 22–32)
pCO2, Ven: 37.5 mmHg — ABNORMAL LOW (ref 44–60)
pH, Ven: 7.501 — ABNORMAL HIGH (ref 7.25–7.43)
pO2, Ven: 181 mmHg — ABNORMAL HIGH (ref 32–45)

## 2022-07-10 LAB — MAGNESIUM: Magnesium: 1.8 mg/dL (ref 1.7–2.4)

## 2022-07-10 LAB — CBC WITH DIFFERENTIAL/PLATELET
Abs Immature Granulocytes: 0.01 10*3/uL (ref 0.00–0.07)
Basophils Absolute: 0.1 10*3/uL (ref 0.0–0.1)
Basophils Relative: 2 %
Eosinophils Absolute: 0 10*3/uL (ref 0.0–0.5)
Eosinophils Relative: 1 %
HCT: 38.3 % — ABNORMAL LOW (ref 39.0–52.0)
Hemoglobin: 12.9 g/dL — ABNORMAL LOW (ref 13.0–17.0)
Immature Granulocytes: 0 %
Lymphocytes Relative: 48 %
Lymphs Abs: 2.1 10*3/uL (ref 0.7–4.0)
MCH: 34 pg (ref 26.0–34.0)
MCHC: 33.7 g/dL (ref 30.0–36.0)
MCV: 101.1 fL — ABNORMAL HIGH (ref 80.0–100.0)
Monocytes Absolute: 0.5 10*3/uL (ref 0.1–1.0)
Monocytes Relative: 11 %
Neutro Abs: 1.6 10*3/uL — ABNORMAL LOW (ref 1.7–7.7)
Neutrophils Relative %: 38 %
Platelets: 182 10*3/uL (ref 150–400)
RBC: 3.79 MIL/uL — ABNORMAL LOW (ref 4.22–5.81)
RDW: 13.8 % (ref 11.5–15.5)
WBC: 4.4 10*3/uL (ref 4.0–10.5)
nRBC: 0 % (ref 0.0–0.2)

## 2022-07-10 LAB — COMPREHENSIVE METABOLIC PANEL
ALT: 34 U/L (ref 0–44)
AST: 108 U/L — ABNORMAL HIGH (ref 15–41)
Albumin: 4.1 g/dL (ref 3.5–5.0)
Alkaline Phosphatase: 45 U/L (ref 38–126)
Anion gap: 20 — ABNORMAL HIGH (ref 5–15)
BUN: 7 mg/dL (ref 6–20)
CO2: 22 mmol/L (ref 22–32)
Calcium: 9 mg/dL (ref 8.9–10.3)
Chloride: 94 mmol/L — ABNORMAL LOW (ref 98–111)
Creatinine, Ser: 0.72 mg/dL (ref 0.61–1.24)
GFR, Estimated: >60 mL/min (ref >60)
Glucose, Bld: 104 mg/dL — ABNORMAL HIGH (ref 70–99)
Potassium: 3.6 mmol/L (ref 3.5–5.1)
Sodium: 136 mmol/L (ref 135–145)
Total Bilirubin: 0.6 mg/dL (ref 0.3–1.2)
Total Protein: 7.9 g/dL (ref 6.5–8.1)

## 2022-07-10 LAB — RAPID URINE DRUG SCREEN, HOSP PERFORMED
Amphetamines: NOT DETECTED
Barbiturates: NOT DETECTED
Benzodiazepines: NOT DETECTED
Cocaine: NOT DETECTED
Opiates: NOT DETECTED
Tetrahydrocannabinol: NOT DETECTED

## 2022-07-10 LAB — URINALYSIS, ROUTINE W REFLEX MICROSCOPIC
Bacteria, UA: NONE SEEN
Glucose, UA: NEGATIVE mg/dL
Ketones, ur: NEGATIVE mg/dL
Leukocytes,Ua: NEGATIVE
Nitrite: NEGATIVE
Protein, ur: >=300 mg/dL — AB
Specific Gravity, Urine: 1.027 (ref 1.005–1.030)
pH: 5 (ref 5.0–8.0)

## 2022-07-10 LAB — ETHANOL: Alcohol, Ethyl (B): 259 mg/dL — ABNORMAL HIGH (ref <10)

## 2022-07-10 LAB — LIPASE, BLOOD: Lipase: 43 U/L (ref 11–51)

## 2022-07-10 MED ORDER — CHLORDIAZEPOXIDE HCL 25 MG PO CAPS: ORAL_CAPSULE | ORAL | 0 refills | Status: DC

## 2022-07-10 MED ORDER — LORAZEPAM 1 MG PO TABS: 0.0000 mg | ORAL_TABLET | Freq: Two times a day (BID) | ORAL | Status: DC

## 2022-07-10 MED ORDER — THIAMINE MONONITRATE 100 MG PO TABS: 100.0000 mg | ORAL_TABLET | Freq: Every day | ORAL | Status: DC

## 2022-07-10 MED ORDER — LORAZEPAM 2 MG/ML IJ SOLN
1.0000 mg | Freq: Once | INTRAMUSCULAR | Status: AC
  Administered 2022-07-10: 1 mg via INTRAVENOUS
  Filled 2022-07-10: qty 1

## 2022-07-10 MED ORDER — LACTATED RINGERS IV BOLUS
2000.0000 mL | Freq: Once | INTRAVENOUS | Status: AC
  Administered 2022-07-10: 2000 mL via INTRAVENOUS

## 2022-07-10 MED ORDER — ONDANSETRON HCL 4 MG/2ML IJ SOLN
4.0000 mg | Freq: Once | INTRAMUSCULAR | Status: AC
  Administered 2022-07-10: 4 mg via INTRAVENOUS
  Filled 2022-07-10: qty 2

## 2022-07-10 MED ORDER — LORAZEPAM 1 MG PO TABS
0.0000 mg | ORAL_TABLET | Freq: Four times a day (QID) | ORAL | Status: DC
  Administered 2022-07-10: 1 mg via ORAL
  Filled 2022-07-10: qty 1

## 2022-07-10 MED ORDER — CHLORDIAZEPOXIDE HCL 25 MG PO CAPS
50.0000 mg | ORAL_CAPSULE | Freq: Once | ORAL | Status: AC
  Administered 2022-07-10: 50 mg via ORAL
  Filled 2022-07-10: qty 2

## 2022-07-10 MED ORDER — THIAMINE HCL 100 MG/ML IJ SOLN
100.0000 mg | Freq: Once | INTRAMUSCULAR | Status: AC
  Administered 2022-07-10: 100 mg via INTRAVENOUS
  Filled 2022-07-10: qty 2

## 2022-07-10 NOTE — ED Triage Notes (Signed)
Patient reports elevated blood pressure this morning BP=180/57 and feels dehydrated with tremors this morning . ETOH last night .

## 2022-07-10 NOTE — ED Provider Notes (Signed)
[facility] EMERGENCY DEPARTMENT Provider Note   CSN: [id number] Arrival date & time: 07/10/22  4268     History  Chief Complaint  Patient presents with   Hypertensiion / Dehydrated     Darryl Berg is a 46 y.o. male.  HPI Patient presents for multiple complaints.  Medical history includes HIV, TB, anxiety, alcohol abuse, HTN, depression, gastritis.  His current alcohol consumption is approximately 1/2 gallon of liquor daily.  He states that there are times where he stops drinking but develops tremors, nausea, headaches.  He was seen in the ED 9 days ago for hematemesis.  He was ordered IV fluid, Protonix, and Zofran.  At that time, he refused any treatment or testing.  Today, he reports for nausea, vomiting, and fatigue.  He drank his normal amount of alcohol yesterday.  He developed severe tremors at approximately 3 AM.  He treated these at home with several shots of liquor.  He has not drank since then.  He has had 2 episodes of vomiting.  He continues to feel nauseous.  He also noticed that his blood pressure was high today.  He is prescribed medications for hypertension but states that he has run out of these and only takes them.  Patient states that he does wish to discontinue drinking alcohol altogether.    Home Medications Prior to Admission medications   Medication Sig Start Date End Date Taking? Authorizing Provider  chlordiazePOXIDE (LIBRIUM) 25 MG capsule 50mg  PO TID x 1D, then 25-50mg  PO BID X 1D, then 25-50mg  PO QD X 1D 07/10/22  Yes 13/11/23, MD  albuterol (VENTOLIN HFA) 108 (90 Base) MCG/ACT inhaler Inhale 2 puffs into the lungs every 2 (two) hours as needed for wheezing or shortness of breath (cough). 08/17/19   Muthersbaugh, 08/19/19, PA-C  bictegravir-emtricitabine-tenofovir AF (BIKTARVY) 50-200-25 MG TABS tablet Take 1 tablet by mouth daily. 09/16/21   09/18/21, MD  darunavir-cobicistat (PREZCOBIX) 800-150 MG tablet Take 1 tablet by mouth  daily with breakfast. 09/16/21   09/18/21, MD  hydrochlorothiazide (HYDRODIURIL) 50 MG tablet TAKE 1 TABLET BY MOUTH DAILY 09/17/21   09/19/21, MD  naltrexone (DEPADE) 50 MG tablet Take 1 tablet (50 mg total) by mouth daily. 09/24/21   09/26/21, MD  omeprazole (PRILOSEC) 20 MG capsule Take 1 capsule (20 mg total) by mouth daily. 09/26/16   09/28/16, MD  promethazine-dextromethorphan (PROMETHAZINE-DM) 6.25-15 MG/5ML syrup Take 5 mLs by mouth 4 (four) times daily as needed for cough. Patient not taking: Reported on 04/23/2021 08/17/19   Muthersbaugh, 08/19/19, PA-C  tadalafil (CIALIS) 10 MG tablet TAKE 1 TABLET BY MOUTH ONCE DAILY AS NEEDED FOR ERECTILE DYSFUNCTION 09/16/21   09/18/21, MD  BIKTARVY 50-200-25 MG TABS tablet TAKE 1 TABLET BY MOUTH DAILY 11/11/20   11/13/20, MD  diltiazem (CARDIZEM) 60 MG tablet Take 60 mg by mouth 2 (two) times daily.    11/17/11  [provider]  PREZCOBIX 800-150 MG tablet TAKE 1 TABLET BY MOUTH DAILY WITH BREAKFAST 11/11/20   11/13/20, MD      Allergies    Patient has no known allergies.    Review of Systems   Review of Systems  Constitutional:  Positive for fatigue.  Gastrointestinal:  Positive for nausea and vomiting.  Neurological:  Positive for headaches.  All other systems reviewed and are negative.   Physical Exam Updated Vital Signs BP (!) 158/108   Pulse (!) 101   Temp 97.8 F (  36.6 C) (Oral)   Resp 18   SpO2 98%  Physical Exam Vitals and nursing note reviewed.  Constitutional:      General: He is not in acute distress.    Appearance: Normal appearance. He is well-developed and normal weight. He is not ill-appearing, toxic-appearing or diaphoretic.  HENT:     Head: Normocephalic and atraumatic.     Right Ear: External ear normal.     Left Ear: External ear normal.     Nose: Nose normal.  Eyes:     General: No scleral icterus.    Extraocular Movements: Extraocular movements intact.      Conjunctiva/sclera: Conjunctivae normal.  Cardiovascular:     Rate and Rhythm: Regular rhythm. Tachycardia present.  Pulmonary:     Effort: Pulmonary effort is normal. No respiratory distress.  Abdominal:     General: There is no distension.     Palpations: Abdomen is soft.     Tenderness: There is no abdominal tenderness.  Musculoskeletal:        General: No swelling. Normal range of motion.     Cervical back: Normal range of motion and neck supple.     Right lower leg: No edema.     Left lower leg: No edema.  Skin:    General: Skin is warm and dry.     Coloration: Skin is not jaundiced or pale.  Neurological:     General: No focal deficit present.     Mental Status: He is alert and oriented to person, place, and time.     Cranial Nerves: No cranial nerve deficit.     Sensory: No sensory deficit.     Motor: No weakness.     Coordination: Coordination normal.  Psychiatric:        Mood and Affect: Mood normal.        Behavior: Behavior normal.        Thought Content: Thought content normal.        Judgment: Judgment normal.     ED Results / Procedures / Treatments   Labs (all labs ordered are listed, but only abnormal results are displayed) Labs Reviewed  CBC WITH DIFFERENTIAL/PLATELET - Abnormal; Notable for the following components:      Result Value   RBC 3.79 (*)    Hemoglobin 12.9 (*)    HCT 38.3 (*)    MCV 101.1 (*)    Neutro Abs 1.6 (*)    All other components within normal limits  ETHANOL - Abnormal; Notable for the following components:   Alcohol, Ethyl (B) 259 (*)    All other components within normal limits  COMPREHENSIVE METABOLIC PANEL - Abnormal; Notable for the following components:   Chloride 94 (*)    Glucose, Bld 104 (*)    AST 108 (*)    Anion gap 20 (*)    All other components within normal limits  URINALYSIS, ROUTINE W REFLEX MICROSCOPIC - Abnormal; Notable for the following components:   Color, Urine AMBER (*)    APPearance HAZY (*)    Hgb  urine dipstick SMALL (*)    Bilirubin Urine SMALL (*)    Protein, ur >=300 (*)    All other components within normal limits  I-STAT VENOUS BLOOD GAS, ED - Abnormal; Notable for the following components:   pH, Ven 7.501 (*)    pCO2, Ven 37.5 (*)    pO2, Ven 181 (*)    Bicarbonate 29.3 (*)    Acid-Base Excess 6.0 (*)  Calcium, Ion 0.98 (*)    All other components within normal limits  RAPID URINE DRUG SCREEN, HOSP PERFORMED  LIPASE, BLOOD  MAGNESIUM    EKG EKG Interpretation  Date/Time:  Saturday July 10 2022 12:11:24 EST Ventricular Rate:  94 PR Interval:  142 QRS Duration: 92 QT Interval:  392 QTC Calculation: 490 R Axis:   65 Text Interpretation: Normal sinus rhythm Confirmed by Gloris Manchester [phone]) on 07/10/2022 12:41:23 PM  Radiology No results found.  Procedures Procedures    Medications Ordered in ED Medications  lactated ringers bolus 2,000 mL (2,000 mLs Intravenous New Bag/Given 07/10/22 1200)  chlordiazePOXIDE (LIBRIUM) capsule 50 mg (50 mg Oral Given 07/10/22 1200)  LORazepam (ATIVAN) injection 1 mg (1 mg Intravenous Given 07/10/22 1157)  thiamine (VITAMIN B1) injection 100 mg (100 mg Intravenous Given 07/10/22 1158)  ondansetron (ZOFRAN) injection 4 mg (4 mg Intravenous Given 07/10/22 1157)    ED Course/ Medical Decision Making/ A&P                           Medical Decision Making Amount and/or Complexity of Data Reviewed Labs: ordered.  Risk Prescription drug management.   This patient presents to the ED for concern of multiple complaints, this involves an extensive number of treatment options, and is a complaint that carries with it a high risk of complications and morbidity.  The differential diagnosis includes alcohol withdrawal, dehydration, hepatitis, gastritis, metabolic derangements   Co morbidities that complicate the patient evaluation  HIV, TB, anxiety, alcohol abuse, HTN, depression, gastritis   Additional history  obtained:  Additional history obtained from N/A External records from outside source obtained and reviewed including EMR   Lab Tests:  I Ordered, and personally interpreted labs.  The pertinent results include: Normal kidney function, normal electrolytes, elevation in AST > ALT consistent with chronic alcohol abuse with otherwise normal hepatobiliary enzymes, macrocytosis consistent with chronic alcohol abuse, decrease in hemoglobin, no leukocytosis  Cardiac Monitoring: / EKG:  The patient was maintained on a cardiac monitor.  I personally viewed and interpreted the cardiac monitored which showed an underlying rhythm of: Sinus rhythm  Problem List / ED Course / Critical interventions / Medication management  Patient is a 46 year old male presenting with symptoms of alcohol withdrawal.  This includes nausea, vomiting, poor p.o. tolerance, headache, and fatigue.  He typically drinks about 1/2 gallon of liquor per day.  He drank up until 3 PM yesterday.  At 3 AM, he had several shots of liquor to treat severe tremors.  He arrives in the ED with complaints of continued nausea and fatigue.  He is concerned of dehydration.  He is also concerned of elevated blood pressure.  He is prescribed antihypertensive medications but has not taken these since running out of them.  On exam, patient is well-appearing.  He does not have any current evidence of tremors, diaphoresis, or hallucinations.  His vital signs are notable for moderate hypertension and tachycardia.  Will obtain lab work, hydrate with IV fluids, and treat for alcohol withdrawal with Librium and Ativan.  Laboratory work-up, obtained prior to being bedded in the ED, showed normal kidney function, normal electrolytes, elevation in AST > ALT consistent with chronic alcohol abuse with otherwise normal hepatobiliary enzymes, macrocytosis consistent with chronic alcohol abuse, decrease in hemoglobin, no leukocytosis.  On reassessment, patient endorses  resolution of nausea.  He was given food and drink while in the ED and was able to tolerate  this without difficulty.  Blood pressure remained moderately elevated.  I suspect this is, in part, to alcohol withdrawal.  Patient was advised to follow-up with his primary care doctor for reassessment of blood pressure when she is out of the withdrawal window.  He does state that he will be able to be seen by his PCP next week.  Patient continues to state that he wishes to stop drinking.  Frequent withdrawal symptoms are causing him difficulties at work.  He was prescribed a Librium taper.  He was advised to discontinue use of medication if he does resume drinking alcohol.  He was also encouraged to return to the ED if he does have worsening symptoms despite outpatient therapy.  Patient was discharged in good condition. I ordered medication including IV fluid for duration; thiamine for chronic alcoholism; Zofran for nausea; Librium and Ativan for alcohol withdrawal Reevaluation of the patient after these medicines showed that the patient resolved I have reviewed the patients home medicines and have made adjustments as needed   Social Determinants of Health:  Current alcohol abuse, has PCP         Final Clinical Impression(s) / ED Diagnoses Final diagnoses:  Alcohol withdrawal syndrome without complication (HCC)    Rx / DC Orders ED Discharge Orders          Ordered    chlordiazePOXIDE (LIBRIUM) 25 MG capsule        07/10/22 1348              Gloris Manchester, MD 07/10/22 1349

## 2022-07-10 NOTE — ED Notes (Signed)
Notified Dr Durwin Nora of pt's bp. Dr Durwin Nora stated he will order the pt's home bp meds.

## 2022-07-10 NOTE — ED Provider Triage Note (Signed)
Emergency Medicine Provider Triage Evaluation Note  Darryl Berg , a 46 y.o. male  was evaluated in triage.  Pt complains of tremors. Hx of heavy alcohol use; approximates drinking 1/2 gallon of liquor daily. Last drank yesterday and tremors began at 0300. Has some associated nausea. Denies chest pain, SOB, vomiting. Denies hx of seizures from alcohol withdrawal.  Review of Systems  Positive: As above Negative: As above  Physical Exam  BP (!) 153/104   Pulse (!) 113   Temp 99 F (37.2 C) (Oral)   Resp 16   SpO2 94%  Gen:   Awake, no distress   Resp:  Normal effort  MSK:   Moves extremities without difficulty  Other:  Tachycardia. No tremor visualized at beside.  Medical Decision Making  Medically screening exam initiated at 5:55 AM.  Appropriate orders placed.  Ayaansh Smail Cogdell was informed that the remainder of the evaluation will be completed by another provider, this initial triage assessment does not replace that evaluation, and the importance of remaining in the ED until their evaluation is complete.  ETOH abuse with concern for withdrawal. Will obtain labs and place on CIWA.   Antony Madura, PA-C 07/10/22 [phone]

## 2022-07-10 NOTE — Discharge Instructions (Addendum)
A medication was sent to your pharmacy called Librium.  This is a medication to treat and prevent alcohol withdrawal.  Take it in tapering dose, as instructed.  If you do resume drinking alcohol, please stop taking this medication.  If you have concerning symptoms despite this medication, please return to the emergency department.  Follow-up with your primary care doctor for reassessment of your blood pressure.

## 2022-07-10 NOTE — ED Notes (Signed)
Pt given a [place] bag as per Dr Durwin Nora verbal request

## 2022-07-15 ENCOUNTER — Ambulatory Visit: Payer: Self-pay | Admitting: Internal Medicine

## 2022-07-20 ENCOUNTER — Telehealth: Payer: Self-pay | Admitting: Internal Medicine

## 2022-07-20 NOTE — Telephone Encounter (Signed)
MRN: [id number]  I heard from him at end of day on Friday - he was in ER on the 11th - alcohol withdrawal again - he reports (as of Friday anyway) that he has quit drinking - on the 11th - he thought he was dying.    I looked at ER notes-  his level of drinking is mind blowing - he is not a large person....  I hope he gets it together - but he never wants to do formal "treatment".  (I regularly remind folks - if quitting was simply a matter of deciding that we didn't want to do something anymore - there would be no one abusing substances, no bad habits at all - no smokers - etc etc..Marland KitchenUnfortunately - it is not that simple.)  Time will tell I guess.   Darryl Berg [facility] [address], [address] [place], [place] [address]  Office [phone] Work cell [phone]

## 2022-07-26 ENCOUNTER — Other Ambulatory Visit: Payer: Self-pay

## 2022-07-26 DIAGNOSIS — B20 Human immunodeficiency virus [HIV] disease: Secondary | ICD-10-CM

## 2022-07-26 MED ORDER — PREZCOBIX 800-150 MG PO TABS: 1.0000 | ORAL_TABLET | Freq: Every day | ORAL | 1 refills | Status: DC

## 2022-07-27 ENCOUNTER — Other Ambulatory Visit: Payer: Self-pay

## 2022-07-27 ENCOUNTER — Encounter: Payer: Self-pay | Admitting: Internal Medicine

## 2022-07-27 ENCOUNTER — Ambulatory Visit (INDEPENDENT_AMBULATORY_CARE_PROVIDER_SITE_OTHER): Payer: Self-pay | Admitting: Internal Medicine

## 2022-07-27 VITALS — BP 133/80 | HR 83 | Resp 16 | Ht 66.0 in | Wt 150.0 lb

## 2022-07-27 DIAGNOSIS — F1010 Alcohol abuse, uncomplicated: Secondary | ICD-10-CM

## 2022-07-27 DIAGNOSIS — Z23 Encounter for immunization: Secondary | ICD-10-CM

## 2022-07-27 DIAGNOSIS — F331 Major depressive disorder, recurrent, moderate: Secondary | ICD-10-CM

## 2022-07-27 DIAGNOSIS — I10 Essential (primary) hypertension: Secondary | ICD-10-CM

## 2022-07-27 DIAGNOSIS — B20 Human immunodeficiency virus [HIV] disease: Secondary | ICD-10-CM

## 2022-07-27 MED ORDER — PREZCOBIX 800-150 MG PO TABS: 1.0000 | ORAL_TABLET | Freq: Every day | ORAL | 11 refills | Status: DC

## 2022-07-27 MED ORDER — HYDROCHLOROTHIAZIDE 50 MG PO TABS: 50.0000 mg | ORAL_TABLET | Freq: Every day | ORAL | 2 refills | Status: DC

## 2022-07-27 MED ORDER — BIKTARVY 50-200-25 MG PO TABS: 1.0000 | ORAL_TABLET | Freq: Every day | ORAL | 11 refills | Status: DC

## 2022-07-27 NOTE — Assessment & Plan Note (Signed)
I was very direct with him about his severe, chronic all addiction.  He is in denial and has been unwilling to accept help with counseling and drug treatment.  I challenged him to think about why I am he has been unwilling to accept this.  Life has been spiraling out of control over the past year.  He needs intensive, long-term counseling and treatment.  I encouraged him to talk about this with our bridge counselor.

## 2022-07-27 NOTE — Assessment & Plan Note (Signed)
His infection has not been under good control because of continued poor adherence with his antiviral regimen.  He will get updated blood work today and follow-up in 3 months.  He received his influenza vaccine here today.

## 2022-07-27 NOTE — Assessment & Plan Note (Signed)
He admits that he uses alcohol to self medicate.  He says that when he first drinks he feels happier but then feels terrible the next day.

## 2022-07-27 NOTE — Progress Notes (Signed)
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   CHLORDIAZEPOXIDE (LIBRIUM) 25 MG CAPSULE    50mg  PO TID x 1D, then 25-50mg  PO BID X 1D, then 25-50mg  PO QD X 1D   NALTREXONE (DEPADE) 50 MG TABLET    Take 1 tablet (50 mg total) by mouth daily.   OMEPRAZOLE (PRILOSEC) 20 MG CAPSULE    Take 1 capsule (20 mg total) by mouth daily.   PROMETHAZINE-DEXTROMETHORPHAN (PROMETHAZINE-DM) 6.25-15 MG/5ML SYRUP    Take 5 mLs by mouth 4 (four) times daily as needed for cough.   TADALAFIL (CIALIS) 10 MG TABLET    TAKE 1 TABLET BY MOUTH ONCE DAILY AS NEEDED FOR ERECTILE DYSFUNCTION  Modified Medications   Modified Medication Previous Medication   BICTEGRAVIR-EMTRICITABINE-TENOFOVIR AF (BIKTARVY) 50-200-25 MG TABS TABLET bictegravir-emtricitabine-tenofovir AF (BIKTARVY) 50-200-25 MG TABS tablet      Take 1 tablet by mouth daily.    Take 1 tablet by mouth daily.   DARUNAVIR-COBICISTAT (PREZCOBIX) 800-150 MG TABLET darunavir-cobicistat (PREZCOBIX) 800-150 MG tablet      Take 1 tablet by mouth daily with breakfast.    Take 1 tablet by mouth daily with breakfast.   HYDROCHLOROTHIAZIDE (HYDRODIURIL) 50 MG  TABLET hydrochlorothiazide (HYDRODIURIL) 50 MG tablet      Take 1 tablet (50 mg total) by mouth daily.    TAKE 1 TABLET BY MOUTH DAILY  Discontinued Medications   No medications on file    Subjective: JP is in for his routine HIV follow-up visit.  He has been missing doses of his Biktarvy, Prezcobix and blood pressure medication recently.  He has had 2 visits to the emergency department in the past month for alcohol intoxication.  3 weeks ago his alcohol level was 379 mg/dL.  When he returned 2 weeks ago it was 259 mg/dL.  He was alert and able to talk during both visits.  He is currently working a job with a 11-01-[employer] but has had a great deal of difficulty keeping jobs over the last year.  He cannot drive because of past DWIs and currently has lost his driver's license and his green card.  He does not have health insurance.  He does not have a PCP.  Review of Systems: Review of Systems  Constitutional:  Negative for fever and weight loss.  Respiratory:  Negative for cough.   Cardiovascular:  Negative for chest pain.  Gastrointestinal:  Negative for abdominal pain, diarrhea, nausea and vomiting.  Psychiatric/Behavioral:  Positive for depression. The patient is not nervous/anxious.     Past Medical History:  Diagnosis Date   Depression    HIV disease (HCC)  Substance abuse (HCC)     Social History   Tobacco Use   Smoking status: Never   Smokeless tobacco: Never  Vaping Use   Vaping Use: Never used  Substance Use Topics   Alcohol use: Not Currently    Alcohol/week: 12.0 standard drinks of alcohol    Types: 12 Standard drinks or equivalent per week    Comment: weekends   Drug use: No    Family History  Problem Relation Age of Onset   Hypertension Father     No Known Allergies  Health Maintenance  Topic Date Due   COVID-19 Vaccine (1) Never done   COLONOSCOPY (Pts 45-51yrs Insurance coverage will need to be confirmed)  Never done   INFLUENZA VACCINE  Completed    Hepatitis C Screening  Completed   HIV Screening  Completed   HPV VACCINES  Aged Out    Objective:  Vitals:   07/27/22 1613  BP: 133/80  Pulse: 83  Resp: 16  SpO2: 99%  Weight: 150 lb (68 kg)  Height: 5\' 6"  (1.676 m)   Body mass index is 24.21 kg/m.  Physical Exam Constitutional:      Comments: He is calm and pleasant.  I had to continually redirect him during our conversation.  Cardiovascular:     Rate and Rhythm: Normal rate and regular rhythm.     Heart sounds: No murmur heard. Pulmonary:     Effort: Pulmonary effort is normal.     Breath sounds: Normal breath sounds.  Neurological:     General: No focal deficit present.  Psychiatric:        Mood and Affect: Mood normal.     Lab Results Lab Results  Component Value Date   WBC 4.4 07/10/2022   HGB 13.9 07/10/2022   HCT 41.0 07/10/2022   MCV 101.1 (H) 07/10/2022   PLT 182 07/10/2022    Lab Results  Component Value Date   CREATININE 0.72 07/10/2022   BUN 7 07/10/2022   NA 135 07/10/2022   K 4.1 07/10/2022   CL 94 (L) 07/10/2022   CO2 22 07/10/2022    Lab Results  Component Value Date   ALT 34 07/10/2022   AST 108 (H) 07/10/2022   ALKPHOS 45 07/10/2022   BILITOT 0.6 07/10/2022    Lab Results  Component Value Date   CHOL 255 (H) 09/16/2021   HDL 111 09/16/2021   LDLCALC 108 (H) 09/16/2021   LDLCALC CANCELED 09/16/2021   TRIG 239 (H) 09/16/2021   CHOLHDL 2.3 09/16/2021   Lab Results  Component Value Date   LABRPR NON-REACTIVE 09/16/2021   LABRPR CANCELED 09/16/2021   HIV 1 RNA Quant  Date Value  03/17/2022 709 Copies/mL (H)  09/16/2021 5,140 Copies/mL (H)  09/16/2021 CANCELED   CD4 T Cell Abs (/uL)  Date Value  03/17/2022 389 (L)  09/16/2021 349 (L)  04/23/2021 412     Problem List Items Addressed This Visit       High   Human immunodeficiency virus (HIV) disease (HCC) - Primary    His infection has not been under good control because of continued poor adherence with his  antiviral regimen.  He will get updated blood work today and follow-up in 3 months.  He received his influenza vaccine here today.      Relevant Medications   bictegravir-emtricitabine-tenofovir AF (BIKTARVY) 50-200-25 MG TABS tablet   darunavir-cobicistat (PREZCOBIX) 800-150 MG tablet   Other Relevant Orders   T-helper cells (CD4) count (not at  [facility])   HIV-1 RNA quant-no reflex-bld   CBC   Comprehensive metabolic panel   RPR     Unprioritized   ABUSE, ALCOHOL, CONTINUOUS    I was very direct with him about his severe, chronic all addiction.  He is in denial and has been unwilling to accept help with counseling and drug treatment.  I challenged him to think about why I am he has been unwilling to accept this.  Life has been spiraling out of control over the past year.  He needs intensive, long-term counseling and treatment.  I encouraged him to talk about this with our bridge counselor.      Depression    He admits that he uses alcohol to self medicate.  He says that when he first drinks he feels happier but then feels terrible the next day.      Other Visit Diagnoses     Benign hypertension       Relevant Medications   hydrochlorothiazide (HYDRODIURIL) 50 MG tablet   Need for immunization against influenza       Relevant Orders   Flu Vaccine QUAD 88mo+IM (Fluarix, Fluzone & Alfiuria Quad PF) (Completed)         Cliffton Asters, MD [facility] [phone] pager   [phone] cell 07/27/2022, 5:00 PM

## 2022-07-28 LAB — T-HELPER CELLS (CD4) COUNT (NOT AT ARMC)
CD4 % Helper T Cell: 24 % — ABNORMAL LOW (ref 33–65)
CD4 T Cell Abs: 390 /uL — ABNORMAL LOW (ref 400–1790)

## 2022-07-29 LAB — CBC
HCT: 35.1 % — ABNORMAL LOW (ref 38.5–50.0)
Hemoglobin: 12 g/dL — ABNORMAL LOW (ref 13.2–17.1)
MCH: 34.9 pg — ABNORMAL HIGH (ref 27.0–33.0)
MCHC: 34.2 g/dL (ref 32.0–36.0)
MCV: 102 fL — ABNORMAL HIGH (ref 80.0–100.0)
MPV: 9.9 fL (ref 7.5–12.5)
Platelets: 309 10*3/uL (ref 140–400)
RBC: 3.44 10*6/uL — ABNORMAL LOW (ref 4.20–5.80)
RDW: 12.5 % (ref 11.0–15.0)
WBC: 3.5 10*3/uL — ABNORMAL LOW (ref 3.8–10.8)

## 2022-07-29 LAB — COMPREHENSIVE METABOLIC PANEL
AG Ratio: 1.4 (calc) (ref 1.0–2.5)
ALT: 21 U/L (ref 9–46)
AST: 26 U/L (ref 10–40)
Albumin: 4.2 g/dL (ref 3.6–5.1)
Alkaline phosphatase (APISO): 41 U/L (ref 36–130)
BUN/Creatinine Ratio: 4 (calc) — ABNORMAL LOW (ref 6–22)
BUN: 4 mg/dL — ABNORMAL LOW (ref 7–25)
CO2: 26 mmol/L (ref 20–32)
Calcium: 9.2 mg/dL (ref 8.6–10.3)
Chloride: 109 mmol/L (ref 98–110)
Creat: 1.01 mg/dL (ref 0.60–1.29)
Globulin: 3.1 g/dL (calc) (ref 1.9–3.7)
Glucose, Bld: 108 mg/dL — ABNORMAL HIGH (ref 65–99)
Potassium: 4 mmol/L (ref 3.5–5.3)
Sodium: 142 mmol/L (ref 135–146)
Total Bilirubin: 0.3 mg/dL (ref 0.2–1.2)
Total Protein: 7.3 g/dL (ref 6.1–8.1)

## 2022-07-29 LAB — HIV-1 RNA QUANT-NO REFLEX-BLD
HIV 1 RNA Quant: <20 Copies/mL — ABNORMAL HIGH
HIV-1 RNA Quant, Log: <1.3 Log cps/mL — ABNORMAL HIGH

## 2022-07-29 LAB — RPR: RPR Ser Ql: NONREACTIVE

## 2022-08-16 ENCOUNTER — Telehealth: Payer: Self-pay

## 2022-08-16 NOTE — Telephone Encounter (Signed)
Patient called, states he has been experiencing nerve pain in his left leg at night when he goes to bed for the last few months. He is requesting a prescription for gabapentin.   Sandie Ano, RN

## 2022-08-17 NOTE — Telephone Encounter (Signed)
Called patient to relay provider's message, no answer. Left HIPAA compliant voicemail requesting callback.   Marq Rebello D Kemp Gomes, RN  

## 2022-08-18 ENCOUNTER — Ambulatory Visit: Payer: Self-pay

## 2022-08-18 NOTE — Telephone Encounter (Signed)
Patient returned call, relayed that Dr. Orvan Falconer would like him to get a PCP to manage this. Provided him with phone number to [facility].   Sandie Ano, RN

## 2022-08-18 NOTE — Telephone Encounter (Signed)
     Pt. Requesting PCP phone numbers. Given [facility] phone numbers. Answer Assessment - Initial Assessment Questions 1. ONSET: "When did the pain start?"      2 months  2. LOCATION: "Where is the pain located?"      Both legs at night 3. PAIN: "How bad is the pain?"    (Scale 1-10; or mild, moderate, severe)   -  MILD (1-3): doesn't interfere with normal activities    -  MODERATE (4-7): interferes with normal activities (e.g., work or school) or awakens from sleep, limping    -  SEVERE (8-10): excruciating pain, unable to do any normal activities, unable to walk     Moderate 4. WORK OR EXERCISE: "Has there been any recent work or exercise that involved this part of the body?"      No 5. CAUSE: "What do you think is causing the leg pain?"     Unsure 6. OTHER SYMPTOMS: "Do you have any other symptoms?" (e.g., chest pain, back pain, breathing difficulty, swelling, rash, fever, numbness, weakness)     Pain 7. PREGNANCY: "Is there any chance you are pregnant?" "When was your last menstrual period?"     N/A  Protocols used: Leg Pain-A-AH

## 2022-08-18 NOTE — Telephone Encounter (Signed)
Patient called, left VM to return the call to the office to discuss symptoms with a nurse.   Summary: left leg pain   Patient called in says left leg pains him at night when he lays down.

## 2022-08-18 NOTE — Telephone Encounter (Signed)
Second attempt to call patient- advised call nurse line to discuss symptoms

## 2022-09-03 ENCOUNTER — Other Ambulatory Visit: Payer: Self-pay | Admitting: Internal Medicine

## 2022-09-03 DIAGNOSIS — N529 Male erectile dysfunction, unspecified: Secondary | ICD-10-CM

## 2022-09-06 ENCOUNTER — Ambulatory Visit (INDEPENDENT_AMBULATORY_CARE_PROVIDER_SITE_OTHER): Payer: Self-pay | Admitting: Student

## 2022-09-06 ENCOUNTER — Other Ambulatory Visit (HOSPITAL_COMMUNITY): Payer: Self-pay

## 2022-09-06 VITALS — BP 153/91 | HR 71 | Ht 66.0 in | Wt 154.2 lb

## 2022-09-06 DIAGNOSIS — D539 Nutritional anemia, unspecified: Secondary | ICD-10-CM

## 2022-09-06 DIAGNOSIS — B20 Human immunodeficiency virus [HIV] disease: Secondary | ICD-10-CM

## 2022-09-06 DIAGNOSIS — R202 Paresthesia of skin: Secondary | ICD-10-CM

## 2022-09-06 DIAGNOSIS — Z0000 Encounter for general adult medical examination without abnormal findings: Secondary | ICD-10-CM

## 2022-09-06 DIAGNOSIS — M79605 Pain in left leg: Secondary | ICD-10-CM

## 2022-09-06 DIAGNOSIS — F1010 Alcohol abuse, uncomplicated: Secondary | ICD-10-CM

## 2022-09-06 DIAGNOSIS — I10 Essential (primary) hypertension: Secondary | ICD-10-CM

## 2022-09-06 DIAGNOSIS — R52 Pain, unspecified: Secondary | ICD-10-CM

## 2022-09-06 MED ORDER — VITAMIN B-12 100 MCG PO TABS: 100.0000 ug | ORAL_TABLET | Freq: Every day | ORAL | Status: AC

## 2022-09-06 MED ORDER — AMLODIPINE BESYLATE 5 MG PO TABS
5.0000 mg | ORAL_TABLET | Freq: Every day | ORAL | 11 refills | Status: DC
  Filled 2022-09-06: qty 30, 30d supply, fill #0

## 2022-09-06 MED ORDER — VITAMIN B-1 50 MG PO TABS: 50.0000 mg | ORAL_TABLET | Freq: Every day | ORAL | Status: DC

## 2022-09-06 MED ORDER — HYDROCHLOROTHIAZIDE 25 MG PO TABS
25.0000 mg | ORAL_TABLET | Freq: Every day | ORAL | 11 refills | Status: DC
  Filled 2022-09-06: qty 30, 30d supply, fill #0

## 2022-09-06 MED ORDER — GABAPENTIN 300 MG PO CAPS
300.0000 mg | ORAL_CAPSULE | Freq: Every day | ORAL | 3 refills | Status: DC
  Filled 2022-09-06: qty 30, 30d supply, fill #0

## 2022-09-06 MED ORDER — FOLIC ACID 1 MG PO TABS: 1.0000 mg | ORAL_TABLET | Freq: Every day | ORAL | Status: DC

## 2022-09-06 NOTE — Patient Instructions (Addendum)
For your wart, please keep using salicylic acid until we see you in two weeks.    For your blood pressure, please take amlodipine 5mg  and HCTZ 25mg  daily.   Please pick up your medicines at:  [facility]  [place]

## 2022-09-06 NOTE — Telephone Encounter (Signed)
Please advise on refill.

## 2022-09-07 ENCOUNTER — Other Ambulatory Visit (HOSPITAL_COMMUNITY): Payer: Self-pay

## 2022-09-07 ENCOUNTER — Encounter: Payer: Self-pay | Admitting: Student

## 2022-09-07 DIAGNOSIS — Z0000 Encounter for general adult medical examination without abnormal findings: Secondary | ICD-10-CM | POA: Insufficient documentation

## 2022-09-07 DIAGNOSIS — M79605 Pain in left leg: Secondary | ICD-10-CM | POA: Insufficient documentation

## 2022-09-07 DIAGNOSIS — D539 Nutritional anemia, unspecified: Secondary | ICD-10-CM | POA: Insufficient documentation

## 2022-09-07 HISTORY — DX: Pain in left leg: M79.605

## 2022-09-07 HISTORY — DX: Nutritional anemia, unspecified: D53.9

## 2022-09-07 MED ORDER — IBUPROFEN 600 MG PO TABS
600.0000 mg | ORAL_TABLET | Freq: Four times a day (QID) | ORAL | 0 refills | Status: DC
  Filled 2022-09-07: qty 28, 7d supply, fill #0

## 2022-09-07 NOTE — Progress Notes (Signed)
Patient presented to clinic due to pain and blistering of his R fourth digit where he recently received cryotherapy for a wart in this area. Discussed with patient that blistering and some discomfort is appropriate and that the blister should unroof in the next several days. Gave him instructions on not to disrupt the blister, to let it spontaneously drain and to cover the wound while at work but keep it to air when at home. He is in agreement with this plan. He was also prescribed ibuprofen for his pain.

## 2022-09-07 NOTE — Assessment & Plan Note (Signed)
BP is not controlled. He notes he is intermittently adherent with his medications. Will reduce HCTZ to 25mg  daily and add amlodipine. He will follow up in 2 weeks for his blood pressure.

## 2022-09-07 NOTE — Assessment & Plan Note (Signed)
Likely related to his alcohol use and concomitant vit deficiencies. Unable to obtain labs given uninsured status. If able to get labs with [facility] would recommend vitb1, b12, folate.  -Empirically treated patient for deficiencies in these

## 2022-09-07 NOTE — Assessment & Plan Note (Addendum)
Unclear exact etiology of patient's L leg pain. It is possible that it is neuropathic pain as he has many risk factors for this including HIV, alcohol use, and likely vitamin deficiencies. He had a negative straight leg raise test and his description of his pain does not sound radicular in nature. He had no TTP of the L hip, spinous processes of L or T spine, or of iliac crest. Making bony injury to these structures less likely.  Will treat neuropathic pain, with gabapentin and treat likely vitamin deficiencies.  Will follow up in 2 weeks regarding his LL pain

## 2022-09-07 NOTE — Assessment & Plan Note (Signed)
He will fill out information for orange card so he can get his colonoscopy.

## 2022-09-07 NOTE — Progress Notes (Signed)
   CC: Establish care, wart   HPI:  [name] is a 47 y.o. M with PMH per below who presents to establish care and for the following acute problems.   Patient states for the last 2 months he has been having L lower extremity pain. He states he has a sharp pain that is most appreciable with sitting down and at night time. He does not experience the pain with walking or other movements. He has no associated back pain. He has tried APAP and NSAIDs to minimal relief.   He also notes a wart on the palmar aspect of his 4th digit. He has tried salicylic OTC for this but it has not completely resolved his wart.   Regarding his blood pressure. He states that he has not taken his medication this AM and intermittently takes his HCTZ 50mg , usually only on the days he works.    Past Medical History:  Diagnosis Date   Depression    HIV disease ([place])    Substance abuse ([place])    Review of Systems:  Negative except per above   Physical Exam:  Vitals:   09/06/22 1531 09/06/22 1533  BP: (!) 162/103 (!) 153/91  Pulse: 78 71  SpO2: 100%   Weight: 154 lb 3.2 oz (69.9 kg)   Height: 5\' 6"  (1.676 m)    Constitutional: Well-developed, well-nourished, and in no distress.  Cardiovascular: Normal rate, regular rhythm, intact distal pulses. No gallop and no friction rub.  No murmur heard. No lower extremity edema  Pulmonary: Non labored breathing on room air, no wheezing or rales  Abdominal: Soft. Normal bowel sounds. Non distended and non tender Musculoskeletal: Normal range of motion.        General: No tenderness or edema.  Neurological: Alert and oriented to person, place, and time. Non focal. 5/5 BLE strength, neg straight leg raise test  Skin: Skin is warm and dry. Palmar aspect of R 4th digit with 3cm verrucous lesion     Media Information    Assessment & Plan:   See Encounters Tab for problem based charting.  Patient discussed with Dr. Daryll Drown

## 2022-09-07 NOTE — Assessment & Plan Note (Signed)
Patient continues on naltrexone. He states he has drastically cutback on his alcohol use. He drinks about 1 glass of wine weekly.

## 2022-09-07 NOTE — Assessment & Plan Note (Signed)
He follows with [facility]. He has not been undetectable in the past due to non adherence. Currently on biktarvy and prezcobix. Has follow up scheduled with [facility]. Patient will need lipid profile and A1c at that visit given he is uninsured.

## 2022-09-08 ENCOUNTER — Other Ambulatory Visit (HOSPITAL_COMMUNITY): Payer: Self-pay

## 2022-09-08 ENCOUNTER — Other Ambulatory Visit: Payer: Self-pay | Admitting: Internal Medicine

## 2022-09-08 DIAGNOSIS — N529 Male erectile dysfunction, unspecified: Secondary | ICD-10-CM

## 2022-09-09 ENCOUNTER — Encounter (HOSPITAL_COMMUNITY): Payer: Self-pay | Admitting: Emergency Medicine

## 2022-09-09 ENCOUNTER — Ambulatory Visit (HOSPITAL_COMMUNITY)
Admission: EM | Admit: 2022-09-09 | Discharge: 2022-09-09 | Disposition: A | Discharge status: Home / Self Care | Payer: Self-pay | Attending: Urgent Care | Admitting: Urgent Care

## 2022-09-09 ENCOUNTER — Other Ambulatory Visit (HOSPITAL_COMMUNITY): Payer: Self-pay

## 2022-09-09 DIAGNOSIS — T23622A Corrosion of second degree of single left finger (nail) except thumb, initial encounter: Secondary | ICD-10-CM

## 2022-09-09 DIAGNOSIS — R238 Other skin changes: Secondary | ICD-10-CM

## 2022-09-09 DIAGNOSIS — T6591XA Toxic effect of unspecified substance, accidental (unintentional), initial encounter: Secondary | ICD-10-CM

## 2022-09-09 MED ORDER — MUPIROCIN 2 % EX OINT: 1.0000 | TOPICAL_OINTMENT | Freq: Two times a day (BID) | CUTANEOUS | 0 refills | Status: AC

## 2022-09-09 NOTE — ED Triage Notes (Signed)
Pt had wart on 3rd finger on left hand frozen at doctor office. Since then has swelling to finger with pain. Denies any drainage.

## 2022-09-09 NOTE — ED Provider Notes (Signed)
[place]    CSN: [id number] Arrival date & time: 09/09/22  1134      History   Chief Complaint Chief Complaint  Patient presents with   Hand Pain    HPI Darryl Berg is a 47 y.o. male.   47 year old male presents today due to concerns of hand pain.  He states on Monday he was at the doctor's getting a wart frozen off.  It was on his proximal phalanx of his middle finger, volar aspect.  He states they did numerous freezing attempts, which caused him much discomfort during the procedure.  He went home and states 1 hour afterwards, started developing a severe large painful blister to his entire phalanx, extending into his MCP joint.  He reports pain due to the swelling, but denies any warmth or erythema to the skin.  He reports decreased range of motion due to the swelling.  He has not attempted any treatments.  He states that the wart is still there.   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 Authorizing Provider  mupirocin ointment (BACTROBAN) 2 % Apply 1 Application topically 2 (two) times daily. 09/09/22  Yes Caly Pellum L, PA  albuterol (VENTOLIN HFA) 108 (90 Base) MCG/ACT inhaler Inhale 2 puffs into the lungs every 2 (two) hours as needed for wheezing or shortness of breath (cough). 08/17/19   Muthersbaugh, Jarrett Soho, PA-C  amLODipine (NORVASC) 5 MG tablet Take 1 tablet (5 mg total) by mouth daily. 09/06/22 09/06/23  Rick Duff, MD  bictegravir-emtricitabine-tenofovir AF (BIKTARVY) 50-200-25 MG TABS tablet Take 1 tablet by mouth daily. 07/27/22   Michel Bickers, MD  chlordiazePOXIDE (LIBRIUM) 25 MG capsule 50mg  PO TID x 1D, then 25-50mg  PO BID X 1D, then 25-50mg  PO QD X 1D 07/10/22   Godfrey Pick, MD  darunavir-cobicistat (PREZCOBIX) 800-150 MG tablet Take 1 tablet by mouth daily with breakfast. 07/27/22   Michel Bickers, MD  folic acid (FOLVITE) 1 MG tablet Take 1 tablet (1 mg total) by mouth daily. 09/06/22   Rick Duff, MD  gabapentin (NEURONTIN) 300 MG capsule Take 1 capsule (300 mg total) by mouth at bedtime. 09/06/22 09/06/23  Rick Duff, MD  hydrochlorothiazide (HYDRODIURIL) 25 MG tablet Take 1 tablet (25 mg total) by mouth daily. 09/06/22   Rick Duff, MD  ibuprofen (ADVIL) 600 MG tablet Take 1 tablet (600 mg total) by mouth every 6 (six) hours for 7 days. For  the first two days take 600mg  every 6 hours scheduled, then for the next 5 days take 600mg  every 6 hours as needed. 09/07/22 09/14/22  Rick Duff, MD  naltrexone (DEPADE) 50 MG tablet Take 1 tablet (50 mg total) by mouth daily. 09/24/21   Michel Bickers, MD  omeprazole (PRILOSEC) 20 MG capsule Take 1 capsule (20 mg total) by mouth daily. 09/26/16   Asencion Partridge, MD  tadalafil (CIALIS) 10 MG tablet TAKE 1 TABLET BY MOUTH ONCE DAILY AS NEEDED FOR ERECTILE DYSFUNCTION 09/16/21   Michel Bickers, MD  thiamine (VITAMIN B-1) 50 MG tablet Take 1 tablet (50 mg total) by  mouth daily. 09/06/22   Rick Duff, MD  vitamin B-12 (CYANOCOBALAMIN) 100 MCG tablet Take 1 tablet (100 mcg total) by mouth daily. 09/06/22   Rick Duff, MD  BIKTARVY 50-200-25 MG TABS tablet TAKE 1 TABLET BY MOUTH DAILY 11/11/20   Michel Bickers, MD  diltiazem (CARDIZEM) 60 MG tablet Take 60 mg by mouth 2 (two) times daily.    11/17/11  [provider]  PREZCOBIX 800-150 MG tablet TAKE 1 TABLET BY MOUTH DAILY WITH BREAKFAST 11/11/20   Michel Bickers, MD    Family History Family History  Problem Relation Age of Onset   Hypertension Father     Social History Social History   Tobacco Use   Smoking status: Never   Smokeless tobacco: Never  Vaping Use   Vaping Use: Never used  Substance Use Topics   Alcohol use: Not Currently    Alcohol/week: 12.0 standard drinks of alcohol    Types: 12 Standard drinks or equivalent per week    Comment: weekends   Drug use: No     Allergies   Patient has no known allergies.   Review of Systems Review of Systems As per HPI  Physical Exam Triage Vital Signs ED Triage Vitals  Enc Vitals Group     BP 09/09/22 1226 (!) 134/91     Pulse Rate 09/09/22 1226 100     Resp 09/09/22 1226 16     Temp 09/09/22 1226 98.4 F (36.9 C)     Temp Source 09/09/22 1226 Oral     SpO2 09/09/22 1226 98 %     Weight --      Height --      Head Circumference --      Peak Flow --      Pain Score 09/09/22 1225 10     Pain Loc --      Pain Edu? --      Excl. in [place]? --    No data found.  Updated Vital Signs BP (!) 134/91 (BP Location: Right Arm)   Pulse 100   Temp 98.4 F (36.9 C) (Oral)   Resp 16   SpO2 98%   Visual Acuity Right Eye Distance:   Left Eye Distance:   Bilateral Distance:    Right Eye Near:   Left Eye Near:    Bilateral Near:     Physical Exam Vitals and nursing note reviewed.  Constitutional:      General: He is not in acute distress.    Appearance: Normal appearance. He is normal weight. He is not  ill-appearing, toxic-appearing or diaphoretic.  Cardiovascular:     Rate and Rhythm: Normal rate.  Pulmonary:     Effort: Pulmonary effort is normal. No respiratory distress.  Musculoskeletal:        General: Swelling and tenderness present.     Cervical back: Normal  range of motion and neck supple. No rigidity or tenderness.     Comments: Large fluid-filled bullae to his L middle finger, proximal phalanx. Transilluminates. No warmth or erythema to skin.  Drained in office with clear fluid, no purulence or blood noted.  Lymphadenopathy:     Cervical: No cervical adenopathy.  Skin:    General: Skin is warm and dry.     Capillary Refill: Capillary refill takes less than 2 seconds.     Findings: Lesion (large wart; see picture) present. No erythema or rash.  Neurological:     General: No focal deficit present.     Mental Status: He is alert and oriented to person, place, and time.             UC Treatments / Results  Labs (all labs ordered are listed, but only abnormal results are displayed) Labs Reviewed - No data to display  EKG   Radiology No results found.  Procedures Incision and Drainage  Date/Time: 09/09/2022 2:35 PM  Performed by: Maretta Bees, PA Authorized by: Maretta Bees, PA   Consent:    Consent obtained:  Verbal   Consent given by:  Patient   Alternatives discussed:  No treatment and observation Universal protocol:    Procedure explained and questions answered to patient or proxy's satisfaction: yes     Patient identity confirmed:  Verbally with patient Location:    Type:  Bulla   Location:  Upper extremity   Upper extremity location:  Finger   Finger location:  L long finger Pre-procedure details:    Skin preparation:  Antiseptic wash and chlorhexidine with alcohol Sedation:    Sedation type:  None Anesthesia:    Anesthesia method:  None Procedure details:    Needle aspiration: yes     Needle size:  22 G   Incision types:  Stab  incision   Incision depth:  Dermal   Drainage:  Serous   Drainage amount:  Moderate   Wound treatment:  Wound left open   Packing materials:  None Post-procedure details:    Procedure completion:  Tolerated  (including critical care time)  Medications Ordered in UC Medications - No data to display  Initial Impression / Assessment and Plan / UC Course  I have reviewed the triage vital signs and the nursing notes.  Pertinent labs & imaging results that were available during my care of the patient were reviewed by me and considered in my medical decision making (see chart for details).     Bullae -large bullae noted to middle finger, likely an allergic chemical reaction versus burn to the cryotherapy.  We did use needle aspiration to the fluid today, patient left with nearly all the fluid resolved.  6 cc was removed from his finger, with normalization to range of motion. Pt instructed to soak in Epsom salts, apply topical antibiotic ointment to the area to prevent infection.  May use p.o. Benadryl to help with swelling  Final Clinical Impressions(s) / UC Diagnoses   Final diagnoses:  Bullae  Allergic reaction to chemical substance, accidental or unintentional, initial encounter     Discharge Instructions      You had a second-degree burn secondary to the wart removing agent. There is no active infection.  We removed the fluid in office.  Please go home and immediately soak the area in warm Epsom salt water. Topically apply mupirocin ointment to the area in which we aspirated the fluid.  For the remainder of  your wart, consider follow-up with dermatology as they may need to shave off the top portion.  You can use over-the-counter salicylic acid/ wart remover pads. Apply this to the remaining wart.     ED Prescriptions     Medication Sig Dispense Auth. Provider   mupirocin ointment (BACTROBAN) 2 % Apply 1 Application topically 2 (two) times daily. 22 g Leola Fiore L, [place]       PDMP not reviewed this encounter.   Maretta Bees, [place] 09/09/22 1436

## 2022-09-09 NOTE — Discharge Instructions (Signed)
You had a second-degree burn secondary to the wart removing agent. There is no active infection.  We removed the fluid in office.  Please go home and immediately soak the area in warm Epsom salt water. Topically apply mupirocin ointment to the area in which we aspirated the fluid.  For the remainder of your wart, consider follow-up with dermatology as they may need to shave off the top portion.  You can use over-the-counter salicylic acid/ wart remover pads. Apply this to the remaining wart.

## 2022-09-12 NOTE — Progress Notes (Signed)
Internal Medicine Clinic Attending  Case discussed with Dr. Carter  at the time of the visit.  We reviewed the resident's history and exam and pertinent patient test results.  I agree with the assessment, diagnosis, and plan of care documented in the resident's note.  

## 2022-09-14 ENCOUNTER — Telehealth: Payer: Self-pay | Admitting: *Deleted

## 2022-09-14 ENCOUNTER — Encounter: Payer: Self-pay | Admitting: Student

## 2022-09-14 ENCOUNTER — Ambulatory Visit (INDEPENDENT_AMBULATORY_CARE_PROVIDER_SITE_OTHER): Payer: Self-pay | Admitting: Student

## 2022-09-14 VITALS — BP 156/85 | HR 129 | Temp 98.0°F | Ht 66.0 in | Wt 152.2 lb

## 2022-09-14 DIAGNOSIS — M79642 Pain in left hand: Secondary | ICD-10-CM

## 2022-09-14 DIAGNOSIS — Z9889 Other specified postprocedural states: Secondary | ICD-10-CM

## 2022-09-14 MED ORDER — HYDROCODONE-ACETAMINOPHEN 5-325MG PREPACK (~~LOC~~: 6.0000 | ORAL_TABLET | Freq: Two times a day (BID) | ORAL | 0 refills | Status: DC | PRN

## 2022-09-14 NOTE — Telephone Encounter (Signed)
Patient called in stating he went to UC on 1/11 for blister on right finger. States they drained it but fluid has returned. States his job is threatening to fire him as he is unable to work. Discussed with PCP and patient given appt for this afternoon at 2:45.

## 2022-09-14 NOTE — Patient Instructions (Addendum)
Please protect your wound with non stick gauze and other gauze for padding while you are working.    Skin care Do not pop your blister. This can cause infection. Keep your blister clean and dry. This helps to prevent infection. Before you swim or use a hot tub, cover your blister with a waterproof dressing. Protect the area where your blister has formed as told by your health care provider. Follow instructions from your health care provider about how to take care of your blister. Make sure you: Wash your hands with soap and water for at least 20 seconds before and after you change your dressing. If soap and water are not available, use hand sanitizer. Change your dressing as told by your health care provider. Infection signs Check your blister every day for signs of infection. Check for: More redness, swelling, or pain. More fluid or blood. Warmth. Pus or a bad smell. General instructions If you have a blister on a foot or toe, wear different shoes until your blister heals. Avoid the activity that caused the blister until your blister heals. Keep all follow-up visits as told by your health care provider. This is important.

## 2022-09-14 NOTE — Progress Notes (Signed)
Procedure- Cryotherapy   After discussion of the risks, benefits, and alternative therapies available, the patient elected to proceed.   Diagnosis: Wart on plantar surface of R 4th digit      The lesion was treated using liquid nitrogen spray gun for 6 second per cycle,?1 cycles total. The patient had some stinging pain, after the procedure from the liquid nitrogen but there were no immediate complications.

## 2022-09-14 NOTE — Progress Notes (Signed)
Procedure- Cryotherapy   After discussion of the risks, benefits, and alternative therapies available, the patient elected to proceed.   Diagnosis: Wart on plantar surface of R 4th digit      The lesion was treated using liquid nitrogen spray gun for 6 second per cycle,?1 cycles total. The patient had some stinging pain, after the procedure from the liquid nitrogen but there were no immediate complications.   

## 2022-09-15 ENCOUNTER — Other Ambulatory Visit: Payer: Self-pay | Admitting: *Deleted

## 2022-09-15 DIAGNOSIS — M79642 Pain in left hand: Secondary | ICD-10-CM

## 2022-09-15 DIAGNOSIS — Z9889 Other specified postprocedural states: Secondary | ICD-10-CM | POA: Insufficient documentation

## 2022-09-15 HISTORY — DX: Other specified postprocedural states: Z98.890

## 2022-09-15 NOTE — Progress Notes (Signed)
   CC: F/u complication of cryotherapy for wart removal   HPI:  [name] is a 47 y.o. M with PMH per below who presents for follow up of complication from cryotherapy for wart removal. Patient was seen in our office 1/8 for cryotherapy. 1/9 he returned to clinic with a significant bullae in this area. He notes that he was having difficulty doing his job. Our team discussed with patient that blistering of the surrounding skin can occur with cryotherapy. Reassured him that with time this would unroof by itself and resolve. Instructed patient to return to clinic for drainage of the bullae if he develops worsening pain. He presented to urgent care for pain in this area 1/11. They drained the fluid collection. He states that about 2d prior to today's clinic visit he developed re accumulation of the fluid and he drained this himself. He notes the pain improved significantly but he continues to have discomfort when he has to place his work gloves on his hand. He has been using APAP and ibuprofen for his pain.   Past Medical History:  Diagnosis Date   Depression    HIV disease ([place])    Substance abuse ([place])    Review of Systems:  Negative except per above   Physical Exam:  Vitals:   09/14/22 1505  BP: (!) 156/85  Pulse: (!) 129  Temp: 98 F (36.7 C)  TempSrc: Oral  SpO2: 100%  Weight: 152 lb 3.2 oz (69 kg)  Height: 5\' 6"  (1.676 m)   Constitutional: Well-developed, well-nourished, and in no distress.  Cardiovascular: Normal rate, regular rhythm, intact distal pulses. No gallop and no friction rub.  No murmur heard. No lower extremity edema  Pulmonary: Non labored breathing on room air, no wheezing or rales  Abdominal: Soft. Normal bowel sounds. Non distended and non tender Musculoskeletal: Normal range of motion.         Neurological: Alert and oriented to person, place, and time. Non focal  Skin: Skin is warm and dry.    Assessment & Plan:   See Encounters Tab for problem  based charting.  Patient discussed with Dr.  Cain Sieve

## 2022-09-15 NOTE — Telephone Encounter (Signed)
Call from [facility] to clarify directions on Hydrocodone:   "Take 6 tablets by mouth every 12 (twelve) hours as needed. " Re-send new rx if appropriate. Thanks

## 2022-09-15 NOTE — Assessment & Plan Note (Addendum)
Patient has had a bullae after cryotherapy of a wart on his fourth digit of the R hand. He continues to have significant discomfort for this.   Drained this with scalpel for patient. Instructed him to use non stick gauze and 4x4 to pad the area when he goes to work to prevent rubbing of the area. Discussed with patient that he should continue a 7d course of ibuprofen 600mg  QID and APAP 1000mg  TID. He was also given a short course of hydrocodone.  He was given instructions of when to return to clinic.

## 2022-09-16 ENCOUNTER — Other Ambulatory Visit: Payer: Self-pay | Admitting: Student

## 2022-09-16 DIAGNOSIS — M79642 Pain in left hand: Secondary | ICD-10-CM

## 2022-09-16 MED ORDER — HYDROCODONE-ACETAMINOPHEN 5-325MG PREPACK (~~LOC~~: ORAL_TABLET | ORAL | 0 refills | Status: DC

## 2022-09-16 MED ORDER — HYDROCODONE-ACETAMINOPHEN 5-325 MG PO TABS: 1.0000 | ORAL_TABLET | Freq: Two times a day (BID) | ORAL | 0 refills | Status: DC | PRN

## 2022-09-16 NOTE — Progress Notes (Signed)
Internal Medicine Clinic Attending  Case discussed with Dr. Carter  At the time of the visit.  We reviewed the resident's history and exam and pertinent patient test results.  I agree with the assessment, diagnosis, and plan of care documented in the resident's note.  

## 2022-09-16 NOTE — Addendum Note (Signed)
Addended by: Lalla Brothers T on: 09/16/2022 01:27 PM   Modules accepted: Orders

## 2022-09-16 NOTE — Addendum Note (Signed)
Addended by: Velora Heckler on: 09/16/2022 01:14 PM   Modules accepted: Orders

## 2022-09-16 NOTE — Addendum Note (Signed)
Addended by: Velora Heckler on: 09/16/2022 01:20 PM   Modules accepted: Orders

## 2022-09-16 NOTE — Telephone Encounter (Signed)
Hydrocodone rx written as "Print"; re-send as normal/electronically. Thanks

## 2022-10-06 ENCOUNTER — Telehealth: Payer: Self-pay

## 2022-10-06 ENCOUNTER — Other Ambulatory Visit: Payer: Self-pay

## 2022-10-06 DIAGNOSIS — N529 Male erectile dysfunction, unspecified: Secondary | ICD-10-CM

## 2022-10-06 MED ORDER — TADALAFIL 10 MG PO TABS: 10.0000 mg | ORAL_TABLET | Freq: Every day | ORAL | 3 refills | Status: DC | PRN

## 2022-10-06 NOTE — Telephone Encounter (Signed)
Patient aware of refill sent to pharmacy and for PCP to manage next refills.

## 2022-10-06 NOTE — Telephone Encounter (Signed)
Received a call from patient asking for refill for Cialis. Patient next appointment is on 2/27. Ok to refill?

## 2022-10-07 ENCOUNTER — Telehealth: Payer: Self-pay | Admitting: Student

## 2022-10-07 ENCOUNTER — Other Ambulatory Visit: Payer: Self-pay | Admitting: Student

## 2022-10-07 DIAGNOSIS — B079 Viral wart, unspecified: Secondary | ICD-10-CM

## 2022-10-07 NOTE — Telephone Encounter (Signed)
Pt is requesting a referral to a dermatologist.  Pt states his wart on his finger is coming back and he wants to see a specialist.  Can Referral be placed or does he need another appointment.

## 2022-10-08 ENCOUNTER — Other Ambulatory Visit: Payer: Self-pay | Admitting: Student

## 2022-10-08 DIAGNOSIS — B079 Viral wart, unspecified: Secondary | ICD-10-CM

## 2022-10-13 ENCOUNTER — Encounter: Payer: Self-pay | Admitting: Internal Medicine

## 2022-10-13 ENCOUNTER — Ambulatory Visit (INDEPENDENT_AMBULATORY_CARE_PROVIDER_SITE_OTHER): Payer: Self-pay | Admitting: Internal Medicine

## 2022-10-13 ENCOUNTER — Other Ambulatory Visit: Payer: Self-pay

## 2022-10-13 VITALS — BP 148/98 | HR 81 | Temp 98.2°F | Ht 66.0 in | Wt 146.5 lb

## 2022-10-13 DIAGNOSIS — I10 Essential (primary) hypertension: Secondary | ICD-10-CM

## 2022-10-13 DIAGNOSIS — Z9889 Other specified postprocedural states: Secondary | ICD-10-CM

## 2022-10-13 NOTE — Progress Notes (Unsigned)
CC: follow up for wart  HPI:  Darryl Berg is a 47 y.o. with medical history of HIV, HTN, ED presenting to [facility] for follow up on wart.   Please see problem-based list for further details, assessments, and plans.  Past Medical History:  Diagnosis Date   Depression    HIV disease ([place])    Substance abuse ([place])      Current Outpatient Medications (Cardiovascular):    amLODipine (NORVASC) 5 MG tablet, Take 1 tablet (5 mg total) by mouth daily.   hydrochlorothiazide (HYDRODIURIL) 25 MG tablet, Take 1 tablet (25 mg total) by mouth daily.   tadalafil (CIALIS) 10 MG tablet, Take 1 tablet (10 mg total) by mouth daily as needed for erectile dysfunction.  Current Outpatient Medications (Respiratory):    albuterol (VENTOLIN HFA) 108 (90 Base) MCG/ACT inhaler, Inhale 2 puffs into the lungs every 2 (two) hours as needed for wheezing or shortness of breath (cough).  Current Outpatient Medications (Analgesics):    ibuprofen (ADVIL) 600 MG tablet, Take 1 tablet (600 mg total) by mouth every 6 (six) hours for 7 days. For the first two days take 641m every 6 hours scheduled, then for the next 5 days take 6029mevery 6 hours as needed.  Current Outpatient Medications (Hematological):    folic acid (FOLVITE) 1 MG tablet, Take 1 tablet (1 mg total) by mouth daily.   vitamin B-12 (CYANOCOBALAMIN) 100 MCG tablet, Take 1 tablet (100 mcg total) by mouth daily.  Current Outpatient Medications (Other):    bictegravir-emtricitabine-tenofovir AF (BIKTARVY) 50-200-25 MG TABS tablet, Take 1 tablet by mouth daily.   chlordiazePOXIDE (LIBRIUM) 25 MG capsule, 501mO TID x 1D, then 25-40m28m BID X 1D, then 25-40mg77mQD X 1D   darunavir-cobicistat (PREZCOBIX) 800-150 MG tablet, Take 1 tablet by mouth daily with breakfast.   gabapentin (NEURONTIN) 300 MG capsule, Take 1 capsule (300 mg total) by mouth at bedtime.   mupirocin ointment (BACTROBAN) 2 %, Apply 1 Application topically 2 (two) times daily.    naltrexone (DEPADE) 50 MG tablet, Take 1 tablet (50 mg total) by mouth daily.   omeprazole (PRILOSEC) 20 MG capsule, Take 1 capsule (20 mg total) by mouth daily.   thiamine (VITAMIN B-1) 50 MG tablet, Take 1 tablet (50 mg total) by mouth daily.  Review of Systems:  Review of system negative unless stated in the problem list or HPI.    Physical Exam:  Vitals:   10/13/22 1557  BP: (!) 148/98  Pulse: 81  Temp: 98.2 F (36.8 C)  TempSrc: Oral  SpO2: 99%  Weight: 146 lb 8 oz (66.5 kg)  Height: 5' 6"$  (1.676 m)    Physical Exam General: NAD HENT: NCAT Lungs: CTAB, no wheeze, rhonchi or rales.  Cardiovascular: Normal heart sounds, no r/m/g, 2+ pulses in all extremities. No LE edema Abdomen: No TTP, normal bowel sounds MSK: No asymmetry or muscle atrophy.  Skin: wart present anterior surface of 3rd digit. Neuro: Alert and oriented x4. CN grossly intact Psych: Normal mood and normal affect   Assessment & Plan:   S/P cryotherapy of skin lesion Patient's wart still present after cryotherapy. Plan was to perform repeat cryotherapy this visit but patient states he wants to postpone this until an appointment on Friday so he can use the weekend to recover. Offered a work note but pt states he is not able to miss any days at work so will get an appointment on a Friday to get this done.  Essential hypertension Pt has HTN and BP is uncontrolled today. He is reporting discomfort due to the wart which could be contributory but appears he is nonadherent to his regimen based on pt's refill history. Both amlodipine and HCTZ due for refill but have not been picked up. Will follow up on barriers to adherence at next [place] visit.    See Encounters Tab for problem based charting.  Patient discussed with Dr. Denzil Magnuson, MD Darryl Berg. [facility] Internal Medicine Residency, PGY-2

## 2022-10-13 NOTE — Patient Instructions (Signed)
Darryl Berg, it was a pleasure seeing you today! You endorsed feeling well today. Below are some of the things we talked about this visit. We look forward to seeing you in the follow up appointment!  Today we discussed: We need to remove the wart. We can do this at the next appointment as you don't want to remove it today.   I have ordered the following labs today:  Lab Orders  No laboratory test(s) ordered today      Referrals ordered today:   Referral Orders  No referral(s) requested today     I have ordered the following medication/changed the following medications:   Stop the following medications: There are no discontinued medications.   Start the following medications: No orders of the defined types were placed in this encounter.    Follow-up: Next appointment on Friday.   Please make sure to arrive 15 minutes prior to your next appointment. If you arrive late, you may be asked to reschedule.   We look forward to seeing you next time. Please call our clinic at [phone] if you have any questions or concerns. The best time to call is Monday-Friday from 9am-4pm, but there is someone available 24/7. If after hours or the weekend, call the main hospital number and ask for the Internal Medicine Resident On-Call. If you need medication refills, please notify your pharmacy one week in advance and they will send [place] a request.  Thank you for letting us take part in your care. Wishing you the best!  Thank you, Idamae Schuller, MD

## 2022-10-15 NOTE — Assessment & Plan Note (Signed)
Pt has HTN and BP is uncontrolled today. He is reporting discomfort due to the wart which could be contributory but appears he is nonadherent to his regimen based on pt's refill history. Both amlodipine and HCTZ due for refill but have not been picked up. Will follow up on barriers to adherence at next [place] visit.

## 2022-10-15 NOTE — Assessment & Plan Note (Signed)
Patient's wart still present after cryotherapy. Plan was to perform repeat cryotherapy this visit but patient states he wants to postpone this until an appointment on Friday so he can use the weekend to recover. Offered a work note but pt states he is not able to miss any days at work so will get an appointment on a Friday to get this done.

## 2022-10-19 NOTE — Progress Notes (Signed)
Internal Medicine Clinic Attending  Case discussed with Dr. Khan  At the time of the visit.  We reviewed the resident's history and exam and pertinent patient test results.  I agree with the assessment, diagnosis, and plan of care documented in the resident's note.  

## 2022-10-22 ENCOUNTER — Other Ambulatory Visit: Payer: Self-pay

## 2022-10-26 ENCOUNTER — Ambulatory Visit: Payer: Self-pay | Admitting: Internal Medicine

## 2022-11-02 ENCOUNTER — Ambulatory Visit (INDEPENDENT_AMBULATORY_CARE_PROVIDER_SITE_OTHER): Payer: Self-pay | Admitting: Internal Medicine

## 2022-11-02 ENCOUNTER — Encounter: Payer: Self-pay | Admitting: Internal Medicine

## 2022-11-02 ENCOUNTER — Other Ambulatory Visit: Payer: Self-pay

## 2022-11-02 VITALS — BP 147/100 | HR 74 | Temp 98.0°F | Ht 66.0 in | Wt 146.0 lb

## 2022-11-02 DIAGNOSIS — F1010 Alcohol abuse, uncomplicated: Secondary | ICD-10-CM

## 2022-11-02 DIAGNOSIS — B20 Human immunodeficiency virus [HIV] disease: Secondary | ICD-10-CM

## 2022-11-02 DIAGNOSIS — I10 Essential (primary) hypertension: Secondary | ICD-10-CM

## 2022-11-02 NOTE — Assessment & Plan Note (Signed)
His blood pressure is well above goal and he has been inconsistent in taking his antihypertensive medication.  I encouraged him to use his pill canister for his blood pressure medication in addition to his HIV medications.

## 2022-11-02 NOTE — Assessment & Plan Note (Signed)
I showed him how his HIV viral load consistently suppresses when he takes his medication.  He will continue his current regimen and follow-up after lab work in 6 months.

## 2022-11-02 NOTE — Progress Notes (Signed)
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   AMLODIPINE (NORVASC) 5 MG TABLET    Take 1 tablet (5 mg total) by mouth daily.   BICTEGRAVIR-EMTRICITABINE-TENOFOVIR AF (BIKTARVY) 50-200-25 MG TABS TABLET    Take 1 tablet by mouth daily.   CHLORDIAZEPOXIDE (LIBRIUM) 25 MG CAPSULE    '50mg'$  PO TID x 1D, then 25-'50mg'$  PO BID X 1D, then 25-'50mg'$  PO QD X 1D   DARUNAVIR-COBICISTAT (PREZCOBIX) 800-150 MG TABLET    Take 1 tablet by mouth daily with breakfast.   FOLIC ACID (FOLVITE) 1 MG TABLET    Take 1 tablet (1 mg total) by mouth daily.   GABAPENTIN (NEURONTIN) 300 MG CAPSULE    Take 1 capsule (300 mg total) by mouth at bedtime.   HYDROCHLOROTHIAZIDE (HYDRODIURIL) 25 MG TABLET    Take 1 tablet (25 mg total) by mouth daily.   MUPIROCIN OINTMENT (BACTROBAN) 2 %    Apply 1 Application topically 2 (two) times daily.   NALTREXONE (DEPADE) 50 MG TABLET    Take 1 tablet (50 mg total) by mouth daily.   OMEPRAZOLE (PRILOSEC) 20 MG CAPSULE    Take 1 capsule (20 mg  total) by mouth daily.   TADALAFIL (CIALIS) 10 MG TABLET    Take 1 tablet (10 mg total) by mouth daily as needed for erectile dysfunction.   THIAMINE (VITAMIN B-1) 50 MG TABLET    Take 1 tablet (50 mg total) by mouth daily.   VITAMIN B-12 (CYANOCOBALAMIN) 100 MCG TABLET    Take 1 tablet (100 mcg total) by mouth daily.  Modified Medications   No medications on file  Discontinued Medications   IBUPROFEN (ADVIL) 600 MG TABLET    Take 1 tablet (600 mg total) by mouth every 6 (six) hours for 7 days. For the first two days take '600mg'$  every 6 hours scheduled, then for the next 5 days take '600mg'$  every 6 hours as needed.    Subjective: Darryl Berg is in for his routine HIV follow-up visit.  He denies problems obtaining, taking or tolerating his Biktarvy or Prezcobix.  He usually takes it in the morning but also takes a small pill canister to work in case he forgets to take it before leaving home.  He does not recall missing doses.  He does not use the pill canister for his blood pressure medication and did not take it this morning.  He says that his mood has been good and he has not been feeling depressed.  When I  asked him about alcohol intake he says, waving his hand "so-so".  Review of Systems: Review of Systems  Constitutional:  Negative for fever, malaise/fatigue and weight loss.  Respiratory:  Negative for cough.   Cardiovascular:  Negative for chest pain.  Psychiatric/Behavioral:  Negative for depression. The patient is not nervous/anxious.     Past Medical History:  Diagnosis Date   Depression    HIV disease ([place])    Substance abuse ([place])     Social History   Tobacco Use   Smoking status: Never   Smokeless tobacco: Never  Vaping Use   Vaping Use: Never used  Substance Use Topics   Alcohol use: Not Currently    Alcohol/week: 12.0 standard drinks of alcohol    Types: 12 Standard drinks or equivalent per week    Comment: weekends   Drug use: No    Family History  Problem Relation Age  of Onset   Hypertension Father     No Known Allergies  Health Maintenance  Topic Date Due   COVID-19 Vaccine (1) Never done   COLONOSCOPY (Pts 45-39yr Insurance coverage will need to be confirmed)  Never done   DTaP/Tdap/Td (2 - Td or Tdap) 07/18/2021   INFLUENZA VACCINE  Completed   Hepatitis C Screening  Completed   HIV Screening  Completed   HPV VACCINES  Aged Out    Objective:  Vitals:   11/02/22 1554 11/02/22 1559 11/02/22 1636  BP: (!) 145/90 (!) 141/101 (!) 147/100  Pulse: 74    Temp: 98 F (36.7 C)    TempSrc: Temporal    Weight: 146 lb (66.2 kg)    Height: '5\' 6"'$  (1.676 m)     Body mass index is 23.57 kg/m.  Physical Exam Constitutional:      Comments: He is calm and pleasant today.  Cardiovascular:     Rate and Rhythm: Normal rate and regular rhythm.     Heart sounds: No murmur heard. Pulmonary:     Effort: Pulmonary effort is normal.     Breath sounds: Normal breath sounds.  Psychiatric:        Mood and Affect: Mood normal.     Lab Results Lab Results  Component Value Date   WBC 3.5 (L) 07/27/2022   HGB 12.0 (L) 07/27/2022   HCT 35.1 (L) 07/27/2022   MCV 102.0 (H) 07/27/2022   PLT 309 07/27/2022    Lab Results  Component Value Date   CREATININE 1.01 07/27/2022   BUN 4 (L) 07/27/2022   NA 142 07/27/2022   K 4.0 07/27/2022   CL 109 07/27/2022   CO2 26 07/27/2022    Lab Results  Component Value Date   ALT 21 07/27/2022   AST 26 07/27/2022   ALKPHOS 45 07/10/2022   BILITOT 0.3 07/27/2022    Lab Results  Component Value Date   CHOL 255 (H) 09/16/2021   HDL 111 09/16/2021   LDLCALC 108 (H) 09/16/2021   LDLCALC CANCELED 09/16/2021   TRIG 239 (H) 09/16/2021   CHOLHDL 2.3 09/16/2021   Lab Results  Component Value Date   LABRPR NON-REACTIVE 07/27/2022   HIV 1 RNA Quant  Date Value  07/27/2022 <20 Copies/mL (H)  03/17/2022 709 Copies/mL (H)  09/16/2021 5,140 Copies/mL (H)  09/16/2021 CANCELED   CD4 T Cell Abs (/uL)  Date  Value  07/27/2022 390 (L)  03/17/2022 389 (L)  09/16/2021 349 (L)     Problem List Items Addressed This Visit  High   Essential hypertension    His blood pressure is well above goal and he has been inconsistent in taking his antihypertensive medication.  I encouraged him to use his pill canister for his blood pressure medication in addition to his HIV medications.      Human immunodeficiency virus (HIV) disease ([place]) - Primary    I showed him how his HIV viral load consistently suppresses when he takes his medication.  He will continue his current regimen and follow-up after lab work in 6 months.      Relevant Orders   T-helper cells (CD4) count (not at [facility])   HIV-1 RNA quant-no reflex-bld   CBC   Comprehensive metabolic panel   RPR   Lipid panel     Unprioritized   ABUSE, ALCOHOL, CONTINUOUS    When I asked him what meds he was taking he did not list naltrexone but later told me that although he was taking it.  I am not sure what to believe but I know that he still has a problem with alcohol consumption.         Michel Bickers, MD [facility] for [facility] [phone] pager   [phone] cell 11/02/2022, 4:51 PM

## 2022-11-02 NOTE — Assessment & Plan Note (Signed)
When I asked him what meds he was taking he did not list naltrexone but later told me that although he was taking it.  I am not sure what to believe but I know that he still has a problem with alcohol consumption.

## 2022-11-03 ENCOUNTER — Telehealth: Payer: Self-pay | Admitting: Internal Medicine

## 2022-11-03 LAB — T-HELPER CELLS (CD4) COUNT (NOT AT ARMC)
CD4 % Helper T Cell: 18 % — ABNORMAL LOW (ref 33–65)
CD4 T Cell Abs: 269 /uL — ABNORMAL LOW (ref 400–1790)

## 2022-11-03 NOTE — Telephone Encounter (Signed)
I am happy to see that he kept the appointment without me driving him (I was in [place] with another patient)  He is NOT taking the Naltrexone consistently - because he has not had me buy it for him (not covered - and he doesn't have the money-  and fortunately I have a "slush fund" I can use for that)  I will follow up with him on that - (and remind him about the high blood pressure meds.)  I wanted him to be "in charge" of his recovery-  and call me for refills.  He always says he is sober when I talk to him - so glad he was more honest with you (but not 100% I see)  Lots of drama with his in-laws of late - which probably is adding to his problems too.     [facility] [address], [place],  [address]  Office [phone] Work cell [phone]

## 2022-11-05 LAB — LIPID PANEL
Cholesterol: 220 mg/dL — ABNORMAL HIGH (ref <200)
HDL: 95 mg/dL (ref >=40)
LDL Cholesterol (Calc): 106 mg/dL (calc) — ABNORMAL HIGH
Non-HDL Cholesterol (Calc): 125 mg/dL (calc) (ref <130)
Total CHOL/HDL Ratio: 2.3 (calc) (ref <5.0)
Triglycerides: 97 mg/dL (ref <150)

## 2022-11-05 LAB — HIV-1 RNA QUANT-NO REFLEX-BLD
HIV 1 RNA Quant: 3050 Copies/mL — ABNORMAL HIGH
HIV-1 RNA Quant, Log: 3.48 Log cps/mL — ABNORMAL HIGH

## 2022-11-05 LAB — CBC
HCT: 38.7 % (ref 38.5–50.0)
Hemoglobin: 13.7 g/dL (ref 13.2–17.1)
MCH: 34.6 pg — ABNORMAL HIGH (ref 27.0–33.0)
MCHC: 35.4 g/dL (ref 32.0–36.0)
MCV: 97.7 fL (ref 80.0–100.0)
MPV: 10 fL (ref 7.5–12.5)
Platelets: 169 10*3/uL (ref 140–400)
RBC: 3.96 10*6/uL — ABNORMAL LOW (ref 4.20–5.80)
RDW: 17.2 % — ABNORMAL HIGH (ref 11.0–15.0)
WBC: 3.5 10*3/uL — ABNORMAL LOW (ref 3.8–10.8)

## 2022-11-05 LAB — COMPREHENSIVE METABOLIC PANEL
AG Ratio: 1.5 (calc) (ref 1.0–2.5)
ALT: 36 U/L (ref 9–46)
AST: 78 U/L — ABNORMAL HIGH (ref 10–40)
Albumin: 4.6 g/dL (ref 3.6–5.1)
Alkaline phosphatase (APISO): 46 U/L (ref 36–130)
BUN: 11 mg/dL (ref 7–25)
CO2: 29 mmol/L (ref 20–32)
Calcium: 9.7 mg/dL (ref 8.6–10.3)
Chloride: 94 mmol/L — ABNORMAL LOW (ref 98–110)
Creat: 0.83 mg/dL (ref 0.60–1.29)
Globulin: 3.1 g/dL (calc) (ref 1.9–3.7)
Glucose, Bld: 95 mg/dL (ref 65–99)
Potassium: 3.5 mmol/L (ref 3.5–5.3)
Sodium: 137 mmol/L (ref 135–146)
Total Bilirubin: 0.8 mg/dL (ref 0.2–1.2)
Total Protein: 7.7 g/dL (ref 6.1–8.1)

## 2022-11-05 LAB — RPR: RPR Ser Ql: NONREACTIVE

## 2022-11-16 ENCOUNTER — Encounter: Payer: Self-pay | Admitting: Student

## 2022-11-25 ENCOUNTER — Other Ambulatory Visit: Payer: Self-pay

## 2022-11-25 ENCOUNTER — Ambulatory Visit (INDEPENDENT_AMBULATORY_CARE_PROVIDER_SITE_OTHER): Payer: Self-pay | Admitting: Internal Medicine

## 2022-11-25 ENCOUNTER — Encounter: Payer: Self-pay | Admitting: Internal Medicine

## 2022-11-25 VITALS — BP 153/100 | HR 102 | Temp 97.9°F | Ht 66.0 in | Wt 137.4 lb

## 2022-11-25 DIAGNOSIS — I10 Essential (primary) hypertension: Secondary | ICD-10-CM

## 2022-11-25 DIAGNOSIS — F1010 Alcohol abuse, uncomplicated: Secondary | ICD-10-CM

## 2022-11-25 MED ORDER — CHLORDIAZEPOXIDE HCL 25 MG PO CAPS: ORAL_CAPSULE | ORAL | 0 refills | Status: AC

## 2022-11-25 NOTE — Assessment & Plan Note (Addendum)
Patient states that he has still been drinking quite regularly.  He does not explicitly state how much he drinks, but he notes that there is at least 3-4 shots.  He is still taking naltrexone 50 mg daily, and was on a Librium taper when he was going through alcohol withdrawal previously.  The patient is very motivated to quit drinking alcohol at this time, and would like a refill of his librium to help prevent alcohol withdrawal symptoms. He is tachycardic right now, but has no other signs or symptoms of alcohol withdrawal at the moment. Last drink was yesterday evening. He has no asterixis on exam or tremors.  Plan: -Advised patient to cut down on alcohol consumption  -Librium taper (50 mg 3 times daily) and then 50 mg twice daily, then 50 mg daily, then 25 mg daily)

## 2022-11-25 NOTE — Patient Instructions (Signed)
Thank you, [name] for allowing [place] to provide your care today. Today we discussed:  Please take hydrochlorothiazide 25 mg everyday and amlodipine 5 mg everyday for your blood pressure  I have sent in a librium taper for your alcohol withdrawal You CANNOT drink while taking this medicine Please take naltrexone 50 mg daily while you take librium Take librium 50 mg three times a day for one day THEN Take librium 50 mg twice a day for one day THEN Take librium 50 mg once a day for one day THEN Take librium 25 mg once   I have ordered the following labs for you:  Lab Orders  No laboratory test(s) ordered today      Referrals ordered today:   Referral Orders  No referral(s) requested today     I have ordered the following medication/changed the following medications:   Stop the following medications: Medications Discontinued During This Encounter  Medication Reason   chlordiazePOXIDE (LIBRIUM) 25 MG capsule      Start the following medications: Meds ordered this encounter  Medications   chlordiazePOXIDE (LIBRIUM) 25 MG capsule    Sig: Take 2 capsules (50 mg total) by mouth 3 (three) times daily for 1 day, THEN 2 capsules (50 mg total) 2 (two) times daily for 1 day, THEN 2 capsules (50 mg total) daily for 1 day, THEN 1 capsule (25 mg total) daily for 1 day.    Dispense:  13 capsule    Refill:  0     Follow up: 2-3 months    Should you have any questions or concerns please call the internal medicine clinic at [phone].     Buddy Duty, D.O. [place]

## 2022-11-25 NOTE — Progress Notes (Signed)
CC: medication refills and follow up  HPI:  Darryl Berg is a 47 y.o. male living with a history stated below and presents today for medication refills and a follow up. Please see problem based assessment and plan for additional details.  Past Medical History:  Diagnosis Date   Depression    HIV disease ([place])    Substance abuse ([place])     Current Outpatient Medications on File Prior to Visit  Medication Sig Dispense Refill   albuterol (VENTOLIN HFA) 108 (90 Base) MCG/ACT inhaler Inhale 2 puffs into the lungs every 2 (two) hours as needed for wheezing or shortness of breath (cough). 8 g 0   amLODipine (NORVASC) 5 MG tablet Take 1 tablet (5 mg total) by mouth daily. 30 tablet 11   bictegravir-emtricitabine-tenofovir AF (BIKTARVY) 50-200-25 MG TABS tablet Take 1 tablet by mouth daily. 30 tablet 11   darunavir-cobicistat (PREZCOBIX) 800-150 MG tablet Take 1 tablet by mouth daily with breakfast. 30 tablet 11   folic acid (FOLVITE) 1 MG tablet Take 1 tablet (1 mg total) by mouth daily.     gabapentin (NEURONTIN) 300 MG capsule Take 1 capsule (300 mg total) by mouth at bedtime. 30 capsule 3   hydrochlorothiazide (HYDRODIURIL) 25 MG tablet Take 1 tablet (25 mg total) by mouth daily. 30 tablet 11   mupirocin ointment (BACTROBAN) 2 % Apply 1 Application topically 2 (two) times daily. 22 g 0   naltrexone (DEPADE) 50 MG tablet Take 1 tablet (50 mg total) by mouth daily. 30 tablet 2   omeprazole (PRILOSEC) 20 MG capsule Take 1 capsule (20 mg total) by mouth daily. 30 capsule 0   tadalafil (CIALIS) 10 MG tablet Take 1 tablet (10 mg total) by mouth daily as needed for erectile dysfunction. 20 tablet 3   thiamine (VITAMIN B-1) 50 MG tablet Take 1 tablet (50 mg total) by mouth daily.     vitamin B-12 (CYANOCOBALAMIN) 100 MCG tablet Take 1 tablet (100 mcg total) by mouth daily.     [DISCONTINUED] BIKTARVY 50-200-25 MG TABS tablet TAKE 1 TABLET BY MOUTH DAILY 30 tablet 5   [DISCONTINUED] diltiazem  (CARDIZEM) 60 MG tablet Take 60 mg by mouth 2 (two) times daily.       [DISCONTINUED] PREZCOBIX 800-150 MG tablet TAKE 1 TABLET BY MOUTH DAILY WITH BREAKFAST 30 tablet 5   No current facility-administered medications on file prior to visit.    Family History  Problem Relation Age of Onset   Hypertension Father     Social History   Socioeconomic History   Marital status: Single    Spouse name: Not on file   Number of children: Not on file   Years of education: Not on file   Highest education level: Not on file  Occupational History   Not on file  Tobacco Use   Smoking status: Never   Smokeless tobacco: Never  Vaping Use   Vaping Use: Never used  Substance and Sexual Activity   Alcohol use: Not Currently    Alcohol/week: 12.0 standard drinks of alcohol    Types: 12 Standard drinks or equivalent per week    Comment: weekends   Drug use: No   Sexual activity: Yes    Partners: Male    Birth control/protection: Condom    Comment: pt given condoms latex free  Other Topics Concern   Not on file  Social History Narrative   Not on file   Social Determinants of Radio broadcast assistant  Strain: Not on file  Food Insecurity: No Food Insecurity (10/13/2022)   Hunger Vital Sign    Worried About Running Out of Food in the Last Year: Never true    Ran Out of Food in the Last Year: Never true  Transportation Needs: Not on file  Physical Activity: Not on file  Stress: Not on file  Social Connections: Socially Isolated (10/13/2022)   Social Connection and Isolation Panel [NHANES]    Frequency of Communication with Friends and Family: More than three times a week    Frequency of Social Gatherings with Friends and Family: More than three times a week    Attends Religious Services: Never    Marine scientist or Organizations: No    Attends Archivist Meetings: Never    Marital Status: Separated  Intimate Partner Violence: Not At Risk (10/13/2022)   Humiliation,  Afraid, Rape, and Kick questionnaire    Fear of Current or Ex-Partner: No    Emotionally Abused: No    Physically Abused: No    Sexually Abused: No    Review of Systems: ROS negative except for what is noted on the assessment and plan.  Vitals:   11/25/22 1513 11/25/22 1544  BP: (!) 149/100 (!) 153/100  Pulse: (!) 110 (!) 102  Temp: 97.9 F (36.6 C)   TempSrc: Oral   SpO2: 100%   Weight: 137 lb 6.4 oz (62.3 kg)   Height: 5\' 6"  (1.676 m)     Physical Exam: Constitutional: well-appearing middle aged male sitting in chair, in no acute distress Cardiovascular: slightly tachycardic rate and regular rhythm Pulmonary/Chest: normal work of breathing on room air, lungs clear to auscultation bilaterally MSK: normal bulk and tone, no asterixis or resting upper extremity tremor Neurological: alert & oriented x 3, no focal deficit Skin: warm and dry Psych: normal mood and behavior  Assessment & Plan:    Patient discussed with Dr. Daryll Drown  Essential hypertension Blood pressure is elevated to 149/100 and 153/100 upon recheck.  The patient is taking HCTZ 25 mg daily, but notes that he is not taking amlodipine 5 mg daily (did not know about this medication).  He denies any headache, chest pain, or shortness of breath.    Plan: - Continue HCTZ 25 mg daily - Advised patient to restart amlodipine 5 mg daily (has refills at his pharmacy)  ABUSE, ALCOHOL, CONTINUOUS Patient states that he has still been drinking quite regularly.  He does not explicitly state how much he drinks, but he notes that there is at least 3-4 shots.  He is still taking naltrexone 50 mg daily, and was on a Librium taper when he was going through alcohol withdrawal previously.  The patient is very motivated to quit drinking alcohol at this time, and would like a refill of his librium to help prevent alcohol withdrawal symptoms. He is tachycardic right now, but has no other signs or symptoms of alcohol withdrawal at the  moment. Last drink was yesterday evening. He has no asterixis on exam or tremors.  Plan: -Advised patient to cut down on alcohol consumption  -Librium taper (50 mg 3 times daily) and then 50 mg twice daily, then 50 mg daily, then 25 mg daily)   Jabez Molner, D.O. [facility], PGY-2 Phone: [phone] Date 11/25/2022 Time 10:20 PM

## 2022-11-25 NOTE — Assessment & Plan Note (Signed)
Blood pressure is elevated to 149/100 and 153/100 upon recheck.  The patient is taking HCTZ 25 mg daily, but notes that he is not taking amlodipine 5 mg daily (did not know about this medication).  He denies any headache, chest pain, or shortness of breath.    Plan: - Continue HCTZ 25 mg daily - Advised patient to restart amlodipine 5 mg daily (has refills at his pharmacy)

## 2022-11-30 ENCOUNTER — Encounter: Payer: Self-pay | Admitting: Internal Medicine

## 2022-11-30 ENCOUNTER — Ambulatory Visit (INDEPENDENT_AMBULATORY_CARE_PROVIDER_SITE_OTHER): Payer: Self-pay | Admitting: Internal Medicine

## 2022-11-30 ENCOUNTER — Other Ambulatory Visit: Payer: Self-pay

## 2022-11-30 VITALS — BP 122/85 | HR 112 | Ht 66.0 in | Wt 142.0 lb

## 2022-11-30 DIAGNOSIS — B20 Human immunodeficiency virus [HIV] disease: Secondary | ICD-10-CM

## 2022-11-30 NOTE — Progress Notes (Signed)
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   AMLODIPINE (NORVASC) 5 MG TABLET    Take 1 tablet (5 mg total) by mouth daily.   BICTEGRAVIR-EMTRICITABINE-TENOFOVIR AF (BIKTARVY) 50-200-25 MG TABS TABLET    Take 1 tablet by mouth daily.   DARUNAVIR-COBICISTAT (PREZCOBIX) 800-150 MG TABLET    Take 1 tablet by mouth daily with breakfast.   FOLIC ACID (FOLVITE) 1 MG TABLET    Take 1 tablet (1 mg total) by mouth daily.   GABAPENTIN (NEURONTIN) 300 MG CAPSULE    Take 1 capsule (300 mg total) by mouth at bedtime.   HYDROCHLOROTHIAZIDE (HYDRODIURIL) 25 MG TABLET    Take 1 tablet (25 mg total) by mouth daily.   MUPIROCIN OINTMENT (BACTROBAN) 2 %    Apply 1 Application topically 2 (two) times daily.   NALTREXONE (DEPADE) 50 MG TABLET    Take 1 tablet (50 mg total) by mouth daily.   OMEPRAZOLE (PRILOSEC) 20 MG CAPSULE    Take 1 capsule (20 mg total) by mouth daily.   TADALAFIL (CIALIS) 10 MG TABLET    Take 1 tablet (10 mg total) by mouth daily as needed for  erectile dysfunction.   THIAMINE (VITAMIN B-1) 50 MG TABLET    Take 1 tablet (50 mg total) by mouth daily.   VITAMIN B-12 (CYANOCOBALAMIN) 100 MCG TABLET    Take 1 tablet (100 mcg total) by mouth daily.  Modified Medications   No medications on file  Discontinued Medications   No medications on file    Subjective: JP is in for his routine HIV follow-up visit.  He was in the last month and said that he was doing well and taking his Biktarvy and Prezcobix.  However is viral load had gone up to 3050 and his CD4 count did come down to 269.  He now tells me that he thinks he has missed about 11 doses the past month.  He says that he uses 2 pillboxes.  He usually tries to take his medication with breakfast before going to work but frequently forgets.  Review of Systems: Review of Systems  Constitutional:  Negative for fever and weight loss.    Past Medical History:  Diagnosis Date   Depression    HIV disease    Substance abuse     Social History   Tobacco Use   Smoking status: Never   Smokeless tobacco: Never  Vaping Use   Vaping Use: Never used  Substance Use Topics   Alcohol use:  Not Currently    Alcohol/week: 12.0 standard drinks of alcohol    Types: 12 Standard drinks or equivalent per week    Comment: weekends   Drug use: No    Family History  Problem Relation Age of Onset   Hypertension Father     No Known Allergies  Health Maintenance  Topic Date Due   COVID-19 Vaccine (1) Never done   COLONOSCOPY (Pts 45-10yrs Insurance coverage will need to be confirmed)  Never done   DTaP/Tdap/Td (2 - Td or Tdap) 07/18/2021   INFLUENZA VACCINE  03/31/2023   Hepatitis C Screening  Completed   HIV Screening  Completed   HPV VACCINES  Aged Out    Objective:  Vitals:   11/30/22 1557  BP: 122/85  Pulse: (!) 112  Weight: 142 lb (64.4 kg)  Height: 5\' 6"  (1.676 m)   Body mass index is 22.92 kg/m.  Physical Exam Constitutional:      Comments: He is calm and pleasant.   Cardiovascular:     Rate and Rhythm: Normal rate and regular rhythm.     Heart sounds: No murmur heard. Pulmonary:     Effort: Pulmonary effort is normal.     Breath sounds: Normal breath sounds.  Psychiatric:        Mood and Affect: Mood normal.     Lab Results Lab Results  Component Value Date   WBC 3.5 (L) 11/02/2022   HGB 13.7 11/02/2022   HCT 38.7 11/02/2022   MCV 97.7 11/02/2022   PLT 169 11/02/2022    Lab Results  Component Value Date   CREATININE 0.83 11/02/2022   BUN 11 11/02/2022   NA 137 11/02/2022   K 3.5 11/02/2022   CL 94 (L) 11/02/2022   CO2 29 11/02/2022    Lab Results  Component Value Date   ALT 36 11/02/2022   AST 78 (H) 11/02/2022   ALKPHOS 45 07/10/2022   BILITOT 0.8 11/02/2022    Lab Results  Component Value Date   CHOL 220 (H) 11/02/2022   HDL 95 11/02/2022   LDLCALC 106 (H) 11/02/2022   TRIG 97 11/02/2022   CHOLHDL 2.3 11/02/2022   Lab Results  Component Value Date   LABRPR NON-REACTIVE 11/02/2022   HIV 1 RNA Quant (Copies/mL)  Date Value  11/02/2022 3,050 (H)  07/27/2022 <20 (H)  03/17/2022 709 (H)   CD4 T Cell Abs (/uL)  Date Value  11/02/2022 269 (L)  07/27/2022 390 (L)  03/17/2022 389 (L)     Problem List Items Addressed This Visit       High   Human immunodeficiency virus (HIV) disease - Primary    He continues to struggle with adherence.  He already has some drug resistance explaining why he is on his current salvage regimen.  I told him the risk of continued poor adherence or progression of his infection with potential complications and more multidrug resistance requiring more complicated treatment regimens.  I encouraged him to take his Biktarvy and Prezcobix when he first wakes up in the morning and to set his cell phone alarm to remind him.  I will repeat his CD4 and viral load today along with resistance assays.      Relevant Orders   T-helper cells (CD4) count (not at [facility])   HIV-1 RNA quant-no reflex-bld    HIV-1 genotypr plus   HIV-1 Integrase Genotype      Michel Bickers, MD [facility] for [facility] [phone]  pager   [phone] cell 11/30/2022, 4:12 PM

## 2022-11-30 NOTE — Assessment & Plan Note (Signed)
He continues to struggle with adherence.  He already has some drug resistance explaining why he is on his current salvage regimen.  I told him the risk of continued poor adherence or progression of his infection with potential complications and more multidrug resistance requiring more complicated treatment regimens.  I encouraged him to take his Biktarvy and Prezcobix when he first wakes up in the morning and to set his cell phone alarm to remind him.  I will repeat his CD4 and viral load today along with resistance assays.

## 2022-12-01 LAB — T-HELPER CELLS (CD4) COUNT (NOT AT ARMC)
CD4 % Helper T Cell: 21 % — ABNORMAL LOW (ref 33–65)
CD4 T Cell Abs: 263 /uL — ABNORMAL LOW (ref 400–1790)

## 2022-12-02 NOTE — Progress Notes (Signed)
Internal Medicine Clinic Attending ° °Case discussed with Dr. Atway  at the time of the visit.  We reviewed the resident’s history and exam and pertinent patient test results.  I agree with the assessment, diagnosis, and plan of care documented in the resident’s note.  °

## 2022-12-12 LAB — HIV-1 GENOTYPING (RTI,PI,IN INHBTR): HIV-1 Genotype: DETECTED — AB

## 2022-12-12 LAB — HIV-1 RNA QUANT-NO REFLEX-BLD
HIV 1 RNA Quant: 1390 Copies/mL — ABNORMAL HIGH
HIV-1 RNA Quant, Log: 3.14 Log cps/mL — ABNORMAL HIGH

## 2022-12-16 ENCOUNTER — Telehealth: Payer: Self-pay

## 2023-01-05 ENCOUNTER — Telehealth: Payer: Self-pay

## 2023-01-05 DIAGNOSIS — B20 Human immunodeficiency virus [HIV] disease: Secondary | ICD-10-CM

## 2023-01-05 MED ORDER — PREZCOBIX 800-150 MG PO TABS: 1.0000 | ORAL_TABLET | Freq: Every day | ORAL | 1 refills | Status: DC

## 2023-01-05 MED ORDER — BIKTARVY 50-200-25 MG PO TABS: 1.0000 | ORAL_TABLET | Freq: Every day | ORAL | 1 refills | Status: DC

## 2023-01-05 NOTE — Telephone Encounter (Signed)
Spoke with JP and advised that refills will be sent. He is scheduled for a repeat VL 6/11.   Sandie Ano, RN

## 2023-01-05 NOTE — Telephone Encounter (Signed)
We could restart him, but lets have a viral load 1 month from starting  Thanks

## 2023-01-05 NOTE — Telephone Encounter (Signed)
Patient is requesting refills of Biktarvy and Prezcobix. The pharmacy is requesting new prescriptions since patient has not filled since 07/26/2022. Need provider approval for new Rx.   Sandie Ano, RN

## 2023-01-05 NOTE — Addendum Note (Signed)
Addended by: Linna Hoff D on: 01/05/2023 01:36 PM   Modules accepted: Orders

## 2023-01-11 NOTE — Telephone Encounter (Signed)
.  A user error has taken place: encounter opened in error

## 2023-01-31 ENCOUNTER — Encounter: Payer: Self-pay | Admitting: *Deleted

## 2023-02-03 ENCOUNTER — Other Ambulatory Visit (HOSPITAL_COMMUNITY): Payer: Self-pay

## 2023-02-08 ENCOUNTER — Other Ambulatory Visit: Payer: Self-pay

## 2023-02-08 DIAGNOSIS — B20 Human immunodeficiency virus [HIV] disease: Secondary | ICD-10-CM

## 2023-02-10 NOTE — Progress Notes (Signed)
The 10-year ASCVD risk score (Arnett DK, et al., 2019) is: 5.9%   Values used to calculate the score:     Age: 47 years     Sex: Male     Is Non-Hispanic African American: Yes     Diabetic: No     Tobacco smoker: No     Systolic Blood Pressure: 122 mmHg     Is BP treated: Yes     HDL Cholesterol: 95 mg/dL     Total Cholesterol: 220 mg/dL  Sandie Ano, RN

## 2023-02-11 ENCOUNTER — Encounter: Payer: Self-pay | Admitting: Internal Medicine

## 2023-02-11 LAB — HIV-1 RNA QUANT-NO REFLEX-BLD
HIV 1 RNA Quant: 37 Copies/mL — ABNORMAL HIGH
HIV-1 RNA Quant, Log: 1.57 Log cps/mL — ABNORMAL HIGH

## 2023-03-08 ENCOUNTER — Telehealth: Payer: Self-pay | Admitting: Pharmacist

## 2023-03-08 NOTE — Telephone Encounter (Signed)
JP requested a call from Cassie. Patient did not want to disclose the subject, only speak with Cassie. He can be reached at  [phone].

## 2023-04-15 ENCOUNTER — Encounter: Payer: Self-pay | Admitting: Student

## 2023-04-15 ENCOUNTER — Ambulatory Visit (INDEPENDENT_AMBULATORY_CARE_PROVIDER_SITE_OTHER): Payer: Self-pay | Admitting: Student

## 2023-04-15 ENCOUNTER — Other Ambulatory Visit: Payer: Self-pay

## 2023-04-15 ENCOUNTER — Other Ambulatory Visit (HOSPITAL_COMMUNITY): Payer: Self-pay

## 2023-04-15 VITALS — BP 133/88 | HR 95 | Temp 98.4°F | Ht 69.0 in | Wt 145.2 lb

## 2023-04-15 DIAGNOSIS — F1010 Alcohol abuse, uncomplicated: Secondary | ICD-10-CM

## 2023-04-15 DIAGNOSIS — Z9889 Other specified postprocedural states: Secondary | ICD-10-CM

## 2023-04-15 DIAGNOSIS — I10 Essential (primary) hypertension: Secondary | ICD-10-CM

## 2023-04-15 MED ORDER — CHLORDIAZEPOXIDE HCL 25 MG PO CAPS
ORAL_CAPSULE | ORAL | 0 refills | Status: AC
Start: 2023-04-15 — End: 2023-04-19
  Filled 2023-04-15: qty 13, 4d supply, fill #0

## 2023-04-15 MED ORDER — HYDROCHLOROTHIAZIDE 25 MG PO TABS
25.0000 mg | ORAL_TABLET | Freq: Every day | ORAL | 11 refills | Status: DC
  Filled 2023-04-15: qty 30, 30d supply, fill #0

## 2023-04-15 NOTE — Progress Notes (Signed)
Established Patient Office Visit  Subjective   Patient ID: Darryl Berg, male    DOB: 05/01/1976  Age: 47 y.o. MRN: [id number]  Chief Complaint  Patient presents with   Follow-up    ROUTINE OFFICE VISIT / WART ON LEFT HAND AND PHYSICAL EXAM    Darryl Berg is a 47 year old male past medical history significant for alcohol use disorder, hypertension, and HIV who presents to the office today for evaluation of a wart on his anterior left fourth finger and to discuss alcohol cessation. Please see problem based assessment and plan for additional details.     Past Medical History:  Diagnosis Date   Depression    HIV disease (HCC)    Substance abuse (HCC)    ROS: NO SI or HI    Objective:     BP 133/88 (BP Location: Right Arm, Patient Position: Sitting, Cuff Size: Normal)   Pulse 95   Temp 98.4 F (36.9 C) (Oral)   Ht 5\' 9"  (1.753 m)   Wt 145 lb 3.2 oz (65.9 kg)   SpO2 100%   BMI 21.44 kg/m  BP Readings from Last 3 Encounters:  04/15/23 133/88  11/30/22 122/85  11/25/22 (!) 153/100      Physical Exam Constitutional:      Appearance: He is normal weight.  Cardiovascular:     Rate and Rhythm: Normal rate and regular rhythm.     Heart sounds: Normal heart sounds. No murmur heard. Pulmonary:     Effort: Pulmonary effort is normal.     Breath sounds: Normal breath sounds.  Musculoskeletal:       Hands:     Comments: Wart present on anterior left fourth finger,  Skin:    General: Skin is warm and dry.  Neurological:     Mental Status: He is alert and oriented to person, place, and time.  Psychiatric:        Mood and Affect: Mood normal.      No results found for any visits on 04/15/23.  Last CBC Lab Results  Component Value Date   WBC 3.5 (L) 11/02/2022   HGB 13.7 11/02/2022   HCT 38.7 11/02/2022   MCV 97.7 11/02/2022   MCH 34.6 (H) 11/02/2022   RDW 17.2 (H) 11/02/2022   PLT 169 11/02/2022   Last metabolic panel Lab Results  Component Value  Date   GLUCOSE 95 11/02/2022   NA 137 11/02/2022   K 3.5 11/02/2022   CL 94 (L) 11/02/2022   CO2 29 11/02/2022   BUN 11 11/02/2022   CREATININE 0.83 11/02/2022   GFRNONAA >60 07/10/2022   CALCIUM 9.7 11/02/2022   PROT 7.7 11/02/2022   ALBUMIN 4.1 07/10/2022   BILITOT 0.8 11/02/2022   ALKPHOS 45 07/10/2022   AST 78 (H) 11/02/2022   ALT 36 11/02/2022   ANIONGAP 20 (H) 07/10/2022   Last lipids Lab Results  Component Value Date   CHOL 220 (H) 11/02/2022   HDL 95 11/02/2022   LDLCALC 106 (H) 11/02/2022   TRIG 97 11/02/2022   CHOLHDL 2.3 11/02/2022   Last hemoglobin A1c No results found for: "HGBA1C"      04/15/2023   11:11 AM 11/30/2022    3:58 PM 11/25/2022    3:45 PM  PHQ9 SCORE ONLY  PHQ-9 Total Score 0 0 6    The 10-year ASCVD risk score (Arnett DK, et al., 2019) is: 6.9%    Assessment & Plan:   Problem List Items Addressed This  Visit       Cardiovascular and Mediastinum   Essential hypertension    Patient has a history of essential hypertension that is currently not at goal.  He reports compliance with amlodipine 5 mg daily but stated that he was asked to stop taking the hydrochlorothiazide 25 mg a day.  Per last office visit, he was asked to continue the hydrochlorothiazide and the amlodipine.  Hypertension is currently well-controlled likely due to decrease in medication adherence to hydrochlorothiazide. Plan -Patient advised to begin hydrochlorothiazide 25 mg -Continue amlodipine 5 mg -Patient will return in 4 weeks for a nurse and lab visit for BMP       Relevant Medications   hydrochlorothiazide (HYDRODIURIL) 25 MG tablet   Other Relevant Orders   BMP8+Anion Gap     Other   ABUSE, ALCOHOL, CONTINUOUS    Patient has a long history of alcohol use disorder.  In March 2024, he began a Librium taper and was able to achieve sobriety from alcohol until July.  Patient reports that he started drinking in the beginning of July and has been drinking about 4-5  shots a day since then.  He stated that if he does not drink alcohol, he will develop tremors which impacts his driving and job.  Patient has not been compliant with naltrexone.  After discussing options, the patient would like to begin the Librium taper and discontinue alcohol today.  He tolerated the previous Librium taper and alcohol cessation well, the patient, Dr. Mikey Bussing, and I are in agreement to provide a Librium taper for alcohol cessation today. Plan -Patient was prescribed a Librium taper that consists of 50 mg 3 times a day, 50 mg twice daily after that, then 50 mg daily, and then 25 mg daily. -Patient was provide members for a local AA group and ADS -Patient was instructed to begin naltrexone after he finishes a Librium taper      S/P cryotherapy of skin lesion - Primary    Patient with history of cryotherapy performed on a wart on her left anterior fourth finger that was unsuccessful.  He stated that the wart decreased in size but grew back to the original size.  Physical exam is remarkable for a large wart on the left anterior fourth finger. Plan -Due to failure of cryotherapy, patient would like to get dermatology -Dermatology referral placed      Relevant Orders   Ambulatory referral to Dermatology       Return in about 4 weeks (around 05/13/2023), or Lab for BMP and nursing BP, for MD 3 months .    Faith Rogue, DO

## 2023-04-15 NOTE — Patient Instructions (Addendum)
Lee's pick up the prescription for Librium (chlordiazepoxide)  the pharmacy.  You will take the medication as prescribed, this will help with the tremors and decrease the alcohol withdrawal symptoms.  While you are taking this medication do not drink alcohol.  After you have finished the course of Librium please can begin naltrexone.  You must take this medication every day for it to work.  Please call ADS at [phone]  Please call the local AA at [phone]  Please take both hydrochlorothiazide and amlodipine.

## 2023-04-15 NOTE — Assessment & Plan Note (Addendum)
Patient has a history of essential hypertension that is currently not at goal.  He reports compliance with amlodipine 5 mg daily but stated that he was asked to stop taking the hydrochlorothiazide 25 mg a day.  Per last office visit, he was asked to continue the hydrochlorothiazide and the amlodipine.  Hypertension is currently well-controlled likely due to decrease in medication adherence to hydrochlorothiazide. Plan -Patient advised to begin hydrochlorothiazide 25 mg -Continue amlodipine 5 mg -Patient will return in 4 weeks for a nurse and lab visit for BMP

## 2023-04-15 NOTE — Assessment & Plan Note (Addendum)
Patient has a long history of alcohol use disorder.  In March 2024, he began a Librium taper and was able to achieve sobriety from alcohol until July.  Patient reports that he started drinking in the beginning of July and has been drinking about 4-5 shots a day since then.  He stated that if he does not drink alcohol, he will develop tremors which impacts his driving and job.  Patient has not been compliant with naltrexone.  After discussing options, the patient would like to begin the Librium taper and discontinue alcohol today.  He tolerated the previous Librium taper and alcohol cessation well, the patient, Dr. Mikey Bussing, and I are in agreement to provide a Librium taper for alcohol cessation today. Plan -Patient was prescribed a Librium taper that consists of 50 mg 3 times a day, 50 mg twice daily after that, then 50 mg daily, and then 25 mg daily. -Patient was provide members for a local AA group and ADS -Patient was instructed to begin naltrexone after he finishes a Librium taper

## 2023-04-15 NOTE — Assessment & Plan Note (Signed)
Patient with history of cryotherapy performed on a wart on her left anterior fourth finger that was unsuccessful.  He stated that the wart decreased in size but grew back to the original size.  Physical exam is remarkable for a large wart on the left anterior fourth finger. Plan -Due to failure of cryotherapy, patient would like to get dermatology -Dermatology referral placed

## 2023-04-20 NOTE — Progress Notes (Signed)
Internal Medicine Clinic Attending  I was physically present during the key portions of the resident provided service and participated in the medical decision making of patient's management care. I reviewed pertinent patient test results.  The assessment, diagnosis, and plan were formulated together and I agree with the documentation in the resident's note.  Gust Rung, DO

## 2023-04-21 ENCOUNTER — Other Ambulatory Visit: Payer: Self-pay

## 2023-04-21 DIAGNOSIS — Z113 Encounter for screening for infections with a predominantly sexual mode of transmission: Secondary | ICD-10-CM

## 2023-04-21 DIAGNOSIS — B20 Human immunodeficiency virus [HIV] disease: Secondary | ICD-10-CM

## 2023-04-22 ENCOUNTER — Other Ambulatory Visit (HOSPITAL_COMMUNITY): Payer: Self-pay

## 2023-04-22 LAB — URINE CYTOLOGY ANCILLARY ONLY
Chlamydia: NEGATIVE
Comment: NEGATIVE
Comment: NORMAL
Neisseria Gonorrhea: NEGATIVE

## 2023-04-22 LAB — T-HELPER CELL (CD4) - (RCID CLINIC ONLY)
CD4 % Helper T Cell: 25 % — ABNORMAL LOW (ref 33–65)
CD4 T Cell Abs: 395 /uL — ABNORMAL LOW (ref 400–1790)

## 2023-04-23 LAB — COMPLETE METABOLIC PANEL WITH GFR
AG Ratio: 1.5 (calc) (ref 1.0–2.5)
ALT: 49 U/L — ABNORMAL HIGH (ref 9–46)
AST: 69 U/L — ABNORMAL HIGH (ref 10–40)
Albumin: 4.1 g/dL (ref 3.6–5.1)
Alkaline phosphatase (APISO): 48 U/L (ref 36–130)
BUN/Creatinine Ratio: 7 (calc) (ref 6–22)
BUN: 6 mg/dL — ABNORMAL LOW (ref 7–25)
CO2: 30 mmol/L (ref 20–32)
Calcium: 8.6 mg/dL (ref 8.6–10.3)
Chloride: 104 mmol/L (ref 98–110)
Creat: 0.88 mg/dL (ref 0.60–1.29)
Globulin: 2.8 g/dL (ref 1.9–3.7)
Glucose, Bld: 91 mg/dL (ref 65–99)
Potassium: 4 mmol/L (ref 3.5–5.3)
Sodium: 140 mmol/L (ref 135–146)
Total Bilirubin: 0.6 mg/dL (ref 0.2–1.2)
Total Protein: 6.9 g/dL (ref 6.1–8.1)
eGFR: 107 mL/min/{1.73_m2} (ref >=60)

## 2023-04-23 LAB — CBC WITH DIFFERENTIAL/PLATELET
Absolute Monocytes: 459 {cells}/uL (ref 200–950)
Basophils Absolute: 19 {cells}/uL (ref 0–200)
Basophils Relative: 0.6 %
Eosinophils Absolute: 31 {cells}/uL (ref 15–500)
Eosinophils Relative: 1 %
HCT: 39.2 % (ref 38.5–50.0)
Hemoglobin: 13.3 g/dL (ref 13.2–17.1)
Lymphs Abs: 1863 {cells}/uL (ref 850–3900)
MCH: 33.8 pg — ABNORMAL HIGH (ref 27.0–33.0)
MCHC: 33.9 g/dL (ref 32.0–36.0)
MCV: 99.5 fL (ref 80.0–100.0)
MPV: 10 fL (ref 7.5–12.5)
Monocytes Relative: 14.8 %
Neutro Abs: 729 {cells}/uL — ABNORMAL LOW (ref 1500–7800)
Neutrophils Relative %: 23.5 %
Platelets: 191 10*3/uL (ref 140–400)
RBC: 3.94 10*6/uL — ABNORMAL LOW (ref 4.20–5.80)
RDW: 14 % (ref 11.0–15.0)
Total Lymphocyte: 60.1 %
WBC: 3.1 10*3/uL — ABNORMAL LOW (ref 3.8–10.8)

## 2023-04-23 LAB — HIV-1 RNA QUANT-NO REFLEX-BLD
HIV 1 RNA Quant: 11100 {copies}/mL — ABNORMAL HIGH
HIV-1 RNA Quant, Log: 4.05 {Log_copies}/mL — ABNORMAL HIGH

## 2023-04-23 LAB — RPR: RPR Ser Ql: NONREACTIVE

## 2023-04-25 ENCOUNTER — Other Ambulatory Visit: Payer: Self-pay

## 2023-04-25 ENCOUNTER — Telehealth: Payer: Self-pay

## 2023-04-25 DIAGNOSIS — N529 Male erectile dysfunction, unspecified: Secondary | ICD-10-CM

## 2023-04-25 MED ORDER — TADALAFIL 10 MG PO TABS
10.0000 mg | ORAL_TABLET | Freq: Every day | ORAL | 3 refills | Status: AC | PRN
Start: 2023-04-25 — End: ?

## 2023-04-25 NOTE — Telephone Encounter (Signed)
Received refill request for tadalafil - please advise if okay to refill.

## 2023-04-25 NOTE — Telephone Encounter (Signed)
Thats fine  Thanks Tiff

## 2023-04-29 ENCOUNTER — Telehealth: Payer: Self-pay

## 2023-04-29 NOTE — Telephone Encounter (Signed)
Received call from Rob Hickman Counselor with [facility] requesting refill on Naltrexone 50 mg tablet. Was previously seen by Dr. Orvan Falconer, but is now following Dr. Renold Don.  Will forward message to provider. Juanita Laster, RMA

## 2023-04-29 NOTE — Telephone Encounter (Signed)
I am not doing any of those pain or detox meds  Needs pcp or pain clinic for that

## 2023-05-03 NOTE — Telephone Encounter (Signed)
Updated bridge counselor and requested they follow up with PCP regarding refill request.  Juanita Laster, RMA

## 2023-05-05 ENCOUNTER — Ambulatory Visit: Payer: Self-pay | Admitting: Internal Medicine

## 2023-05-06 ENCOUNTER — Other Ambulatory Visit: Payer: Self-pay | Admitting: Internal Medicine

## 2023-05-06 DIAGNOSIS — B20 Human immunodeficiency virus [HIV] disease: Secondary | ICD-10-CM

## 2023-05-16 ENCOUNTER — Telehealth: Payer: Self-pay

## 2023-05-16 NOTE — Telephone Encounter (Signed)
Detectable Viral Load Intervention (DVL)  Most recent VL:  HIV 1 RNA Quant  Date Value Ref Range Status  04/21/2023 11,100 (H) Copies/mL Final  02/08/2023 37 (H) Copies/mL Final  11/30/2022 1,390 (H) Copies/mL Final    Last Clinic Visit:  04/21/23  Current ART regimen: Biktarvy and Prezcobix  Appointment status: patient has future appointment scheduled  Medication last dispensed (per chart review):    Dispensed Days Supply Quantity Provider Pharmacy  BIKTARVY 50/200/25MG  TABLETS 05/09/2023 30 30 each Vu, Tonita Phoenix, MD [facility]...    Dispensed Days Supply Quantity Provider Pharmacy  PREZCOBIX 800MG -150MG  TABLETS 05/09/2023 30 30 each Vu, Tonita Phoenix, MD [facility] #...    Medication Adherence   What pharmacy do you use for your ART?   [id number] - [place], [place] - [address]    Do you pick up your medication at the pharmacy or is it mailed to you? pick up at pharmacy  How often do you miss a dose your ART? never or almost never  Are you experiencing any side effects with your ART?  no  Are you having any trouble remembering what medication(s) you are supposed to take or how you are supposed to take them?  no  What helps you remember to take your medication(s)?  Pill box    Barriers to Care   Lack of transportation to medical appointments?  Sometimes  2. Housing instability?  no  3. If you are currently employed, are you having difficulty taking time off of work for medical appointments?  Employed, no issues with appts  4. Financial concerns (rent, utilities, etc.)  Rent.   5. Lack of consistent access to food?  no  6. Trouble remembering and attending your appointments?  No  7. Are you experiencing any other barriers that make it hard for you to come to appointments or take medication regularly?  No    Interventions   Called patient to discuss medication adherence and possible barriers to care. Denies having issues  getting medication. Is working with [employer], Bridge Counselor  Juanita Laster, RMA

## 2023-05-21 ENCOUNTER — Other Ambulatory Visit: Payer: Self-pay | Admitting: Student

## 2023-05-21 DIAGNOSIS — R202 Paresthesia of skin: Secondary | ICD-10-CM

## 2023-05-27 ENCOUNTER — Other Ambulatory Visit: Payer: Self-pay

## 2023-05-27 ENCOUNTER — Ambulatory Visit (INDEPENDENT_AMBULATORY_CARE_PROVIDER_SITE_OTHER): Payer: Self-pay | Admitting: Internal Medicine

## 2023-05-27 ENCOUNTER — Encounter: Payer: Self-pay | Admitting: Internal Medicine

## 2023-05-27 VITALS — BP 113/78 | HR 77 | Resp 16 | Ht 69.0 in | Wt 155.0 lb

## 2023-05-27 DIAGNOSIS — B20 Human immunodeficiency virus [HIV] disease: Secondary | ICD-10-CM

## 2023-05-27 DIAGNOSIS — Z23 Encounter for immunization: Secondary | ICD-10-CM

## 2023-05-27 DIAGNOSIS — N529 Male erectile dysfunction, unspecified: Secondary | ICD-10-CM

## 2023-05-27 DIAGNOSIS — Z113 Encounter for screening for infections with a predominantly sexual mode of transmission: Secondary | ICD-10-CM

## 2023-05-27 DIAGNOSIS — I2585 Chronic coronary microvascular dysfunction: Secondary | ICD-10-CM

## 2023-05-27 MED ORDER — ATORVASTATIN CALCIUM 20 MG PO TABS: 20.0000 mg | ORAL_TABLET | Freq: Every day | ORAL | 11 refills | Status: DC

## 2023-05-27 MED ORDER — TADALAFIL 20 MG PO TABS: 20.0000 mg | ORAL_TABLET | Freq: Every day | ORAL | 5 refills | Status: DC | PRN
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
## 2023-05-27 NOTE — Patient Instructions (Addendum)
Continue your biktarvy and prezcobix, and please make sure you take that every day   Start atorvastatin 20 mg daily to reduce risk of heart attack/stroke. If you have muscle ache, nausea, vomiting, yellowing skin, please stop this and let me know right away    Please refrain from using alcohol    Vaccine next visit: Pertussis/tetanus Influenza    See [place] in 6 weeks

## 2023-05-28 LAB — C. TRACHOMATIS/N. GONORRHOEAE RNA
C. trachomatis RNA, TMA: NOT DETECTED
N. gonorrhoeae RNA, TMA: NOT DETECTED

## 2023-05-30 LAB — HIV-1 RNA QUANT-NO REFLEX-BLD
HIV 1 RNA Quant: 78 {copies}/mL — ABNORMAL HIGH
HIV-1 RNA Quant, Log: 1.89 {Log} — ABNORMAL HIGH

## 2023-07-04 ENCOUNTER — Telehealth: Payer: Self-pay

## 2023-07-04 NOTE — Telephone Encounter (Signed)
Patient called he is requesting a rx refill for Librium, the medication has expired off of the med.list. patient is requesting the rx to be sent to [facility] pharmacy on [address].

## 2023-07-05 ENCOUNTER — Other Ambulatory Visit: Payer: Self-pay

## 2023-07-05 DIAGNOSIS — F1010 Alcohol abuse, uncomplicated: Secondary | ICD-10-CM

## 2023-07-05 MED ORDER — NALTREXONE HCL 50 MG PO TABS
50.0000 mg | ORAL_TABLET | Freq: Every day | ORAL | 2 refills | Status: DC
Start: 2023-07-05 — End: 2023-07-08

## 2023-07-05 NOTE — Telephone Encounter (Signed)
Patient called he stated he didn't know he was supposed to be taking the naltrexone after finishing the Librium. I advised patient of the previous message below. Patient understand he is requesting a rx refill for naltrexone.

## 2023-07-05 NOTE — Telephone Encounter (Signed)
Unable to reach the patient I left a voicemail for him to give [place] a call back.

## 2023-07-06 NOTE — Telephone Encounter (Addendum)
Pt informed after completing Librium, start taking Naltrexone (see telephone encounter on 11/4). Pt stated he knows but  he needs another rx for Librium before starting Naltrexone. Informed pt he will need an appt; he agreed, prefers in-person appt. Appt schedule w/Dr Masters on Friday 11/8.

## 2023-07-08 ENCOUNTER — Ambulatory Visit (INDEPENDENT_AMBULATORY_CARE_PROVIDER_SITE_OTHER): Payer: Self-pay | Admitting: Internal Medicine

## 2023-07-08 ENCOUNTER — Encounter: Payer: Self-pay | Admitting: Internal Medicine

## 2023-07-08 ENCOUNTER — Other Ambulatory Visit: Payer: Self-pay

## 2023-07-08 VITALS — BP 134/85 | HR 86 | Resp 16 | Ht 66.0 in | Wt 145.8 lb

## 2023-07-08 VITALS — BP 112/72 | HR 98 | Temp 97.7°F | Ht 66.0 in | Wt 145.1 lb

## 2023-07-08 DIAGNOSIS — Z111 Encounter for screening for respiratory tuberculosis: Secondary | ICD-10-CM

## 2023-07-08 DIAGNOSIS — F1010 Alcohol abuse, uncomplicated: Secondary | ICD-10-CM

## 2023-07-08 DIAGNOSIS — Z23 Encounter for immunization: Secondary | ICD-10-CM

## 2023-07-08 DIAGNOSIS — B20 Human immunodeficiency virus [HIV] disease: Secondary | ICD-10-CM

## 2023-07-08 DIAGNOSIS — R0989 Other specified symptoms and signs involving the circulatory and respiratory systems: Secondary | ICD-10-CM

## 2023-07-08 DIAGNOSIS — R6883 Chills (without fever): Secondary | ICD-10-CM | POA: Insufficient documentation

## 2023-07-08 DIAGNOSIS — Z1159 Encounter for screening for other viral diseases: Secondary | ICD-10-CM

## 2023-07-08 MED ORDER — NALTREXONE HCL 50 MG PO TABS: 50.0000 mg | ORAL_TABLET | Freq: Every day | ORAL | 2 refills | Status: DC

## 2023-07-08 MED ORDER — TADALAFIL 20 MG PO TABS: 20.0000 mg | ORAL_TABLET | Freq: Every day | ORAL | 5 refills | Status: DC | PRN

## 2023-07-08 MED ORDER — CHLORDIAZEPOXIDE HCL 25 MG PO CAPS: ORAL_CAPSULE | ORAL | 0 refills | Status: AC

## 2023-07-08 NOTE — Assessment & Plan Note (Addendum)
Patient presents requesting help with withdrawing from alcohol. He has long standing history of alcohol abuse dating back to the 90s. He states that he is happier when he isnt drinking and he would like to stop. He has a full time job, recently got promoted and is afraid of having withdrawal symptoms at work or them smelling alcohol on him. He has received 2 librium tapers this year in March and August. In the past he went to AA and found this to be helpful. No history of withdrawal seizures. He has been drinking about 8 shots daily for last 3 weeks. Last drink was prior to work shift at [employer] yesterday. A: Mildly tachycardic, not currently tremulous or anxious. We talked about librium taper and indications to return to emergency room if symptoms are not well controlled P: -Librium taper over 4 days -Start naltrexone following this -11/22 follow-up scheduled  -He plans to start back with AA group close to his place -Referral to behavioral health, patient is open to talking with therapist. I think maximizing support will set him up for success. He is plugged into [employer] where he has a Child psychotherapist who he trusts.

## 2023-07-08 NOTE — Progress Notes (Signed)
Librium taper 08/16 Naltrexone     Subjective:  CC: alcohol withdrawal symptoms  HPI:  Darryl Berg is a 47 y.o. male with a past medical history stated below and presents today for help with withdrawing from alcohol. He took librum taper in August and was sober for about 2 months.  He met someone a few weeks ago and he feels that they lead him to start drinking again. He has been drinking up to 8 shots daily. He works 3rd shift at [employer] and has been taking a shot before going to work to avoid withdraw symptoms. No prior history of seizures from withdrawal. Symptoms are with tremor, chills, and anxiety.    Please see problem based assessment and plan for additional details.  Past Medical History:  Diagnosis Date   Depression    HIV disease (HCC)    Substance abuse (HCC)     Current Outpatient Medications on File Prior to Visit  Medication Sig Dispense Refill   albuterol (VENTOLIN HFA) 108 (90 Base) MCG/ACT inhaler Inhale 2 puffs into the lungs every 2 (two) hours as needed for wheezing or shortness of breath (cough). (Patient not taking: Reported on 07/08/2023) 8 g 0   amLODipine (NORVASC) 5 MG tablet Take 1 tablet (5 mg total) by mouth daily. 30 tablet 11   atorvastatin (LIPITOR) 20 MG tablet Take 1 tablet (20 mg total) by mouth daily. 30 tablet 11   bictegravir-emtricitabine-tenofovir AF (BIKTARVY) 50-200-25 MG TABS tablet TAKE 1 TABLET BY MOUTH DAILY 30 tablet 0   darunavir-cobicistat (PREZCOBIX) 800-150 MG tablet TAKE 1 TABLET BY MOUTH DAILY WITH BREAKFAST 30 tablet 0   folic acid (FOLVITE) 1 MG tablet Take 1 tablet (1 mg total) by mouth daily.     gabapentin (NEURONTIN) 300 MG capsule TAKE ONE CAPSULE BY MOUTH AT BEDTIME 30 capsule 3   hydrochlorothiazide (HYDRODIURIL) 25 MG tablet Take 1 tablet (25 mg total) by mouth daily. 30 tablet 11   mupirocin ointment (BACTROBAN) 2 % Apply 1 Application topically 2 (two) times daily. 22 g 0   omeprazole (PRILOSEC) 20 MG capsule  Take 1 capsule (20 mg total) by mouth daily. 30 capsule 0   tadalafil (CIALIS) 20 MG tablet Take 1 tablet (20 mg total) by mouth daily as needed for erectile dysfunction. 10 tablet 5   thiamine (VITAMIN B-1) 50 MG tablet Take 1 tablet (50 mg total) by mouth daily.     vitamin B-12 (CYANOCOBALAMIN) 100 MCG tablet Take 1 tablet (100 mcg total) by mouth daily.     [DISCONTINUED] BIKTARVY 50-200-25 MG TABS tablet TAKE 1 TABLET BY MOUTH DAILY 30 tablet 5   [DISCONTINUED] diltiazem (CARDIZEM) 60 MG tablet Take 60 mg by mouth 2 (two) times daily.       [DISCONTINUED] PREZCOBIX 800-150 MG tablet TAKE 1 TABLET BY MOUTH DAILY WITH BREAKFAST 30 tablet 5   No current facility-administered medications on file prior to visit.    Family History  Problem Relation Age of Onset   Hypertension Father     Social History   Socioeconomic History   Marital status: Single    Spouse name: Not on file   Number of children: Not on file   Years of education: Not on file   Highest education level: Not on file  Occupational History   Not on file  Tobacco Use   Smoking status: Never   Smokeless tobacco: Never  Vaping Use   Vaping status: Never Used  Substance and Sexual Activity  Alcohol use: Not Currently    Alcohol/week: 12.0 standard drinks of alcohol    Types: 12 Standard drinks or equivalent per week    Comment: weekends   Drug use: No   Sexual activity: Yes    Partners: Male    Birth control/protection: Condom    Comment: pt given condoms latex free  Other Topics Concern   Not on file  Social History Narrative   Not on file   Social Determinants of Health   Financial Resource Strain: Not on file  Food Insecurity: No Food Insecurity (10/13/2022)   Hunger Vital Sign    Worried About Running Out of Food in the Last Year: Never true    Ran Out of Food in the Last Year: Never true  Transportation Needs: Not on file  Physical Activity: Not on file  Stress: Not on file  Social Connections:  Socially Isolated (10/13/2022)   Social Connection and Isolation Panel [NHANES]    Frequency of Communication with Friends and Family: More than three times a week    Frequency of Social Gatherings with Friends and Family: More than three times a week    Attends Religious Services: Never    Database administrator or Organizations: No    Attends Banker Meetings: Never    Marital Status: Separated  Intimate Partner Violence: Not At Risk (10/13/2022)   Humiliation, Afraid, Rape, and Kick questionnaire    Fear of Current or Ex-Partner: No    Emotionally Abused: No    Physically Abused: No    Sexually Abused: No    Review of Systems: ROS negative except for what is noted on the assessment and plan.  Objective:   Vitals:   07/08/23 0821  BP: 112/72  Pulse: 98  Temp: 97.7 F (36.5 C)  TempSrc: Oral  SpO2: 98%  Weight: 145 lb 1.6 oz (65.8 kg)  Height: 5\' 6"  (1.676 m)    Physical Exam: Constitutional: well-appearing  Cardiovascular: regular rate and rhythm, no m/r/g Pulmonary/Chest: normal work of breathing on room air, lungs clear to auscultation bilaterally Abdominal: soft, non-tender, non-distended MSK: non tremulous Neurological: alert & oriented x 3, normal gait Skin: warm and dry Psych: calm     Assessment & Plan:  ABUSE, ALCOHOL, CONTINUOUS Patient presents requesting help with withdrawing from alcohol. He has long standing history of alcohol abuse dating back to the 90s. He states that he is happier when he isnt drinking and he would like to stop. He has a full time job, recently got promoted and is afraid of having withdrawal symptoms at work or them smelling alcohol on him. He has received 2 librium tapers this year in March and August. In the past he went to AA and found this to be helpful. No history of withdrawal seizures. He has been drinking about 8 shots daily for last 3 weeks. Last drink was prior to work shift at [employer] yesterday. A: Mildly  tachycardic, not currently tremulous or anxious. We talked about librium taper and indications to return to emergency room if symptoms are not well controlled P: -Librium taper over 4 days -Start naltrexone following this -11/22 follow-up scheduled  -He plans to start back with AA group close to his place -Referral to behavioral health, patient is open to talking with therapist. I think maximizing support will set him up for success. He is plugged into [employer] where he has a Child psychotherapist who he trusts.  Chills (without fever) He reports an  episode of feel flushed followed by chills at work a few nights ago. He also had some chest tightness when he felt anxious while having tremors a few days ago. He is concerned for covid. Denies sick contacts. He has not had sinus congestion, cough, denies feeling short of breath, sore throat. A: Low suspicion for viral infection. Physical exam was reassuring. Symptoms most likely due to alcohol withdrawal P: We talked about in clinic testing for COVID/Flu. Lab staff is unsure about what cost would be. Patient plans to [facility] when he picks up medications later today.    Patient discussed with Dr. Hurshel Keys Phoenix Dresser, D.O. [facility]  PGY-3 Pager: [phone]  Phone: [phone] Date 07/08/2023  Time 7:44 PM
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
## 2023-07-08 NOTE — Patient Instructions (Signed)
Tdap shot today   Hepatitis b shot today   Blood tests   Continue biktarvy and prezcobix    See me in 6 months

## 2023-07-08 NOTE — Addendum Note (Signed)
Addended by: Clayborne Artist A on: 07/08/2023 12:16 PM   Modules accepted: Orders

## 2023-07-08 NOTE — Patient Instructions (Addendum)
Thank you, [name] for allowing [place] to provide your care today.   Alcohol withdraw I have sent in librium for the next 4 days. This should help with the symptoms you have been having. If you are taking this medication and still have symptoms, you need to go to the ED this weekend. Be sure to pick up naltrexone to start after the librium taper. I have referred you to counseling as we need to help you find a good support group.  Referrals ordered today:   Referral Orders         Ambulatory referral to Integrated Behavioral Health      I have ordered the following medication/changed the following medications:   Stop the following medications: Medications Discontinued During This Encounter  Medication Reason   naltrexone (DEPADE) 50 MG tablet Reorder     Start the following medications: Meds ordered this encounter  Medications   chlordiazePOXIDE (LIBRIUM) 25 MG capsule    Sig: Take 2 capsules (50 mg total) by mouth 4 (four) times daily for 1 day, THEN 2 capsules (50 mg total) 3 (three) times daily for 1 day, THEN 2 capsules (50 mg total) 2 (two) times daily for 1 day, THEN 2 capsules (50 mg total) at bedtime for 1 day.    Dispense:  20 capsule    Refill:  0   naltrexone (DEPADE) 50 MG tablet    Sig: Take 1 tablet (50 mg total) by mouth daily.    Dispense:  30 tablet    Refill:  2     Follow up:  2 weeks    We look forward to seeing you next time. Please call our clinic at [phone] if you have any questions or concerns. The best time to call is Monday-Friday from 9am-4pm, but there is someone available 24/7. If after hours or the weekend, call the main hospital number and ask for the Internal Medicine Resident On-Call. If you need medication refills, please notify your pharmacy one week in advance and they will send [place] a request.   Thank you for trusting me with your care. Wishing you the best!   Rudene Christians, DO [facility]

## 2023-07-08 NOTE — Assessment & Plan Note (Signed)
He reports an episode of feel flushed followed by chills at work a few nights ago. He also had some chest tightness when he felt anxious while having tremors a few days ago. He is concerned for covid. Denies sick contacts. He has not had sinus congestion, cough, denies feeling short of breath, sore throat. A: Low suspicion for viral infection. Physical exam was reassuring. Symptoms most likely due to alcohol withdrawal P: We talked about in clinic testing for COVID/Flu. Lab staff is unsure about what cost would be. Patient plans [facility] when he picks up medications later today.

## 2023-07-12 LAB — QUANTIFERON-TB GOLD PLUS
Mitogen-NIL: >10 [IU]/mL
NIL: 0.12 [IU]/mL
QuantiFERON-TB Gold Plus: NEGATIVE
TB1-NIL: 0.06 [IU]/mL
TB2-NIL: 0.01 [IU]/mL

## 2023-07-12 LAB — HIV-1 RNA QUANT-NO REFLEX-BLD
HIV 1 RNA Quant: 4800 {copies}/mL — ABNORMAL HIGH
HIV-1 RNA Quant, Log: 3.68 {Log_copies}/mL — ABNORMAL HIGH

## 2023-07-12 LAB — ACUTE HEP PANEL AND HEP B SURFACE AB
HEPATITIS C ANTIBODY REFILL$(REFL): NONREACTIVE
Hep A IgM: NONREACTIVE
Hep B C IgM: NONREACTIVE
Hepatitis B Surface Ag: NONREACTIVE

## 2023-07-12 LAB — REFLEX TIQ

## 2023-07-12 LAB — HEPATITIS B SURFACE ANTIBODY, QUANTITATIVE: Hep B S AB Quant (Post): <5 m[IU]/mL — ABNORMAL LOW (ref >=10)

## 2023-07-12 NOTE — Progress Notes (Signed)
Emailed Mitch to see if he could assist with patient/transportation. Waiting to hear back.

## 2023-07-12 NOTE — Progress Notes (Signed)
Internal Medicine Clinic Attending  Case discussed with the resident at the time of the visit.  We reviewed the resident's history and exam and pertinent patient test results.  I agree with the assessment, diagnosis, and plan of care documented in the resident's note.  

## 2023-07-13 ENCOUNTER — Telehealth: Payer: Self-pay

## 2023-07-13 NOTE — Telephone Encounter (Signed)
-----   Message from Raymondo Band sent at 07/12/2023  4:15 PM EST ----- Could you please Ask Marthann Schiller to work with this patient again. Doesn't appear he is taking his meds again   Please also ask him to come back to see [place] in 4 weeks for viral load and genotype testing. Thanks

## 2023-07-13 NOTE — Telephone Encounter (Signed)
Emailed Darryl Berg regarding patient, his response:   The recent appointment - Sheletha brought him - and they have "connected" quite well already - and so I think this transition will be better.  (He did not like my previous replacement - which is one part of the bad labs - and he was upset with Orvan Falconer retired)  On Friday Sheletha spent a bunch of time getting him back on his medication for his drinking - he is doing a Lithium taper - and back on his naltrexone - and when we talked to him yesterday - he reported he was feeling much better and focused.  He had a big relapse on the alcohol (his drug of choice) and when he drinks - he DRINKS.   He tends to not take his HIV meds when he is drinking (as you see)     As I said - he has really connected to [facility] - he told her a story about a drinking episode he has never told me - and I have known him over 20 years.  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     We can contact him today - (he sleeps in the morning -usually works late shift right now) and get an appointment for him to come in, to check again in a month.  He labs go up and down - based on how much he is drinking. He doesn't take meds when drinking as a rule.   If we can keep the Naltrexone consistent for a while (he stops and starts that too) he may make continued progress.      My big success of late with him - was to get him to agree to get a primary care provider.  (I also worry about his stress - with the promised crackdown on undocumented immigrants - his anxiety is going to be sky high I fear)     So - we will call him - schedule a follow up in a month - and you have lots of background now!     [employer]

## 2023-07-22 ENCOUNTER — Ambulatory Visit (INDEPENDENT_AMBULATORY_CARE_PROVIDER_SITE_OTHER): Payer: Self-pay | Admitting: Student

## 2023-07-22 VITALS — BP 128/88 | HR 88 | Temp 97.9°F | Ht 66.0 in | Wt 154.1 lb

## 2023-07-22 DIAGNOSIS — B079 Viral wart, unspecified: Secondary | ICD-10-CM | POA: Insufficient documentation

## 2023-07-22 DIAGNOSIS — G250 Essential tremor: Secondary | ICD-10-CM | POA: Insufficient documentation

## 2023-07-22 DIAGNOSIS — F1010 Alcohol abuse, uncomplicated: Secondary | ICD-10-CM

## 2023-07-22 DIAGNOSIS — M79605 Pain in left leg: Secondary | ICD-10-CM

## 2023-07-22 DIAGNOSIS — R202 Paresthesia of skin: Secondary | ICD-10-CM

## 2023-07-22 DIAGNOSIS — Z0000 Encounter for general adult medical examination without abnormal findings: Secondary | ICD-10-CM

## 2023-07-22 HISTORY — DX: Viral wart, unspecified: B07.9

## 2023-07-22 MED ORDER — PROPRANOLOL HCL 40 MG PO TABS: 40.0000 mg | ORAL_TABLET | Freq: Two times a day (BID) | ORAL | 11 refills | Status: DC

## 2023-07-22 MED ORDER — GABAPENTIN 300 MG PO CAPS: 600.0000 mg | ORAL_CAPSULE | Freq: Every day | ORAL | 11 refills | Status: AC

## 2023-07-22 NOTE — Assessment & Plan Note (Signed)
Describes bilateral left leg pain that starts on his lower back and radiates down his legs. Gabapentin has worked for him and he is currently needing to increase his dose to 600 BID. Worried he may run out of meds.    -Cw Gabapentin 600mg  BID

## 2023-07-22 NOTE — Assessment & Plan Note (Signed)
States that alcohol used to control his tremors. Has a FH of essential tremors. FTN abnormal today.   -Start Propanolol 40mg  BID  -monitor for lightheadedness or sleepiness

## 2023-07-22 NOTE — Assessment & Plan Note (Addendum)
Has been frozen in the past multiple times, at one point causing severe edema and blistering. Has history of HIV. Has had it for several years. Has tried compound W and OTC with no success. Consent was obtained to remove hyperkeratotic skin until the base and frozen with cryotherapy. Will need repeat treatment in 2 weeks. Was referred to Dermatology but pt is uninsured.  -RTC in 2 weeks  -Can apply compound W and duck tape nightly at home

## 2023-07-22 NOTE — Assessment & Plan Note (Signed)
Currently on Naltrexone 50mg  which has helped his cravings tremendously. He states he has no cravings and is very happy off of alcohol, going to work and being better with his meds  -cw same.

## 2023-07-22 NOTE — Assessment & Plan Note (Signed)
Patient is uninsured. Deferred colonoscopy until he can figure out his orange card.

## 2023-07-22 NOTE — Progress Notes (Signed)
CC:  Chief Complaint  Patient presents with   3 month follow up   Medication Refill    HPI:  [name] is a 47 y.o. male living with a history stated below and presents today for refills on his medications and follow up. Please see problem based assessment and plan for additional details.  Past Medical History:  Diagnosis Date   Depression    HIV disease (HCC)    Substance abuse (HCC)     Current Outpatient Medications on File Prior to Visit  Medication Sig Dispense Refill   albuterol (VENTOLIN HFA) 108 (90 Base) MCG/ACT inhaler Inhale 2 puffs into the lungs every 2 (two) hours as needed for wheezing or shortness of breath (cough). (Patient not taking: Reported on 07/08/2023) 8 g 0   amLODipine (NORVASC) 5 MG tablet Take 1 tablet (5 mg total) by mouth daily. 30 tablet 11   atorvastatin (LIPITOR) 20 MG tablet Take 1 tablet (20 mg total) by mouth daily. 30 tablet 11   bictegravir-emtricitabine-tenofovir AF (BIKTARVY) 50-200-25 MG TABS tablet TAKE 1 TABLET BY MOUTH DAILY 30 tablet 0   darunavir-cobicistat (PREZCOBIX) 800-150 MG tablet TAKE 1 TABLET BY MOUTH DAILY WITH BREAKFAST 30 tablet 0   folic acid (FOLVITE) 1 MG tablet Take 1 tablet (1 mg total) by mouth daily.     hydrochlorothiazide (HYDRODIURIL) 25 MG tablet Take 1 tablet (25 mg total) by mouth daily. 30 tablet 11   mupirocin ointment (BACTROBAN) 2 % Apply 1 Application topically 2 (two) times daily. 22 g 0   naltrexone (DEPADE) 50 MG tablet Take 1 tablet (50 mg total) by mouth daily. 30 tablet 2   omeprazole (PRILOSEC) 20 MG capsule Take 1 capsule (20 mg total) by mouth daily. 30 capsule 0   tadalafil (CIALIS) 20 MG tablet Take 1 tablet (20 mg total) by mouth daily as needed for erectile dysfunction. 10 tablet 5   thiamine (VITAMIN B-1) 50 MG tablet Take 1 tablet (50 mg total) by mouth daily.     vitamin B-12 (CYANOCOBALAMIN) 100 MCG tablet Take 1 tablet (100 mcg total) by mouth daily.     [DISCONTINUED] BIKTARVY  50-200-25 MG TABS tablet TAKE 1 TABLET BY MOUTH DAILY 30 tablet 5   [DISCONTINUED] diltiazem (CARDIZEM) 60 MG tablet Take 60 mg by mouth 2 (two) times daily.       [DISCONTINUED] PREZCOBIX 800-150 MG tablet TAKE 1 TABLET BY MOUTH DAILY WITH BREAKFAST 30 tablet 5   No current facility-administered medications on file prior to visit.    Family History  Problem Relation Age of Onset   Hypertension Father     Social History   Socioeconomic History   Marital status: Single    Spouse name: Not on file   Number of children: Not on file   Years of education: Not on file   Highest education level: Not on file  Occupational History   Not on file  Tobacco Use   Smoking status: Never   Smokeless tobacco: Never  Vaping Use   Vaping status: Never Used  Substance and Sexual Activity   Alcohol use: Not Currently    Alcohol/week: 12.0 standard drinks of alcohol    Types: 12 Standard drinks or equivalent per week    Comment: weekends   Drug use: No   Sexual activity: Yes    Partners: Male    Birth control/protection: Condom    Comment: pt given condoms latex free  Other Topics Concern   Not on  file  Social History Narrative   Not on file   Social Determinants of Health   Financial Resource Strain: Not on file  Food Insecurity: No Food Insecurity (10/13/2022)   Hunger Vital Sign    Worried About Running Out of Food in the Last Year: Never true    Ran Out of Food in the Last Year: Never true  Transportation Needs: Not on file  Physical Activity: Not on file  Stress: Not on file  Social Connections: Socially Isolated (10/13/2022)   Social Connection and Isolation Panel [NHANES]    Frequency of Communication with Friends and Family: More than three times a week    Frequency of Social Gatherings with Friends and Family: More than three times a week    Attends Religious Services: Never    Database administrator or Organizations: No    Attends Banker Meetings: Never     Marital Status: Separated  Intimate Partner Violence: Not At Risk (10/13/2022)   Humiliation, Afraid, Rape, and Kick questionnaire    Fear of Current or Ex-Partner: No    Emotionally Abused: No    Physically Abused: No    Sexually Abused: No    Review of Systems: ROS negative except for what is noted on the assessment and plan.  Vitals:   07/22/23 1058  BP: 128/88  Pulse: 88  Temp: 97.9 F (36.6 C)  TempSrc: Oral  SpO2: 99%  Weight: 154 lb 1.6 oz (69.9 kg)  Height: 5\' 6"  (1.676 m)    Physical Exam: Constitutional: well-appearing in NAD  HENT: normocephalic atraumatic, mucous membranes moist Eyes: conjunctiva non-erythematous Cardiovascular: regular rate and rhythm, no m/r/g Pulmonary/Chest: normal work of breathing on room air, lungs clear to auscultation bilaterally Abdominal: soft, non-tender, non-distended MSK: normal bulk and tone Neurological: alert & oriented x 3, no focal deficit, FTN is abnormal Skin: warm and dry, on the Left second finger there is a verrucous hyperkeratotic papule Psych: normal mood and behavior  Assessment & Plan:   Patient seen with Dr. Cleda Daub  ABUSE, ALCOHOL, CONTINUOUS Currently on Naltrexone 50mg  which has helped his cravings tremendously. He states he has no cravings and is very happy off of alcohol, going to work and being better with his meds  -cw same.   Left leg pain Describes bilateral left leg pain that starts on his lower back and radiates down his legs. Gabapentin has worked for him and he is currently needing to increase his dose to 600 BID. Worried he may run out of meds.    -Cw Gabapentin 600mg  BID  Wart of hand Has been frozen in the past multiple times, at one point causing severe edema and blistering. Has history of HIV. Has had it for several years. Has tried compound W and OTC with no success. Consent was obtained to remove hyperkeratotic skin until the base and frozen with cryotherapy. Will need repeat treatment  in 2 weeks. Was referred to Dermatology but pt is uninsured.  -RTC in 2 weeks  -Can apply compound W and duck tape nightly at home   Essential tremor States that alcohol used to control his tremors. Has a FH of essential tremors. FTN abnormal today.   -Start Propanolol 40mg  BID  -monitor for lightheadedness or sleepiness   Healthcare maintenance Patient is uninsured. Deferred colonoscopy until he can figure out his orange card.   Manuela Neptune, MD [facility] Internal Medicine, PGY-1 Phone: [phone] Date 07/22/2023 Time 3:42 PM

## 2023-07-22 NOTE — Patient Instructions (Signed)
Thank you, Mr.Colleen P Cush for allowing [place] to provide your care today.    I have ordered the following medication/changed the following medications:   Stop the following medications: Medications Discontinued During This Encounter  Medication Reason   gabapentin (NEURONTIN) 300 MG capsule Reorder     Start the following medications: Meds ordered this encounter  Medications   gabapentin (NEURONTIN) 300 MG capsule    Sig: Take 2 capsules (600 mg total) by mouth at bedtime.    Dispense:  60 capsule    Refill:  11   propranolol (INDERAL) 40 MG tablet    Sig: Take 1 tablet (40 mg total) by mouth 2 (two) times daily.    Dispense:  60 tablet    Refill:  11     Follow up:  2 weeks      Remember:   Apply compound W and duct tape daily at bedtime over your wart.   If the area that was frozen gets too inflamed you can put some vaseline and cover it with a bandaid.   If it gets very painful or infected please give [place] a call.   Should you have any questions or concerns please call the internal medicine clinic at [phone].     Manuela Neptune, MD [facility]

## 2023-07-27 NOTE — Progress Notes (Signed)
Internal Medicine Clinic Attending  I was physically present during the key portions of the resident provided service and participated in the medical decision making of patient's management care. I reviewed pertinent patient test results.  The assessment, diagnosis, and plan were formulated together and I agree with the documentation in the resident's note.  Gust Rung, DO

## 2023-08-04 ENCOUNTER — Other Ambulatory Visit: Payer: Self-pay

## 2023-08-04 DIAGNOSIS — B20 Human immunodeficiency virus [HIV] disease: Secondary | ICD-10-CM

## 2023-08-04 MED ORDER — PREZCOBIX 800-150 MG PO TABS: 1.0000 | ORAL_TABLET | Freq: Every day | ORAL | 5 refills | Status: AC

## 2023-08-04 MED ORDER — BIKTARVY 50-200-25 MG PO TABS: 1.0000 | ORAL_TABLET | Freq: Every day | ORAL | 5 refills | Status: AC

## 2023-08-11 ENCOUNTER — Ambulatory Visit: Payer: Self-pay | Admitting: Student

## 2023-08-11 VITALS — BP 132/80 | HR 86 | Temp 98.4°F | Ht 66.0 in | Wt 160.8 lb

## 2023-08-11 DIAGNOSIS — M19041 Primary osteoarthritis, right hand: Secondary | ICD-10-CM

## 2023-08-11 DIAGNOSIS — B079 Viral wart, unspecified: Secondary | ICD-10-CM

## 2023-08-11 DIAGNOSIS — M79605 Pain in left leg: Secondary | ICD-10-CM

## 2023-08-11 DIAGNOSIS — R7989 Other specified abnormal findings of blood chemistry: Secondary | ICD-10-CM

## 2023-08-11 DIAGNOSIS — Z0000 Encounter for general adult medical examination without abnormal findings: Secondary | ICD-10-CM

## 2023-08-11 HISTORY — DX: Other specified abnormal findings of blood chemistry: R79.89

## 2023-08-11 NOTE — Assessment & Plan Note (Signed)
He states that for the last two weeks has had a pulling sensation on his calf when he stands up. This will go away once he begins walking. Ibuprofen helps. He states these are not similar to the cramps he would get when he is dehydrated. The pulling sensation can be severe and last a few seconds. No fevers, no masses, legs look symmetrical bilaterally.  Gabapentin does not help. I think this is musculoskeletal related.  -Tylenol for pain

## 2023-08-11 NOTE — Assessment & Plan Note (Addendum)
Describes pain on the right hand that is dull that happens intermittently throughout the day. He notices it most when he is sleeping, although sometimes at work. He types a lot. Negative Phalen's test. No edema or erythema on joints.   Finds relief with tylenol.  -Cw tylenol

## 2023-08-11 NOTE — Assessment & Plan Note (Signed)
Coming in for repeat freezing of his wart. Pt understands that he will need several freeze thaw cycles to treat it, and that sometimes even this treatment may be unsuccessful. Wart was peared until pinpoint bleeding at the base was obtained and frozen with cryotherapy. Pt aware of risks of the procedure, including bleeding, blistering, edema, erythema. Instructed to take Tylenol if it becomes painful.   Patient has not covered the wart with duct tape or used compound W. Pt will obtain these at the store and given them a try. Was given paperwork to apply for financial aid so he can see dermatology.   -RTC in 2 weeks  -Apply compound W and duck tape nightly at home  -Tylenol for pain

## 2023-08-11 NOTE — Assessment & Plan Note (Signed)
Patient given financial assistance paperwork as he is due for a colonoscopy. Patient will fill this out and bring at next visit.

## 2023-08-11 NOTE — Patient Instructions (Signed)
Thank you, Darryl Berg for allowing [place] to provide your care today.   Follow up:  2 weeks     Remember:   To bring the financial assistance paperwork.   For the pain in your hands and calf. You mentioned using two of the 650mg  tablets. If you do this, do not use more than twice a day.   For your wart:   Some blistering and redness after cryotherapy is normal   Tylenol can help with the pain   Please monitor for purulent drainage, excessive pain.    Should you have any questions or concerns please call the internal medicine clinic at [phone].     Manuela Neptune, MD [facility]

## 2023-08-11 NOTE — Progress Notes (Signed)
CC:  Chief Complaint  Patient presents with   Leg Pain    Left calf pain 8/10 everyday for 1.5 wks    Warts    left hand    HPI:  [name] is a 47 y.o. male living with a history stated below and presents today for FU on his wart. Please see problem based assessment and plan for additional details.  Past Medical History:  Diagnosis Date   Depression    HIV disease (HCC)    Substance abuse (HCC)     Current Outpatient Medications on File Prior to Visit  Medication Sig Dispense Refill   albuterol (VENTOLIN HFA) 108 (90 Base) MCG/ACT inhaler Inhale 2 puffs into the lungs every 2 (two) hours as needed for wheezing or shortness of breath (cough). (Patient not taking: Reported on 07/08/2023) 8 g 0   amLODipine (NORVASC) 5 MG tablet Take 1 tablet (5 mg total) by mouth daily. 30 tablet 11   atorvastatin (LIPITOR) 20 MG tablet Take 1 tablet (20 mg total) by mouth daily. 30 tablet 11   bictegravir-emtricitabine-tenofovir AF (BIKTARVY) 50-200-25 MG TABS tablet Take 1 tablet by mouth daily. 30 tablet 5   darunavir-cobicistat (PREZCOBIX) 800-150 MG tablet Take 1 tablet by mouth daily with breakfast. 30 tablet 5   folic acid (FOLVITE) 1 MG tablet Take 1 tablet (1 mg total) by mouth daily.     gabapentin (NEURONTIN) 300 MG capsule Take 2 capsules (600 mg total) by mouth at bedtime. 60 capsule 11   hydrochlorothiazide (HYDRODIURIL) 25 MG tablet Take 1 tablet (25 mg total) by mouth daily. 30 tablet 11   mupirocin ointment (BACTROBAN) 2 % Apply 1 Application topically 2 (two) times daily. 22 g 0   naltrexone (DEPADE) 50 MG tablet Take 1 tablet (50 mg total) by mouth daily. 30 tablet 2   omeprazole (PRILOSEC) 20 MG capsule Take 1 capsule (20 mg total) by mouth daily. 30 capsule 0   propranolol (INDERAL) 40 MG tablet Take 1 tablet (40 mg total) by mouth 2 (two) times daily. 60 tablet 11   tadalafil (CIALIS) 20 MG tablet Take 1 tablet (20 mg total) by mouth daily as needed for erectile  dysfunction. 10 tablet 5   thiamine (VITAMIN B-1) 50 MG tablet Take 1 tablet (50 mg total) by mouth daily.     vitamin B-12 (CYANOCOBALAMIN) 100 MCG tablet Take 1 tablet (100 mcg total) by mouth daily.     [DISCONTINUED] BIKTARVY 50-200-25 MG TABS tablet TAKE 1 TABLET BY MOUTH DAILY 30 tablet 5   [DISCONTINUED] diltiazem (CARDIZEM) 60 MG tablet Take 60 mg by mouth 2 (two) times daily.       [DISCONTINUED] PREZCOBIX 800-150 MG tablet TAKE 1 TABLET BY MOUTH DAILY WITH BREAKFAST 30 tablet 5   No current facility-administered medications on file prior to visit.    Family History  Problem Relation Age of Onset   Hypertension Father     Social History   Socioeconomic History   Marital status: Single    Spouse name: Not on file   Number of children: Not on file   Years of education: Not on file   Highest education level: Not on file  Occupational History   Not on file  Tobacco Use   Smoking status: Never   Smokeless tobacco: Never  Vaping Use   Vaping status: Never Used  Substance and Sexual Activity   Alcohol use: Not Currently    Alcohol/week: 12.0 standard drinks of alcohol  Types: 12 Standard drinks or equivalent per week    Comment: weekends   Drug use: No   Sexual activity: Yes    Partners: Male    Birth control/protection: Condom    Comment: pt given condoms latex free  Other Topics Concern   Not on file  Social History Narrative   Not on file   Social Drivers of Health   Financial Resource Strain: Not on file  Food Insecurity: No Food Insecurity (10/13/2022)   Hunger Vital Sign    Worried About Running Out of Food in the Last Year: Never true    Ran Out of Food in the Last Year: Never true  Transportation Needs: Not on file  Physical Activity: Not on file  Stress: Not on file  Social Connections: Socially Isolated (10/13/2022)   Social Connection and Isolation Panel [NHANES]    Frequency of Communication with Friends and Family: More than three times a week     Frequency of Social Gatherings with Friends and Family: More than three times a week    Attends Religious Services: Never    Database administrator or Organizations: No    Attends Banker Meetings: Never    Marital Status: Separated  Intimate Partner Violence: Not At Risk (10/13/2022)   Humiliation, Afraid, Rape, and Kick questionnaire    Fear of Current or Ex-Partner: No    Emotionally Abused: No    Physically Abused: No    Sexually Abused: No    Review of Systems: ROS negative except for what is noted on the assessment and plan.  Vitals:   08/11/23 0905  BP: 132/80  Pulse: 86  Temp: 98.4 F (36.9 C)  TempSrc: Oral  Weight: 160 lb 12.8 oz (72.9 kg)  Height: 5\' 6"  (1.676 m)    Physical Exam: Constitutional: well-appearing , in no acute distress HENT: normocephalic atraumatic, mucous membranes moist Eyes: conjunctiva non-erythematous Cardiovascular: regular rate and rhythm, no m/r/g Pulmonary/Chest: normal work of breathing on room air, lungs clear to auscultation bilaterally Abdominal: soft, non-tender, non-distended MSK: normal bulk and tone Neurological: alert & oriented x 3, no focal deficit Skin: warm and dry, over the left anterior fourth finger, there is a 4mm exophytic hyperkeratotic mass Psych: normal mood and behavior  Assessment & Plan:    Patient seen with Dr. Hanley Ben of hand Coming in for repeat freezing of his wart. Pt understands that he will need several freeze thaw cycles to treat it, and that sometimes even this treatment may be unsuccessful. Wart was peared until pinpoint bleeding at the base was obtained and frozen with cryotherapy. Pt aware of risks of the procedure, including bleeding, blistering, edema, erythema. Instructed to take Tylenol if it becomes painful.   Patient has not covered the wart with duct tape or used compound W. Pt will obtain these at the store and given them a try. Was given paperwork to apply for financial  aid so he can see dermatology.   -RTC in 2 weeks  -Apply compound W and duck tape nightly at home  -Tylenol for pain   Musculoskeletal pain of lower extremity, left He states that for the last two weeks has had a pulling sensation on his calf when he stands up. This will go away once he begins walking. Ibuprofen helps. He states these are not similar to the cramps he would get when he is dehydrated. The pulling sensation can be severe and last a few seconds. No fevers, no masses,  legs look symmetrical bilaterally.  Gabapentin does not help. I think this is musculoskeletal related.  -Tylenol for pain  Healthcare maintenance Patient given financial assistance paperwork as he is due for a colonoscopy. Patient will fill this out and bring at next visit.   Arthritis of right hand Describes pain on the right hand that is dull that happens intermittently throughout the day. He notices it most when he is sleeping, although sometimes at work. He types a lot. Negative Phalen's test. No edema or erythema on joints.   Finds relief with tylenol.  -Cw tylenol   Elevated LFTs Has elevated LFTs, could be secondary to his prior alcohol use. May benefit from imaging in the future to further characterize.   Manuela Neptune, MD [facility] Internal Medicine, PGY-1 Phone: [phone] Date 08/11/2023 Time 2:46 PM

## 2023-08-11 NOTE — Assessment & Plan Note (Signed)
Has elevated LFTs, could be secondary to his prior alcohol use. May benefit from imaging in the future to further characterize.

## 2023-08-15 NOTE — Progress Notes (Signed)
Internal Medicine Clinic Attending  I saw and evaluated the patient.  I personally confirmed the key portions of the history and exam documented by Dr. Hassan Rowan and I reviewed pertinent patient test results.  The assessment, diagnosis, and plan were formulated together and I agree with the documentation in the resident's note.

## 2023-08-26 ENCOUNTER — Encounter: Payer: Self-pay | Admitting: Student

## 2023-08-26 NOTE — Progress Notes (Deleted)
Established Patient Office Visit  Subjective   Patient ID: Darryl Berg, male    DOB: 1975-09-18  Age: 47 y.o. MRN: [id number]  No chief complaint on file.   HPI This is a 47 year old male living with a history stated below and presents today for wart on his hand. Please see problem based assessment and plan for additional details.   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   Human immunodeficiency virus (HIV) disease (HCC) 10/17/2006   ABUSE, ALCOHOL, CONTINUOUS 10/17/2006   Depression 10/17/2006   Past Medical History:  Diagnosis Date   Depression    HIV disease (HCC)    Substance abuse (HCC)    Past Surgical History:  Procedure Laterality Date   NASAL HEMORRHAGE CONTROL  11/17/2011   Procedure: EPISTAXIS CONTROL;  Surgeon: Darletta Moll, MD;  [facility]: [facility];  Service: ENT;  Laterality: Bilateral;   Social History   Tobacco Use   Smoking status: Never   Smokeless tobacco: Never  Vaping Use   Vaping status: Never Used  Substance Use Topics   Alcohol use: Not Currently    Alcohol/week: 12.0 standard drinks of alcohol    Types: 12 Standard drinks or equivalent per week    Comment: weekends   Drug use: No   Social History   Socioeconomic History   Marital status: Single    Spouse name: Not on file   Number of children: Not on file   Years of education: Not on file   Highest education level: Not on  file  Occupational History   Not on file  Tobacco Use   Smoking status: Never   Smokeless tobacco: Never  Vaping Use   Vaping status: Never Used  Substance and Sexual Activity   Alcohol use: Not Currently    Alcohol/week: 12.0 standard drinks of alcohol    Types: 12 Standard drinks or equivalent per week    Comment: weekends   Drug use: No   Sexual activity: Yes    Partners: Male    Birth control/protection: Condom    Comment: pt given condoms latex free  Other Topics Concern   Not on file  Social History Narrative   Not on file   Social Drivers of Health   Financial Resource Strain: Not on file  Food Insecurity: No Food Insecurity (10/13/2022)   Hunger Vital Sign    Worried About Running Out of Food in the Last Year: Never true    Ran Out of Food in the Last Year: Never true  Transportation Needs: Not on file  Physical Activity: Not on file  Stress: Not on file  Social Connections: Socially Isolated (10/13/2022)   Social Connection and Isolation Panel [NHANES]    Frequency of Communication with Friends and Family: More than three times a week    Frequency of Social Gatherings with Friends and Family: More than three times a week    Attends Religious Services: Never  Active Member of Clubs or Organizations: No    Attends Banker Meetings: Never    Marital Status: Separated  Intimate Partner Violence: Not At Risk (10/13/2022)   Humiliation, Afraid, Rape, and Kick questionnaire    Fear of Current or Ex-Partner: No    Emotionally Abused: No    Physically Abused: No    Sexually Abused: No   Family Status  Relation Name Status   Brother  Deceased   Father  (Not Specified)  No partnership data on file   Family History  Problem Relation Age of Onset   Hypertension Father    No Known Allergies    ROS    Objective:     There were no vitals taken for this visit. BP Readings from Last 3 Encounters:  08/11/23 132/80  07/22/23 128/88  07/08/23  134/85   Wt Readings from Last 3 Encounters:  08/11/23 160 lb 12.8 oz (72.9 kg)  07/22/23 154 lb 1.6 oz (69.9 kg)  07/08/23 145 lb 12.8 oz (66.1 kg)   SpO2 Readings from Last 3 Encounters:  07/22/23 99%  07/08/23 98%  07/08/23 98%      Physical Exam   No results found for any visits on 08/26/23.  Last CBC Lab Results  Component Value Date   WBC 3.1 (L) 04/21/2023   HGB 13.3 04/21/2023   HCT 39.2 04/21/2023   MCV 99.5 04/21/2023   MCH 33.8 (H) 04/21/2023   RDW 14.0 04/21/2023   PLT 191 04/21/2023   Last metabolic panel Lab Results  Component Value Date   GLUCOSE 91 04/21/2023   NA 140 04/21/2023   K 4.0 04/21/2023   CL 104 04/21/2023   CO2 30 04/21/2023   BUN 6 (L) 04/21/2023   CREATININE 0.88 04/21/2023   EGFR 107 04/21/2023   CALCIUM 8.6 04/21/2023   PROT 6.9 04/21/2023   ALBUMIN 4.1 07/10/2022   BILITOT 0.6 04/21/2023   ALKPHOS 45 07/10/2022   AST 69 (H) 04/21/2023   ALT 49 (H) 04/21/2023   ANIONGAP 20 (H) 07/10/2022   Last lipids Lab Results  Component Value Date   CHOL 220 (H) 11/02/2022   HDL 95 11/02/2022   LDLCALC 106 (H) 11/02/2022   TRIG 97 11/02/2022   CHOLHDL 2.3 11/02/2022    The 30-QMVH ASCVD risk score (Arnett DK, et al., 2019) is: 7.2%    Assessment & Plan:  Wart of hand   Problem List Items Addressed This Visit   None   No follow-ups on file.    Jeral Pinch, DO

## 2023-09-15 ENCOUNTER — Institutional Professional Consult (permissible substitution): Payer: Self-pay | Admitting: Licensed Clinical Social Worker

## 2023-09-16 ENCOUNTER — Encounter: Payer: Self-pay | Admitting: Student

## 2023-09-22 ENCOUNTER — Ambulatory Visit: Payer: Self-pay | Admitting: Internal Medicine

## 2023-09-22 ENCOUNTER — Institutional Professional Consult (permissible substitution): Payer: Self-pay | Admitting: Licensed Clinical Social Worker

## 2023-09-27 ENCOUNTER — Ambulatory Visit: Payer: Self-pay | Admitting: Internal Medicine

## 2023-09-30 ENCOUNTER — Ambulatory Visit (INDEPENDENT_AMBULATORY_CARE_PROVIDER_SITE_OTHER): Payer: Self-pay | Admitting: Student

## 2023-09-30 ENCOUNTER — Encounter: Payer: Self-pay | Admitting: Student

## 2023-09-30 VITALS — BP 128/82 | HR 55 | Temp 97.8°F | Ht 66.0 in | Wt 160.2 lb

## 2023-09-30 DIAGNOSIS — B079 Viral wart, unspecified: Secondary | ICD-10-CM

## 2023-09-30 DIAGNOSIS — M79605 Pain in left leg: Secondary | ICD-10-CM

## 2023-09-30 DIAGNOSIS — I10 Essential (primary) hypertension: Secondary | ICD-10-CM

## 2023-09-30 MED ORDER — DICLOFENAC SODIUM 1 % EX GEL: 2.0000 g | Freq: Four times a day (QID) | CUTANEOUS | 2 refills | Status: AC

## 2023-09-30 MED ORDER — AMLODIPINE BESYLATE 5 MG PO TABS: 5.0000 mg | ORAL_TABLET | Freq: Every day | ORAL | 11 refills | Status: DC

## 2023-09-30 NOTE — Progress Notes (Signed)
CC: Wart of left hand  HPI:  Darryl Berg is a 48 y.o. male with a past medical history of hypertension, warts who presents for follow-up appointment on his warts.  Please see assessment and plan for full HPI.  Medications: Hypertension: Amlodipine 5 mg daily, hydrochlorothiazide 25 mg daily Hyperlipidemia: Atorvastatin 20 mg daily HIV: Biktarvy 1 tablet daily, Prezcobrex Neuropathy: Gabapentin 600 mg nightly Alcohol use: Folic acid 1 mg daily, naltrexone 50 mg daily, vitamin B12 100 mcg daily, thiamine 50 mg daily GERD: Omeprazole 20 mg daily ED: Tadalafil 20 mg as needed  Past Medical History:  Diagnosis Date   Depression    HIV disease (HCC)    Substance abuse (HCC)      Current Outpatient Medications:    diclofenac Sodium (VOLTAREN) 1 % GEL, Apply 2 g topically 4 (four) times daily., Disp: 50 g, Rfl: 2   albuterol (VENTOLIN HFA) 108 (90 Base) MCG/ACT inhaler, Inhale 2 puffs into the lungs every 2 (two) hours as needed for wheezing or shortness of breath (cough). (Patient not taking: Reported on 07/08/2023), Disp: 8 g, Rfl: 0   amLODipine (NORVASC) 5 MG tablet, Take 1 tablet (5 mg total) by mouth daily., Disp: 30 tablet, Rfl: 11   atorvastatin (LIPITOR) 20 MG tablet, Take 1 tablet (20 mg total) by mouth daily., Disp: 30 tablet, Rfl: 11   bictegravir-emtricitabine-tenofovir AF (BIKTARVY) 50-200-25 MG TABS tablet, Take 1 tablet by mouth daily., Disp: 30 tablet, Rfl: 5   darunavir-cobicistat (PREZCOBIX) 800-150 MG tablet, Take 1 tablet by mouth daily with breakfast., Disp: 30 tablet, Rfl: 5   folic acid (FOLVITE) 1 MG tablet, Take 1 tablet (1 mg total) by mouth daily., Disp: , Rfl:    gabapentin (NEURONTIN) 300 MG capsule, Take 2 capsules (600 mg total) by mouth at bedtime., Disp: 60 capsule, Rfl: 11   mupirocin ointment (BACTROBAN) 2 %, Apply 1 Application topically 2 (two) times daily., Disp: 22 g, Rfl: 0   naltrexone (DEPADE) 50 MG tablet, Take 1 tablet (50 mg total) by  mouth daily., Disp: 30 tablet, Rfl: 2   omeprazole (PRILOSEC) 20 MG capsule, Take 1 capsule (20 mg total) by mouth daily., Disp: 30 capsule, Rfl: 0   propranolol (INDERAL) 40 MG tablet, Take 1 tablet (40 mg total) by mouth 2 (two) times daily., Disp: 60 tablet, Rfl: 11   tadalafil (CIALIS) 20 MG tablet, Take 1 tablet (20 mg total) by mouth daily as needed for erectile dysfunction., Disp: 10 tablet, Rfl: 5   thiamine (VITAMIN B-1) 50 MG tablet, Take 1 tablet (50 mg total) by mouth daily., Disp: , Rfl:    vitamin B-12 (CYANOCOBALAMIN) 100 MCG tablet, Take 1 tablet (100 mcg total) by mouth daily., Disp: , Rfl:   Review of Systems:    Negative except for what is stated in HPI  Physical Exam:  Vitals:   09/30/23 1037  BP: 128/82  Pulse: (!) 55  Temp: 97.8 F (36.6 C)  TempSrc: Oral  SpO2: 100%  Weight: 160 lb 3.2 oz (72.7 kg)  Height: 5\' 6"  (1.676 m)    General: Patient is sitting comfortably in the room  Cardio: Regular rate and rhythm, no murmurs, rubs or gallops Pulmonary: Clear to ausculation bilaterally with no rales, rhonchi, and crackles  Extremities: Wart noted to proximal portion of left middle finger on the palmar aspect with no drainage, erythema noted.  Left lower extremity with some tenderness noted at proximal hip.  Full range of motion with some  pain appreciated.     Assessment & Plan:   Essential hypertension Patient has a past medical history of hypertension.  Blood pressure today 128/82.  This is at goal.  However, patient states he has not taken his medications today.  Likely I do think that he is doing well without medications.  Will discontinue one today.  Plan: -Refill amlodipine 5 mg daily -Discontinue hydrochlorothiazide 25 mg daily  Wart of hand Patient reports that he has had his wart on his hand for a year now.  He states that has been slowly improving.  He is here for cryotherapy today.  Cryotherapy performed today.  He tolerated well.  Please see  physical exam section for wart.  Plan: -Continue to follow -Once patient gets insurance, can refer to dermatology  Musculoskeletal pain of lower extremity, left Patient reports chronic left lower extremity pain.  He describes to be in his hip.  Described to be an aching pain.  He thinks it is neuropathy.  He denies any falls, he denies any traumas.  He denies it to be a burning sensation.  He reports it is only occurs at night when he is laying on it.  He denies any exacerbation of the pain with walking.  He has not tried physical therapy.  He thinks he needs gabapentin, but I told him that he does not need gabapentin for this as it is not a neuropathy type pain.  On my exam, patient has full range of motion of his left lower extremity in all ranges of motion.  He does have some tenderness noted at possibly the greater trochanter.  However, bursitis likely is more acute compared to chronic type pain.  Is likely musculoskeletal in nature.  Do not think this is acute muscle spasm, claudication, fracture, sprain, avascular necrosis or neuropathy.  Will treat conservatively with physical therapy.  Plan: -Back exercises provided -Voltaren gel provided -Patient can continue Tylenol for pain  Patient discussed with Dr.  Mercie Eon  Modena Slater, DO PGY-2 Internal Medicine Resident  Pager: [phone]

## 2023-09-30 NOTE — Assessment & Plan Note (Signed)
Patient reports that he has had his wart on his hand for a year now.  He states that has been slowly improving.  He is here for cryotherapy today.  Cryotherapy performed today.  He tolerated well.  Please see physical exam section for wart.  Plan: -Continue to follow -Once patient gets insurance, can refer to dermatology

## 2023-09-30 NOTE — Assessment & Plan Note (Signed)
Patient has a past medical history of hypertension.  Blood pressure today 128/82.  This is at goal.  However, patient states he has not taken his medications today.  Likely I do think that he is doing well without medications.  Will discontinue one today.  Plan: -Refill amlodipine 5 mg daily -Discontinue hydrochlorothiazide 25 mg daily

## 2023-09-30 NOTE — Assessment & Plan Note (Addendum)
Patient reports chronic left lower extremity pain.  He describes to be in his hip.  Described to be an aching pain.  He thinks it is neuropathy.  He denies any falls, he denies any traumas.  He denies it to be a burning sensation.  He reports it is only occurs at night when he is laying on it.  He denies any exacerbation of the pain with walking.  He has not tried physical therapy.  He thinks he needs gabapentin, but I told him that he does not need gabapentin for this as it is not a neuropathy type pain.  On my exam, patient has full range of motion of his left lower extremity in all ranges of motion.  He does have some tenderness noted at possibly the greater trochanter.  However, bursitis likely is more acute compared to chronic type pain.  Is likely musculoskeletal in nature.  Do not think this is acute muscle spasm, claudication, fracture, sprain, avascular necrosis or neuropathy.  Will treat conservatively with physical therapy.  Plan: -Back exercises provided -Voltaren gel provided -Patient can continue Tylenol for pain

## 2023-09-30 NOTE — Patient Instructions (Addendum)
Castle, Lamons you for allowing me to take part in your care today.  Here are your instructions.  1.  I have refilled your amlodipine.  Stop taking your hydrochlorothiazide.  2.  I froze your wart.  3.  Come back in 1 month.  Once you get insurance, we can refer you to dermatology.  4.  I am sending you for something called Voltaren gel.  Please take this daily, this is topical, please put it on your hip.  Thank you, Dr. Allena Katz  If you have any other questions please contact the internal medicine clinic at [phone] If it is after hours, please call the [facility] at [phone] and then ask the person who picks up for the resident on call.

## 2023-10-03 NOTE — Progress Notes (Signed)
Internal Medicine Clinic Attending  I was physically present during the key portions of the resident provided service and participated in the medical decision making of patient's management care. I reviewed pertinent patient test results.  The assessment, diagnosis, and plan were formulated together and I agree with the documentation in the resident's note.  Mercie Eon, MD

## 2023-10-07 ENCOUNTER — Ambulatory Visit (INDEPENDENT_AMBULATORY_CARE_PROVIDER_SITE_OTHER): Payer: Self-pay | Admitting: Internal Medicine

## 2023-10-07 ENCOUNTER — Other Ambulatory Visit: Payer: Self-pay

## 2023-10-07 ENCOUNTER — Encounter: Payer: Self-pay | Admitting: Internal Medicine

## 2023-10-07 VITALS — BP 114/75 | HR 77 | Temp 98.7°F | Wt 163.4 lb

## 2023-10-07 DIAGNOSIS — Z1159 Encounter for screening for other viral diseases: Secondary | ICD-10-CM

## 2023-10-07 DIAGNOSIS — B20 Human immunodeficiency virus [HIV] disease: Secondary | ICD-10-CM

## 2023-10-07 DIAGNOSIS — Z23 Encounter for immunization: Secondary | ICD-10-CM

## 2023-10-07 DIAGNOSIS — N529 Male erectile dysfunction, unspecified: Secondary | ICD-10-CM

## 2023-10-07 NOTE — Addendum Note (Signed)
 Addended by: Mariena Meares M on: 10/07/2023 11:57 AM   Modules accepted: Orders
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
## 2023-10-07 NOTE — Patient Instructions (Signed)
 Continue biktarvy /prezcobix    Vaccine today: Prevnar 20 (pneumonia) 2nd shot hep b (heplisav)   Blood test    F/u with me 6 months

## 2023-10-11 LAB — COMPLETE METABOLIC PANEL WITH GFR
AG Ratio: 1.5 (calc) (ref 1.0–2.5)
ALT: 14 U/L (ref 9–46)
AST: 16 U/L (ref 10–40)
Albumin: 4.3 g/dL (ref 3.6–5.1)
Alkaline phosphatase (APISO): 46 U/L (ref 36–130)
BUN: 12 mg/dL (ref 7–25)
CO2: 27 mmol/L (ref 20–32)
Calcium: 9.5 mg/dL (ref 8.6–10.3)
Chloride: 108 mmol/L (ref 98–110)
Creat: 1.1 mg/dL (ref 0.60–1.29)
Globulin: 2.9 g/dL (ref 1.9–3.7)
Glucose, Bld: 87 mg/dL (ref 65–99)
Potassium: 4 mmol/L (ref 3.5–5.3)
Sodium: 144 mmol/L (ref 135–146)
Total Bilirubin: 0.4 mg/dL (ref 0.2–1.2)
Total Protein: 7.2 g/dL (ref 6.1–8.1)
eGFR: 83 mL/min/{1.73_m2} (ref >=60)

## 2023-10-11 LAB — CBC
HCT: 39.5 % (ref 38.5–50.0)
Hemoglobin: 13.2 g/dL (ref 13.2–17.1)
MCH: 31.7 pg (ref 27.0–33.0)
MCHC: 33.4 g/dL (ref 32.0–36.0)
MCV: 94.7 fL (ref 80.0–100.0)
MPV: 10.2 fL (ref 7.5–12.5)
Platelets: 274 10*3/uL (ref 140–400)
RBC: 4.17 10*6/uL — ABNORMAL LOW (ref 4.20–5.80)
RDW: 13.5 % (ref 11.0–15.0)
WBC: 4.1 10*3/uL (ref 3.8–10.8)

## 2023-10-11 LAB — HIV-1 RNA QUANT-NO REFLEX-BLD
HIV 1 RNA Quant: 80 {copies}/mL — ABNORMAL HIGH
HIV-1 RNA Quant, Log: 1.9 {Log} — ABNORMAL HIGH

## 2023-10-11 LAB — HEPATITIS B SURFACE ANTIBODY, QUANTITATIVE: Hep B S AB Quant (Post): 77 m[IU]/mL (ref >=10)

## 2023-11-14 ENCOUNTER — Ambulatory Visit: Payer: Self-pay | Admitting: Student

## 2023-11-14 NOTE — Telephone Encounter (Signed)
  Chief Complaint: Wart Symptoms: wart Frequency: ongoing, returned approx 1 month ago Pertinent Negatives: Patient denies fever, redness. Disposition: [] ED /[] Urgent Care (no appt availability in office) / [x] Appointment(In office/virtual)/ []  [facility]/ [] Home Care/ [] Refused Recommended Disposition /[] [facility]/ []  Follow-up with PCP Additional Notes: Patient calls reporting returning wart on L hand. Patient states it has been treated several times but continues to return. Patient is requesting OV to discuss dermatology consult. Patient is also requesting refill on librium, reports he went to a party and began drinking approx two weeks ago. Per protocol, patient to be seen within two weeks. Patient warm transferred to CAL, Mae, to assist with scheduling.    Copied from CRM [id number]. Topic: Clinical - Red Word Triage >> Nov 14, 2023  2:00 PM Everette Rank wrote: Red Word that prompted transfer to Nurse Triage: Worts on LT hand iratated has  2-3 day(had been seeing for this before/Shaking issue needs medication Reason for Disposition  [1] Skin growth or mole AND [2] sticks up out of the skin (elevated) AND [3] feels rough to the touch  Answer Assessment - Initial Assessment Questions 1. APPEARANCE of LESION: "What does it look like?"      Left hand, warts 2. SIZE: "How big is it?" (e.g., inches, cm; or compare to size of pinhead, tip of pen, eraser, coin, pea, grape, ping pong ball)      Pin tip 3. COLOR: "What color is it?" "Is there more than one color?"     Brown 4. SHAPE: "What shape is it?" (e.g., round, irregular)     Round 5. RAISED: "Does it stick up above the skin or is it flat?" (e.g., raised or elevated)     Raised above skin 6. TENDER: "Does it hurt when you touch it?"  (Scale 1-10; or mild, moderate, severe)     Sometimes, 7/10 7. LOCATION: "Where is it located?"      Left hand 8. ONSET: "When did it first appear?"      Chronic, ongoing, returned one  month ago 9. NUMBER: "Is there just one?" or "Are there others?"     One 10. CAUSE: "What do you think it is?"       Wart 11. OTHER SYMPTOMS: "Do you have any other symptoms?" (e.g., fever)       Denies  Protocols used: Skin Lesion - Moles or Growths-A-AH

## 2023-11-18 ENCOUNTER — Encounter: Payer: Self-pay | Admitting: Student

## 2023-11-18 ENCOUNTER — Ambulatory Visit: Payer: Self-pay | Admitting: Student

## 2023-11-18 VITALS — BP 146/111 | HR 96 | Temp 98.1°F | Ht 66.0 in | Wt 148.0 lb

## 2023-11-18 DIAGNOSIS — F10930 Alcohol use, unspecified with withdrawal, uncomplicated: Secondary | ICD-10-CM

## 2023-11-18 DIAGNOSIS — B079 Viral wart, unspecified: Secondary | ICD-10-CM

## 2023-11-18 DIAGNOSIS — F1010 Alcohol abuse, uncomplicated: Secondary | ICD-10-CM

## 2023-11-18 DIAGNOSIS — G250 Essential tremor: Secondary | ICD-10-CM

## 2023-11-18 DIAGNOSIS — B078 Other viral warts: Secondary | ICD-10-CM

## 2023-11-18 MED ORDER — PRIMIDONE 50 MG PO TABS: 50.0000 mg | ORAL_TABLET | Freq: Every day | ORAL | 3 refills | Status: DC

## 2023-11-18 MED ORDER — CHLORDIAZEPOXIDE HCL 25 MG PO CAPS: ORAL_CAPSULE | ORAL | 0 refills | Status: AC

## 2023-11-18 MED ORDER — ACAMPROSATE CALCIUM 333 MG PO TBEC: 666.0000 mg | DELAYED_RELEASE_TABLET | Freq: Three times a day (TID) | ORAL | 3 refills | Status: DC

## 2023-11-18 MED ORDER — CHLORDIAZEPOXIDE HCL 25 MG PO CAPS: ORAL_CAPSULE | ORAL | 0 refills | Status: DC

## 2023-11-18 NOTE — Progress Notes (Signed)
   CC: help stopping alcohol  HPI:  [name] is a 48 y.o. male with a PMH stated below who presents today for assistance with alcohol cessation.  Please see problem based assessment and plan for additional details.  Past Medical History:  Diagnosis Date   Depression    HIV disease (HCC)    Substance abuse (HCC)     Review of Systems: ROS negative except for what is noted on the assessment and plan.  Vitals:   11/18/23 0919 11/18/23 0935  BP: (!) 158/110 (!) 146/111  Pulse: (!) 107 96  Temp: 98.1 F (36.7 C)   TempSrc: Oral   SpO2: 100%   Weight: 148 lb (67.1 kg)   Height: 5\' 6"  (1.676 m)     Physical Exam: Constitutional: well-appearing man well groomed and in no acute distress HENT: normocephalic atraumatic, mucous membranes moist Eyes: conjunctiva non-erythematous Cardiovascular: regular rate and rhythm, no m/r/g Pulmonary/Chest: normal work of breathing on room air, lungs clear to auscultation bilaterally Abdominal: soft, non-tender, non-distended MSK: normal bulk and tone Neurological: alert & oriented x 3, no focal deficit Skin: warm and dry. Wart on L middle finder palmar aspect. Psych: normal mood and behavior  Assessment & Plan:   Patient discussed with Dr. Cleda Daub  ABUSE, ALCOHOL, CONTINUOUS [facility] Librium taper to help him stop alcohol.  He has done this in the clinic twice before and was also treated with naltrexone.  Unfortunately his return to drinking H time.  At present he drinks about 5 beers a day.  He very adamantly would like to quit.  He does not have a history of withdrawal seizures and no recent hospitalizations. - Start Librium taper 50 4 times daily for 1 day then 50 3 times daily then 50 twice daily then 50 daily for 4 days total - Thereafter start acamprosate 667 mg 3 times daily.  Unfortunately is without insurance and may not be able to for this.  He tried naltrexone in the past and did not help him.  Essential  tremor Essential tremor impacts visibility.  Alcohol as the alcohol understandably helps.  He tried propranolol 40 twice daily in the past and said made him feel much too sleepy.  He is not interested in restarting or trying reduced dose. - Start primidone 50 daily.  It is likely that his medicine will need to be increased in the future.  Wart of hand He has a wart on the palmar aspect of his right middle finger.  He was previously treated with cryotherapy although not resolved.  He does not want to do cryotherapy again.  He would like to see a dermatologist.  He does not have insurance but is willing to pay out-of-pocket. - Will refer to dermatology  RTC in 2 months to evaluate alcohol cessation, tremor, HTN, health maintenance.  Katheran James, D.O. [facility] Internal Medicine, PGY-1 Phone: [phone] Date 11/18/2023 Time 11:49 AM

## 2023-11-18 NOTE — Assessment & Plan Note (Signed)
 Essential tremor impacts visibility.  Alcohol as the alcohol understandably helps.  He tried propranolol 40 twice daily in the past and said made him feel much too sleepy.  He is not interested in restarting or trying reduced dose. - Start primidone 50 daily.  It is likely that his medicine will need to be increased in the future.

## 2023-11-18 NOTE — Assessment & Plan Note (Signed)
 [facility] Librium taper to help him stop alcohol.  He has done this in the clinic twice before and was also treated with naltrexone.  Unfortunately his return to drinking H time.  At present he drinks about 5 beers a day.  He very adamantly would like to quit.  He does not have a history of withdrawal seizures and no recent hospitalizations. - Start Librium taper 50 4 times daily for 1 day then 50 3 times daily then 50 twice daily then 50 daily for 4 days total - Thereafter start acamprosate 667 mg 3 times daily.  Unfortunately is without insurance and may not be able to for this.  He tried naltrexone in the past and did not help him.

## 2023-11-18 NOTE — Assessment & Plan Note (Signed)
 He has a wart on the palmar aspect of his right middle finger.  He was previously treated with cryotherapy although not resolved.  He does not want to do cryotherapy again.  He would like to see a dermatologist.  He does not have insurance but is willing to pay out-of-pocket. - Will refer to dermatology

## 2023-11-18 NOTE — Patient Instructions (Signed)
 Start librium taper today On final day of taper, start acamprosate (to reduce craving) and primidone (to reduce tremor) Please return in 2 months.

## 2023-11-27 NOTE — Progress Notes (Signed)
 Internal Medicine Clinic Attending  Case discussed with the resident at the time of the visit.  We reviewed the resident's history and exam and pertinent patient test results.  I agree with the assessment, diagnosis, and plan of care documented in the resident's note.

## 2024-01-27 NOTE — Progress Notes (Signed)
 The 10-year ASCVD risk score (Arnett DK, et al., 2019) is: 8.6%   Values used to calculate the score:     Age: 48 years     Sex: Male     Is Non-Hispanic African American: Yes     Diabetic: No     Tobacco smoker: No     Systolic Blood Pressure: 146 mmHg     Is BP treated: Yes     HDL Cholesterol: 95 mg/dL     Total Cholesterol: 220 mg/dL  Currently prescribed atorvastatin  20 mg.  Keighley Deckman, BSN, RN

## 2024-02-01 ENCOUNTER — Telehealth: Payer: Self-pay | Admitting: Student

## 2024-02-01 ENCOUNTER — Other Ambulatory Visit: Payer: Self-pay

## 2024-02-01 ENCOUNTER — Ambulatory Visit: Payer: Self-pay

## 2024-02-01 NOTE — Telephone Encounter (Signed)
 Called patient to schedule Dermatology appointment, LVM to call back. If patient calls back, PLEASE schedule for 02/09/2024 with Dr. Leocadia Rains.   Any questions please see me.  Appointment Notes: wart on the palmar aspect of his right middle finger (Ref by [facility]). SW   Thanks!

## 2024-02-03 ENCOUNTER — Ambulatory Visit: Payer: Self-pay | Admitting: Student

## 2024-02-03 ENCOUNTER — Encounter: Payer: Self-pay | Admitting: Student

## 2024-02-11 ENCOUNTER — Encounter: Payer: Self-pay | Admitting: *Deleted

## 2024-02-25 ENCOUNTER — Other Ambulatory Visit: Payer: Self-pay | Admitting: Student

## 2024-02-25 DIAGNOSIS — G250 Essential tremor: Secondary | ICD-10-CM

## 2024-02-27 NOTE — Telephone Encounter (Signed)
 Medication sent to pharmacy

## 2024-03-13 ENCOUNTER — Telehealth: Payer: Self-pay | Admitting: *Deleted

## 2024-03-13 NOTE — Telephone Encounter (Signed)
 Copied from CRM [id number]. Topic: General - Other >> Mar 13, 2024  3:54 PM Miquel SAILOR wrote: Reason for CRM: Patient request to talk to office only. Called and transferred

## 2024-03-13 NOTE — Telephone Encounter (Signed)
 Called pt - stated he had already talked to someone about derm referral.

## 2024-03-15 ENCOUNTER — Ambulatory Visit (INDEPENDENT_AMBULATORY_CARE_PROVIDER_SITE_OTHER): Payer: Self-pay

## 2024-03-15 VITALS — BP 131/82 | HR 85 | Wt 163.6 lb

## 2024-03-15 DIAGNOSIS — B079 Viral wart, unspecified: Secondary | ICD-10-CM

## 2024-03-15 NOTE — Progress Notes (Signed)
    SUBJECTIVE:   CHIEF COMPLAINT / HPI:   Rash Chronicity: wart on the palmar aspect of the base of the left third finger present for two years. The problem has been gradually worsening since onset. The rash is characterized by pain and swelling. He was exposed to nothing. Treatments tried: compound w and cryotherapy x6 times and bandage. The treatment provided no relief.     PERTINENT  PMH / PSH: none  OBJECTIVE:   BP 131/82   Pulse 85   Wt 163 lb 9.6 oz (74.2 kg)   SpO2 100%   BMI 26.41 kg/m    Verrucous lesion at the base of the left third digit. No surrounding swelling or erythema.   ASSESSMENT/PLAN:   Assessment & Plan Wart of hand Patient has failed topical treatment and multiple sessions of cryotherapy. Elects for excision of lesion. Will follow up with us  tomorrow at 1:30 for removal.        Darryl Berg Alena Morrison, MD [facility]

## 2024-03-15 NOTE — Assessment & Plan Note (Signed)
 Patient has failed topical treatment and multiple sessions of cryotherapy. Elects for excision of lesion. Will follow up with us  tomorrow at 1:30 for removal.

## 2024-03-15 NOTE — Patient Instructions (Addendum)
 Please come back to see us  tomorrow, 03/16/24, at 1:30.  Dr. Alena [facility]

## 2024-03-16 ENCOUNTER — Ambulatory Visit (INDEPENDENT_AMBULATORY_CARE_PROVIDER_SITE_OTHER): Payer: Self-pay

## 2024-03-16 VITALS — BP 143/94 | HR 93 | Wt 163.5 lb

## 2024-03-16 DIAGNOSIS — B078 Other viral warts: Secondary | ICD-10-CM

## 2024-03-16 NOTE — Progress Notes (Signed)
    SUBJECTIVE:   CHIEF COMPLAINT / HPI:   Patient following up from yesterday for wart removal. No additional questions.  OBJECTIVE:   BP (!) 143/94   Pulse 93   Wt 163 lb 8 oz (74.2 kg)   BMI 26.39 kg/m   Physical Exam Constitutional:      Appearance: Normal appearance.  Skin:    Comments: A 1-2 cm verrucous lesion at the base of the left middle finger.       ASSESSMENT/PLAN:   Assessment & Plan Palmar wart Counseling provided. Excision and cauterization discussed. Verbal and written consent obtained.   Excision of Benign Skin Lesion Procedure Note  PRE-OP DIAGNOSIS: palmar wart POST-OP DIAGNOSIS: Same  PROCEDURE: skin lesion excision Performing Physician: Dr. Alena Supervising Physician (if applicable): Dr. Anders  PROCEDURE:  Excision and cauterization  The area surrounding the skin lesion was prepared and draped in the  usual sterile manner. Numbed with 5 cc of lidocaine  with epinephrine . Scalpel excision of the lesion was performed followed by electrocautery of the area. Hemostasis was assured. The hand was dressed with bacitracin  and guaze wrap.  Closure:      None  Followup: The patient tolerated the procedure well without  complications.  Standard post-procedure care is explained and return  precautions are given.   Zylen Wenig Alena Morrison, MD [facility]

## 2024-03-16 NOTE — Patient Instructions (Signed)
 It was wonderful to see you today.  Today we removed your wart. You can take Tylenol  1000 mg every six hours if you need it for pain and Ibuprofen  800 mg every 8 hours if you need it for pain.  We removed and burned the wart. The skin should peel off over the next couple of days. Do not get it wet for the next two days while your skin heals. After the area has healed, you can wash it and dry it like normal. If you notice redness, warmth, and swelling around the area where the wart was removed over the next couple of days please call back to see us .   Thank you for choosing [facility].   Please call [phone] with any questions about today's appointment.    Darryl Berg Darryl Morrison, MD  Family Medicine

## 2024-03-19 ENCOUNTER — Telehealth: Payer: Self-pay | Admitting: Family Medicine

## 2024-03-19 NOTE — Telephone Encounter (Signed)
 HIPAA compliant callback message left.   When he calls. Please help him schedule derm clinic follow up appointment in about 2-3 weeks to reexamine his hand post procedure.

## 2024-03-26 ENCOUNTER — Telehealth: Payer: Self-pay

## 2024-03-26 NOTE — Telephone Encounter (Signed)
 Called to schedule follow up from Dermatology clinic. If patient calls back please assist in scheduling.   Thanks!

## 2024-04-06 ENCOUNTER — Ambulatory Visit: Payer: Self-pay | Admitting: Internal Medicine

## 2024-05-01 ENCOUNTER — Ambulatory Visit: Payer: Self-pay | Admitting: Student

## 2024-05-04 ENCOUNTER — Ambulatory Visit (INDEPENDENT_AMBULATORY_CARE_PROVIDER_SITE_OTHER): Payer: Self-pay | Admitting: Student

## 2024-05-04 ENCOUNTER — Other Ambulatory Visit: Payer: Self-pay

## 2024-05-04 ENCOUNTER — Encounter: Payer: Self-pay | Admitting: Student

## 2024-05-04 VITALS — BP 130/85 | HR 116 | Temp 98.6°F | Ht 66.0 in | Wt 165.2 lb

## 2024-05-04 DIAGNOSIS — F10930 Alcohol use, unspecified with withdrawal, uncomplicated: Secondary | ICD-10-CM

## 2024-05-04 DIAGNOSIS — R202 Paresthesia of skin: Secondary | ICD-10-CM

## 2024-05-04 DIAGNOSIS — F10139 Alcohol abuse with withdrawal, unspecified: Secondary | ICD-10-CM

## 2024-05-04 DIAGNOSIS — F1010 Alcohol abuse, uncomplicated: Secondary | ICD-10-CM

## 2024-05-04 MED ORDER — VITAMIN B-1 50 MG PO TABS: 50.0000 mg | ORAL_TABLET | Freq: Every day | ORAL | Status: AC

## 2024-05-04 MED ORDER — FOLIC ACID 1 MG PO TABS: 1.0000 mg | ORAL_TABLET | Freq: Every day | ORAL | Status: AC

## 2024-05-04 MED ORDER — ACAMPROSATE CALCIUM 333 MG PO TBEC: 666.0000 mg | DELAYED_RELEASE_TABLET | Freq: Three times a day (TID) | ORAL | 3 refills | Status: DC

## 2024-05-04 MED ORDER — CHLORDIAZEPOXIDE HCL 25 MG PO CAPS: ORAL_CAPSULE | ORAL | 0 refills | Status: DC

## 2024-05-04 NOTE — Assessment & Plan Note (Addendum)
 He presents today requesting assistance with alcohol cessation and withdrawal symptoms.  I saw him for similar issue back in March of this year.  At that time I treated him with a Librium  taper and acamprosate .  Withdrawal is anxiety and tremors but no history of seizures.  After treatment earlier this year he maintained alcohol abstinence for about 5 months but has since returned to drinking liquor due to the influence is in his friend groups.  Now he is drinking liquor multiple times throughout the day and it is starting to impact his performance at work.   - Start Librium  taper 50 4 times daily for 1 day then 50 3 times daily then 50 twice daily then 50 daily for 4 days total  - Intended to thereafter treat with acamprosate  67 mg 3 times daily but was contacted by his pharmacy and they do not have this medicine.  I called other pharmacies in the area and they do not have either.  Apparently it might be being discontinued.  I will attempt to call the patient to consider alternatives such as naltrexone  but in the past he tried and did not help. - vitamins - Rec AA or similar community groups

## 2024-05-04 NOTE — Progress Notes (Signed)
   CC: Help stopping alcohol  HPI:  [name] is a 48 y.o. male with a PMH stated below who presents today for help stopping alcohol.  Please see problem based assessment and plan for additional details.  Past Medical History:  Diagnosis Date   Depression    HIV disease (HCC)    Substance abuse (HCC)     Review of Systems: ROS negative except for what is noted on the assessment and plan.  Vitals:   05/04/24 0956 05/04/24 0959  BP: 130/85   Pulse: (!) 126 (!) 116  Temp: 98.6 F (37 C)   TempSrc: Oral   SpO2: 100%   Weight: 165 lb 3.2 oz (74.9 kg)   Height: 5' 6 (1.676 m)     Physical Exam: Constitutional: well-appearing man in no acute distress HENT: normocephalic atraumatic, mucous membranes moist Cardiovascular: regular rate and rhythm, no m/r/g Pulmonary/Chest: normal work of breathing on room air, lungs clear to auscultation bilaterally Abdominal: soft, non-tender, non-distended MSK: normal bulk and tone Neurological: alert & oriented x 3, no focal deficit Skin: warm and dry Psych: normal mood and behavior  Assessment & Plan:   Patient discussed with Dr. Trudy  ABUSE, ALCOHOL, CONTINUOUS He presents today requesting assistance with alcohol cessation and withdrawal symptoms.  I saw him for similar issue back in March of this year.  At that time I treated him with a Librium  taper and acamprosate .  Withdrawal is anxiety and tremors but no history of seizures.  After treatment earlier this year he maintained alcohol abstinence for about 5 months but has since returned to drinking liquor due to the influence is in his friend groups.  Now he is drinking liquor multiple times throughout the day and it is starting to impact his performance at work.   - Start Librium  taper 50 4 times daily for 1 day then 50 3 times daily then 50 twice daily then 50 daily for 4 days total  - Intended to thereafter treat with acamprosate  67 mg 3 times daily but was contacted by  his pharmacy and they do not have this medicine.  I called other pharmacies in the area and they do not have either.  Apparently it might be being discontinued.  I will attempt to call the patient to consider alternatives such as naltrexone  but in the past he tried and did not help. - vitamins - Rec AA or similar community groups  RTC in 2 months follow up alcohol cessation  Lonni Africa, D.O. [facility] Internal Medicine, PGY-2 Phone: [phone] Date 05/04/2024 Time 2:03 PM

## 2024-05-04 NOTE — Patient Instructions (Addendum)
 Start librium  for 7 days according to the written instructions with the medicine. Then start acamprosate  three times daily Please consider group support for long term alcohol abstinence Please call Dr. Chapman office for a follow up appointment, he wants to see you again this fall Please return in 6 weeks for a checkup

## 2024-05-09 ENCOUNTER — Encounter: Payer: Self-pay | Admitting: Student

## 2024-05-14 NOTE — Progress Notes (Signed)
 Internal Medicine Clinic Attending  Case discussed with the resident at the time of the visit.  We reviewed the resident's history and exam and pertinent patient test results.  I agree with the assessment, diagnosis, and plan of care documented in the resident's note.

## 2024-05-16 ENCOUNTER — Ambulatory Visit: Payer: Self-pay | Admitting: Internal Medicine

## 2024-06-15 ENCOUNTER — Ambulatory Visit: Payer: Self-pay

## 2024-06-29 ENCOUNTER — Ambulatory Visit: Payer: Self-pay | Admitting: Student

## 2024-06-29 VITALS — BP 133/87 | HR 63 | Temp 97.6°F | Ht 66.0 in | Wt 170.5 lb

## 2024-06-29 DIAGNOSIS — Z1211 Encounter for screening for malignant neoplasm of colon: Secondary | ICD-10-CM

## 2024-06-29 DIAGNOSIS — F1010 Alcohol abuse, uncomplicated: Secondary | ICD-10-CM

## 2024-06-29 DIAGNOSIS — I10 Essential (primary) hypertension: Secondary | ICD-10-CM

## 2024-06-29 DIAGNOSIS — Z79899 Other long term (current) drug therapy: Secondary | ICD-10-CM

## 2024-06-29 DIAGNOSIS — B20 Human immunodeficiency virus [HIV] disease: Secondary | ICD-10-CM

## 2024-06-29 DIAGNOSIS — E785 Hyperlipidemia, unspecified: Secondary | ICD-10-CM

## 2024-06-29 MED ORDER — ATORVASTATIN CALCIUM 20 MG PO TABS: 20.0000 mg | ORAL_TABLET | Freq: Every day | ORAL | 11 refills | Status: DC

## 2024-06-29 MED ORDER — AMLODIPINE BESYLATE 5 MG PO TABS: 5.0000 mg | ORAL_TABLET | Freq: Every day | ORAL | 11 refills | Status: DC

## 2024-06-29 NOTE — Progress Notes (Signed)
   CC: Alcohol use follow up  HPI:  [name] is a 48 y.o. male with a PMH stated below who presents today for follow up and general check.  Please see problem based assessment and plan for additional details.  Past Medical History:  Diagnosis Date   Depression    HIV disease (HCC)    Substance abuse (HCC)     Review of Systems: ROS negative except for what is noted on the assessment and plan.  Vitals:   06/29/24 1054 06/29/24 1119  BP: (!) 143/92 133/87  Pulse: 65 63  Temp: 97.6 F (36.4 C)   TempSrc: Oral   SpO2: 100%   Weight: 170 lb 8 oz (77.3 kg)   Height: 5' 6 (1.676 m)     Physical Exam: Constitutional: well-appearing man in no acute distress Cardiovascular: regular rate and rhythm, no m/r/g Pulmonary/Chest: normal work of breathing on room air, lungs clear to auscultation bilaterally Neurological: alert & oriented x 3, no focal deficit Skin: warm and dry Psych: normal mood and behavior  Assessment & Plan:   Patient discussed with Dr. CHARLENA Eastern  ABUSE, ALCOHOL, CONTINUOUS 58-month follow-up of alcohol use disorder.  When I saw him last, he reports some friction at work regarding his drinking.  Works night shift.  At times was inebriated at work.  I prescribed a Librium  taper to facilitate cessation and acamprosate  to maintain it.  Unfortunately [place] [employer] [place] out of [place] in this area and likely will be for some time.  In any case, he is now doing much better.  Number friction at work.  Not currently involved in community support groups. - Stable but continue to monitor - Continue vitamins - Continue to recommend AA or other similar community groups  Essential hypertension Blood pressure 133/87.  Somewhat high but marginal.  He thinks his amlodipine  causes a bit of drowsiness and wanted to consider changing back to hydrochlorothiazide  (was also on 25mg  dose in past), but we opted against it given the monitoring needs for HCTZ. - Continue amlodipine  5  daily  Hyperlipidemia Cholesterol over 200 when last checked last year.  HIV with treatment of Biktarvy  may influence this.  Will recheck lipids. - Continue atorvastatin  20 daily  Advised he contact ID for Biktarvy  refills and recheck HIV Order placed for a screening colonoscopy. No reported family history.  Lonni Africa, D.O. [facility] Internal Medicine, PGY-2 Phone: [phone] Date 06/29/2024 Time 11:37 AM

## 2024-06-29 NOTE — Progress Notes (Signed)
 Internal Medicine Clinic Attending  Case discussed with the resident at the time of the visit.  We reviewed the resident's history and exam and pertinent patient test results.  I agree with the assessment, diagnosis, and plan of care documented in the resident's note.

## 2024-06-29 NOTE — Assessment & Plan Note (Signed)
 Blood pressure 133/87.  Somewhat high but marginal.  He thinks his amlodipine  causes a bit of drowsiness and wanted to consider changing back to hydrochlorothiazide  (was also on 25mg  dose in past), but we opted against it given the monitoring needs for HCTZ. - Continue amlodipine  5 daily

## 2024-06-29 NOTE — Patient Instructions (Signed)
 It was a pleasure to see you today.  I will call you if any concerns on your bloodwork.  Expect a phone call to set up your colonoscopy.  Please schedule appointment with ID and re [facility] a refill on you Biktarvy .  Please return for general checkup in 6 months.

## 2024-06-29 NOTE — Assessment & Plan Note (Signed)
 95-month follow-up of alcohol use disorder.  When I saw him last, he reports some friction at work regarding his drinking.  Works night shift.  At times was inebriated at work.  I prescribed a Librium  taper to facilitate cessation and acamprosate  to maintain it.  Unfortunately [employer] is out of stock in this area and likely will be for some time.  In any case, he is now doing much better.  Number friction at work.  Not currently involved in community support groups. - Stable but continue to monitor - Continue vitamins - Continue to recommend AA or other similar community groups

## 2024-06-29 NOTE — Assessment & Plan Note (Signed)
 Cholesterol over 200 when last checked last year.  HIV with treatment of Biktarvy  may influence this.  Will recheck lipids. - Continue atorvastatin  20 daily

## 2024-06-30 LAB — LIPID PANEL
Chol/HDL Ratio: 4.1 ratio (ref 0.0–5.0)
Cholesterol, Total: 170 mg/dL (ref 100–199)
HDL: 41 mg/dL (ref >39)
LDL Chol Calc (NIH): 104 mg/dL — ABNORMAL HIGH (ref 0–99)
Triglycerides: 141 mg/dL (ref 0–149)
VLDL Cholesterol Cal: 25 mg/dL (ref 5–40)

## 2024-07-04 ENCOUNTER — Other Ambulatory Visit: Payer: Self-pay

## 2024-07-04 DIAGNOSIS — Z79899 Other long term (current) drug therapy: Secondary | ICD-10-CM

## 2024-07-04 DIAGNOSIS — B20 Human immunodeficiency virus [HIV] disease: Secondary | ICD-10-CM

## 2024-07-04 DIAGNOSIS — Z113 Encounter for screening for infections with a predominantly sexual mode of transmission: Secondary | ICD-10-CM

## 2024-07-06 ENCOUNTER — Other Ambulatory Visit: Payer: Self-pay

## 2024-07-06 DIAGNOSIS — B20 Human immunodeficiency virus [HIV] disease: Secondary | ICD-10-CM

## 2024-07-06 DIAGNOSIS — Z113 Encounter for screening for infections with a predominantly sexual mode of transmission: Secondary | ICD-10-CM

## 2024-07-07 LAB — C. TRACHOMATIS/N. GONORRHOEAE RNA
C. trachomatis RNA, TMA: NOT DETECTED
N. gonorrhoeae RNA, TMA: NOT DETECTED

## 2024-07-08 LAB — COMPLETE METABOLIC PANEL WITHOUT GFR
AG Ratio: 1.4 (calc) (ref 1.0–2.5)
ALT: 26 U/L (ref 9–46)
AST: 23 U/L (ref 10–40)
Albumin: 4.1 g/dL (ref 3.6–5.1)
Alkaline phosphatase (APISO): 54 U/L (ref 36–130)
BUN: 8 mg/dL (ref 7–25)
CO2: 29 mmol/L (ref 20–32)
Calcium: 9.1 mg/dL (ref 8.6–10.3)
Chloride: 102 mmol/L (ref 98–110)
Creat: 0.93 mg/dL (ref 0.60–1.29)
Globulin: 3 g/dL (ref 1.9–3.7)
Glucose, Bld: 108 mg/dL — ABNORMAL HIGH (ref 65–99)
Potassium: 4 mmol/L (ref 3.5–5.3)
Sodium: 139 mmol/L (ref 135–146)
Total Bilirubin: 0.3 mg/dL (ref 0.2–1.2)
Total Protein: 7.1 g/dL (ref 6.1–8.1)

## 2024-07-08 LAB — CBC WITH DIFFERENTIAL/PLATELET
Absolute Lymphocytes: 2808 {cells}/uL (ref 850–3900)
Absolute Monocytes: 369 {cells}/uL (ref 200–950)
Basophils Absolute: 41 {cells}/uL (ref 0–200)
Basophils Relative: 0.9 %
Eosinophils Absolute: 140 {cells}/uL (ref 15–500)
Eosinophils Relative: 3.1 %
HCT: 42.3 % (ref 38.5–50.0)
Hemoglobin: 14 g/dL (ref 13.2–17.1)
MCH: 31 pg (ref 27.0–33.0)
MCHC: 33.1 g/dL (ref 32.0–36.0)
MCV: 93.8 fL (ref 80.0–100.0)
MPV: 9.9 fL (ref 7.5–12.5)
Monocytes Relative: 8.2 %
Neutro Abs: 1143 {cells}/uL — ABNORMAL LOW (ref 1500–7800)
Neutrophils Relative %: 25.4 %
Platelets: 235 Thousand/uL (ref 140–400)
RBC: 4.51 Million/uL (ref 4.20–5.80)
RDW: 14.2 % (ref 11.0–15.0)
Total Lymphocyte: 62.4 %
WBC: 4.5 Thousand/uL (ref 3.8–10.8)

## 2024-07-08 LAB — HIV-1 RNA QUANT-NO REFLEX-BLD
HIV 1 RNA Quant: 58 {copies}/mL — ABNORMAL HIGH
HIV-1 RNA Quant, Log: 1.76 {Log_copies}/mL — ABNORMAL HIGH

## 2024-07-08 LAB — T-HELPER CELLS (CD4) COUNT (NOT AT ARMC)
Absolute CD4: 682 {cells}/uL (ref 490–1740)
CD4 T Helper %: 26 % — ABNORMAL LOW (ref 30–61)
Total lymphocyte count: 2659 {cells}/uL (ref 850–3900)

## 2024-07-08 LAB — RPR: RPR Ser Ql: NONREACTIVE

## 2024-07-12 ENCOUNTER — Ambulatory Visit: Payer: Self-pay | Admitting: Internal Medicine

## 2024-07-12 ENCOUNTER — Encounter: Payer: Self-pay | Admitting: Internal Medicine

## 2024-07-12 ENCOUNTER — Other Ambulatory Visit: Payer: Self-pay

## 2024-07-12 VITALS — BP 153/91 | HR 80 | Temp 97.5°F | Resp 16 | Wt 174.4 lb

## 2024-07-12 DIAGNOSIS — B20 Human immunodeficiency virus [HIV] disease: Secondary | ICD-10-CM

## 2024-07-12 DIAGNOSIS — Z113 Encounter for screening for infections with a predominantly sexual mode of transmission: Secondary | ICD-10-CM

## 2024-07-12 DIAGNOSIS — E785 Hyperlipidemia, unspecified: Secondary | ICD-10-CM

## 2024-07-12 MED ORDER — TADALAFIL 20 MG PO TABS: 20.0000 mg | ORAL_TABLET | Freq: Every day | ORAL | 5 refills | Status: AC | PRN

## 2024-07-12 NOTE — Patient Instructions (Signed)
 You are doing well  Your level was undetectable before, so make sure no missed dose of your hiv medication   See me in 6 months and we can do blood tests the same visit (as I need to check your hep b immunity as well)   Refill tadalafil 
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
## 2024-08-02 ENCOUNTER — Ambulatory Visit: Payer: Self-pay | Admitting: Student

## 2024-08-02 ENCOUNTER — Ambulatory Visit: Payer: Self-pay

## 2024-08-03 ENCOUNTER — Ambulatory Visit: Payer: Self-pay | Admitting: Student

## 2024-08-03 ENCOUNTER — Encounter: Payer: Self-pay | Admitting: Student

## 2024-08-03 VITALS — BP 143/92 | HR 100 | Temp 98.0°F | Ht 66.0 in | Wt 172.6 lb

## 2024-08-03 DIAGNOSIS — I10 Essential (primary) hypertension: Secondary | ICD-10-CM

## 2024-08-03 DIAGNOSIS — B20 Human immunodeficiency virus [HIV] disease: Secondary | ICD-10-CM

## 2024-08-03 DIAGNOSIS — R202 Paresthesia of skin: Secondary | ICD-10-CM

## 2024-08-03 DIAGNOSIS — E785 Hyperlipidemia, unspecified: Secondary | ICD-10-CM

## 2024-08-03 DIAGNOSIS — F1010 Alcohol abuse, uncomplicated: Secondary | ICD-10-CM

## 2024-08-03 MED ORDER — OMEPRAZOLE 20 MG PO CPDR: 20.0000 mg | DELAYED_RELEASE_CAPSULE | Freq: Every day | ORAL | 3 refills | Status: AC

## 2024-08-03 MED ORDER — ACAMPROSATE CALCIUM 333 MG PO TBEC: 666.0000 mg | DELAYED_RELEASE_TABLET | Freq: Three times a day (TID) | ORAL | 3 refills | Status: AC

## 2024-08-03 MED ORDER — HYDROCHLOROTHIAZIDE 12.5 MG PO TABS: 12.5000 mg | ORAL_TABLET | Freq: Every day | ORAL | 0 refills | Status: AC

## 2024-08-03 MED ORDER — ATORVASTATIN CALCIUM 10 MG PO TABS: 10.0000 mg | ORAL_TABLET | Freq: Every day | ORAL | 0 refills | Status: AC

## 2024-08-03 NOTE — Progress Notes (Unsigned)
 CC: Follow-up  HPI:  Darryl Berg is a 48 y.o. male living with a history stated below and presents today for follow-up. Please see problem based assessment and plan for additional details.  Past Medical History:  Diagnosis Date   Depression    Elevated LFTs 08/11/2023   HIV disease (HCC)    Macrocytic anemia 09/07/2022   Musculoskeletal pain of lower extremity, left 09/07/2022   S/P cryotherapy of skin lesion 09/15/2022   Substance abuse (HCC)    Wart of hand 07/22/2023    Current Outpatient Medications on File Prior to Visit  Medication Sig Dispense Refill   bictegravir-emtricitabine-tenofovir  AF (BIKTARVY ) 50-200-25 MG TABS tablet Take 1 tablet by mouth daily. 30 tablet 5   darunavir -cobicistat  (PREZCOBIX ) 800-150 MG tablet Take 1 tablet by mouth daily with breakfast. 30 tablet 5   folic acid  (FOLVITE ) 1 MG tablet Take 1 tablet (1 mg total) by mouth daily.     gabapentin  (NEURONTIN ) 300 MG capsule Take 2 capsules (600 mg total) by mouth at bedtime. 60 capsule 11   mupirocin  ointment (BACTROBAN ) 2 % Apply 1 Application topically 2 (two) times daily. 22 g 0   primidone  (MYSOLINE ) 50 MG tablet TAKE 1 TABLET(50 MG) BY MOUTH DAILY 90 tablet 1   tadalafil  (CIALIS ) 20 MG tablet Take 1 tablet (20 mg total) by mouth daily as needed for erectile dysfunction. 10 tablet 5   thiamine  (VITAMIN B-1) 50 MG tablet Take 1 tablet (50 mg total) by mouth daily.     vitamin B-12 (CYANOCOBALAMIN) 100 MCG tablet Take 1 tablet (100 mcg total) by mouth daily.     [DISCONTINUED] BIKTARVY  50-200-25 MG TABS tablet TAKE 1 TABLET BY MOUTH DAILY 30 tablet 5   [DISCONTINUED] diltiazem (CARDIZEM) 60 MG tablet Take 60 mg by mouth 2 (two) times daily.       [DISCONTINUED] PREZCOBIX  800-150 MG tablet TAKE 1 TABLET BY MOUTH DAILY WITH BREAKFAST 30 tablet 5   No current facility-administered medications on file prior to visit.    Family History  Problem Relation Age of Onset   Hypertension Father      Social History   Socioeconomic History   Marital status: Single    Spouse name: Not on file   Number of children: Not on file   Years of education: Not on file   Highest education level: Not on file  Occupational History   Not on file  Tobacco Use   Smoking status: Never   Smokeless tobacco: Never  Vaping Use   Vaping status: Never Used  Substance and Sexual Activity   Alcohol use: Not Currently    Alcohol/week: 12.0 standard drinks of alcohol    Types: 12 Standard drinks or equivalent per week    Comment: weekends   Drug use: No   Sexual activity: Yes    Partners: Male    Birth control/protection: Condom    Comment: pt given condoms latex free  Other Topics Concern   Not on file  Social History Narrative   Not on file   Social Drivers of Health   Financial Resource Strain: Not on file  Food Insecurity: No Food Insecurity (05/04/2024)   Hunger Vital Sign    Worried About Running Out of Food in the Last Year: Never true    Ran Out of Food in the Last Year: Never true  Transportation Needs: No Transportation Needs (03/16/2024)   PRAPARE - Administrator, Civil Service (Medical): No  Lack of Transportation (Non-Medical): No  Physical Activity: Not on file  Stress: Not on file  Social Connections: Moderately Integrated (05/04/2024)   Social Connection and Isolation Panel    Frequency of Communication with Friends and Family: More than three times a week    Frequency of Social Gatherings with Friends and Family: Twice a week    Attends Religious Services: More than 4 times per year    Active Member of [employer] or Organizations: No    Attends Banker Meetings: Never    Marital Status: Married  Catering Manager Violence: Not At Risk (05/04/2024)   Humiliation, Afraid, Rape, and Kick questionnaire    Fear of Current or Ex-Partner: No    Emotionally Abused: No    Physically Abused: No    Sexually Abused: No    Review of Systems: ROS negative  except for what is noted on the assessment and plan.  Vitals:   08/03/24 0822 08/03/24 0831  BP: (!) 148/91 (!) 143/92  Pulse: (!) 103 100  Temp: 98 F (36.7 C)   TempSrc: Oral   SpO2: 96%   Weight: 172 lb 9.6 oz (78.3 kg)   Height: 5' 6 (1.676 m)     Physical Exam: Constitutional: well-appearing, sitting in chair, in no acute distress Cardiovascular: regular rate and rhythm, no m/r/g Pulmonary/Chest: normal work of breathing on room air, lungs clear to auscultation bilaterally Abdominal: soft, non-tender, non-distended MSK: normal bulk and tone Skin: warm and dry Psych: normal mood and behavior  Assessment & Plan:  Patient discussed with Dr. Karna  Human immunodeficiency virus (HIV) disease (HCC) Follows with ID and last seen 11/13. Continue biktarvy /prezcobix .  Essential hypertension BP is 148/91 and 143/92. Does not check BP at home. Prescribed Norvasc  5 mg daily but has not been taking this as it makes him drowsy. Will start hydrochlorothiazide  12.5 mg daily as he has tolerated this well in this past. Will obtain BP cuff and return in one month for CMP.   ABUSE, ALCOHOL, CONTINUOUS States he had a few drinks over Thanksgiving but other than that has not had a drink in 5 months. This is not reflected on 10/31 note where there was active drinking and inebriation at work per his report. He states that is incorrect. Repeatedly asks for Librium  for cravings. I counseled him extensively on how that is not  a good regimen. He was insisted on getting Librium . I stated that we can try Acamprosate  as he does not think Naltrexone  was helpful in the past. Start 666 mg TID. Follow-up next month.  Hyperlipidemia Prescribed Lipitor but has not been taking this. LDL 10/31 was 104 with goal < 100. PER ID, Lipitor preferable as less interaction with HIV meds. Will refill Lipitor today and check CMP next month.   Norman Lobstein, D.O. [facility], PGY-2 Date 08/05/2024 Time  3:19 PM

## 2024-08-03 NOTE — Assessment & Plan Note (Signed)
 Follows with ID and last seen 11/13. Continue biktarvy /prezcobix .

## 2024-08-05 NOTE — Assessment & Plan Note (Signed)
 BP is 148/91 and 143/92. Does not check BP at home. Prescribed Norvasc  5 mg daily but has not been taking this as it makes him drowsy. Will start hydrochlorothiazide  12.5 mg daily as he has tolerated this well in this past. Will obtain BP cuff and return in one month for CMP.

## 2024-08-05 NOTE — Assessment & Plan Note (Signed)
 States he had a few drinks over Thanksgiving but other than that has not had a drink in 5 months. This is not reflected on 10/31 note where there was active drinking and inebriation at work per his report. He states that is incorrect. Repeatedly asks for Librium  for cravings. I counseled him extensively on how that is not  a good regimen. He was insisted on getting Librium . I stated that we can try Acamprosate  as he does not think Naltrexone  was helpful in the past. Start 666 mg TID. Follow-up next month.

## 2024-08-05 NOTE — Assessment & Plan Note (Addendum)
 Prescribed Lipitor but has not been taking this. LDL 10/31 was 104 with goal < 100. PER ID, Lipitor preferable as less interaction with HIV meds. Will refill Lipitor today and check CMP next month.

## 2024-08-06 NOTE — Progress Notes (Signed)
 Internal Medicine Clinic Attending  Case discussed with the resident at the time of the visit.  We reviewed the resident's history and exam and pertinent patient test results.  I agree with the assessment, diagnosis, and plan of care documented in the resident's note.

## 2024-09-05 ENCOUNTER — Ambulatory Visit: Payer: Self-pay

## 2024-09-05 ENCOUNTER — Other Ambulatory Visit: Payer: Self-pay

## 2024-10-08 ENCOUNTER — Ambulatory Visit: Payer: Self-pay | Admitting: Student
